# Patient Record
Sex: Female | Born: 1965
Health system: Southern US, Community
[De-identification: ages and names within clinical notes are randomized; demographics above are authoritative.]

## PROBLEM LIST (undated history)

## (undated) DIAGNOSIS — R7302 Impaired glucose tolerance (oral): Secondary | ICD-10-CM

## (undated) DIAGNOSIS — I10 Essential (primary) hypertension: Secondary | ICD-10-CM

## (undated) DIAGNOSIS — E785 Hyperlipidemia, unspecified: Secondary | ICD-10-CM

## (undated) DIAGNOSIS — A539 Syphilis, unspecified: Secondary | ICD-10-CM

## (undated) DIAGNOSIS — Z8619 Personal history of other infectious and parasitic diseases: Secondary | ICD-10-CM

## (undated) HISTORY — DX: Syphilis, unspecified: A53.9

## (undated) HISTORY — DX: Impaired glucose tolerance (oral): R73.02

## (undated) HISTORY — DX: Essential (primary) hypertension: I10

## (undated) HISTORY — DX: Personal history of other infectious and parasitic diseases: Z86.19

## (undated) HISTORY — DX: Hyperlipidemia, unspecified: E78.5

---

## 1989-12-31 HISTORY — PX: TUBAL LIGATION: SHX77

## 1999-06-14 ENCOUNTER — Other Ambulatory Visit: Admission: RE | Admit: 1999-06-14 | Discharge: 1999-06-14 | Payer: Self-pay | Admitting: Obstetrics and Gynecology

## 2000-07-23 ENCOUNTER — Other Ambulatory Visit: Admission: RE | Admit: 2000-07-23 | Discharge: 2000-07-23 | Payer: Self-pay | Admitting: Gynecology

## 2000-08-18 ENCOUNTER — Encounter: Payer: Self-pay | Admitting: Gynecology

## 2000-08-18 ENCOUNTER — Ambulatory Visit (HOSPITAL_COMMUNITY): Admission: RE | Admit: 2000-08-18 | Discharge: 2000-08-18 | Payer: Self-pay | Admitting: Gynecology

## 2001-07-24 ENCOUNTER — Other Ambulatory Visit: Admission: RE | Admit: 2001-07-24 | Discharge: 2001-07-24 | Payer: Self-pay | Admitting: Gynecology

## 2002-07-28 ENCOUNTER — Other Ambulatory Visit: Admission: RE | Admit: 2002-07-28 | Discharge: 2002-07-28 | Payer: Self-pay | Admitting: Gynecology

## 2003-08-23 ENCOUNTER — Other Ambulatory Visit: Admission: RE | Admit: 2003-08-23 | Discharge: 2003-08-23 | Payer: Self-pay | Admitting: Gynecology

## 2004-03-31 ENCOUNTER — Other Ambulatory Visit: Admission: RE | Admit: 2004-03-31 | Discharge: 2004-03-31 | Payer: Self-pay | Admitting: Gynecology

## 2004-10-13 ENCOUNTER — Other Ambulatory Visit: Admission: RE | Admit: 2004-10-13 | Discharge: 2004-10-13 | Payer: Self-pay | Admitting: Gynecology

## 2005-08-07 ENCOUNTER — Ambulatory Visit: Payer: Self-pay | Admitting: Internal Medicine

## 2005-12-31 ENCOUNTER — Encounter (INDEPENDENT_AMBULATORY_CARE_PROVIDER_SITE_OTHER): Payer: Self-pay | Admitting: *Deleted

## 2005-12-31 LAB — CONVERTED CEMR LAB

## 2006-08-30 ENCOUNTER — Ambulatory Visit: Payer: Self-pay | Admitting: Internal Medicine

## 2006-09-27 ENCOUNTER — Ambulatory Visit: Payer: Self-pay | Admitting: Internal Medicine

## 2007-04-03 ENCOUNTER — Ambulatory Visit: Payer: Self-pay | Admitting: Internal Medicine

## 2007-07-07 ENCOUNTER — Ambulatory Visit: Payer: Self-pay | Admitting: Internal Medicine

## 2007-09-13 ENCOUNTER — Encounter: Payer: Self-pay | Admitting: *Deleted

## 2007-09-13 DIAGNOSIS — I10 Essential (primary) hypertension: Secondary | ICD-10-CM | POA: Insufficient documentation

## 2007-09-13 DIAGNOSIS — E669 Obesity, unspecified: Secondary | ICD-10-CM

## 2007-09-13 DIAGNOSIS — Z8619 Personal history of other infectious and parasitic diseases: Secondary | ICD-10-CM

## 2007-09-13 DIAGNOSIS — A539 Syphilis, unspecified: Secondary | ICD-10-CM

## 2007-09-13 HISTORY — DX: Personal history of other infectious and parasitic diseases: Z86.19

## 2007-09-13 HISTORY — DX: Essential (primary) hypertension: I10

## 2007-09-13 HISTORY — DX: Syphilis, unspecified: A53.9

## 2008-04-06 ENCOUNTER — Ambulatory Visit: Payer: Self-pay | Admitting: Internal Medicine

## 2008-04-07 LAB — CONVERTED CEMR LAB
ALT: 28 units/L (ref 0–35)
AST: 24 units/L (ref 0–37)
Albumin: 3.8 g/dL (ref 3.5–5.2)
Alkaline Phosphatase: 72 units/L (ref 39–117)
BUN: 15 mg/dL (ref 6–23)
Basophils Absolute: 0 10*3/uL (ref 0.0–0.1)
Basophils Relative: 0.2 % (ref 0.0–1.0)
Bilirubin Urine: NEGATIVE
Bilirubin, Direct: 0.1 mg/dL (ref 0.0–0.3)
CO2: 33 meq/L — ABNORMAL HIGH (ref 19–32)
Calcium: 9.2 mg/dL (ref 8.4–10.5)
Chloride: 104 meq/L (ref 96–112)
Cholesterol: 196 mg/dL (ref 0–200)
Creatinine, Ser: 1.3 mg/dL — ABNORMAL HIGH (ref 0.4–1.2)
Eosinophils Absolute: 0 10*3/uL (ref 0.0–0.7)
Eosinophils Relative: 0.7 % (ref 0.0–5.0)
GFR calc Af Amer: 58 mL/min
GFR calc non Af Amer: 48 mL/min
Glucose, Bld: 120 mg/dL — ABNORMAL HIGH (ref 70–99)
HCT: 37.8 % (ref 36.0–46.0)
HDL: 47.5 mg/dL (ref >39.0)
Hemoglobin, Urine: NEGATIVE
Hemoglobin: 12.7 g/dL (ref 12.0–15.0)
Hgb A1c MFr Bld: 5.6 % (ref 4.6–6.0)
Ketones, ur: NEGATIVE mg/dL
LDL Cholesterol: 133 mg/dL — ABNORMAL HIGH (ref 0–99)
Leukocytes, UA: NEGATIVE
Lymphocytes Relative: 49.1 % — ABNORMAL HIGH (ref 12.0–46.0)
MCHC: 33.5 g/dL (ref 30.0–36.0)
MCV: 82.8 fL (ref 78.0–100.0)
Monocytes Absolute: 0.2 10*3/uL (ref 0.1–1.0)
Monocytes Relative: 6.1 % (ref 3.0–12.0)
Neutro Abs: 1.6 10*3/uL (ref 1.4–7.7)
Neutrophils Relative %: 43.9 % (ref 43.0–77.0)
Nitrite: NEGATIVE
Platelets: 247 10*3/uL (ref 150–400)
Potassium: 3 meq/L — ABNORMAL LOW (ref 3.5–5.1)
RBC: 4.57 M/uL (ref 3.87–5.11)
RDW: 12.2 % (ref 11.5–14.6)
Sodium: 143 meq/L (ref 135–145)
Specific Gravity, Urine: 1.015 (ref 1.000–1.03)
TSH: 0.63 microintl units/mL (ref 0.35–5.50)
Total Bilirubin: 0.8 mg/dL (ref 0.3–1.2)
Total CHOL/HDL Ratio: 4.1
Total Protein, Urine: NEGATIVE mg/dL
Total Protein: 7 g/dL (ref 6.0–8.3)
Triglycerides: 79 mg/dL (ref 0–149)
Urine Glucose: NEGATIVE mg/dL
Urobilinogen, UA: 0.2 (ref 0.0–1.0)
VLDL: 16 mg/dL (ref 0–40)
WBC: 3.7 10*3/uL — ABNORMAL LOW (ref 4.5–10.5)
pH: 6 (ref 5.0–8.0)

## 2009-03-08 ENCOUNTER — Encounter (INDEPENDENT_AMBULATORY_CARE_PROVIDER_SITE_OTHER): Payer: Self-pay | Admitting: Family Medicine

## 2009-04-26 ENCOUNTER — Ambulatory Visit: Payer: Self-pay | Admitting: Internal Medicine

## 2009-04-26 DIAGNOSIS — E739 Lactose intolerance, unspecified: Secondary | ICD-10-CM | POA: Insufficient documentation

## 2009-04-26 LAB — CONVERTED CEMR LAB
ALT: 19 units/L (ref 0–35)
AST: 20 units/L (ref 0–37)
Albumin: 3.7 g/dL (ref 3.5–5.2)
Alkaline Phosphatase: 79 units/L (ref 39–117)
BUN: 19 mg/dL (ref 6–23)
Basophils Absolute: 0 10*3/uL (ref 0.0–0.1)
Basophils Relative: 0.1 % (ref 0.0–3.0)
Bilirubin Urine: NEGATIVE
Bilirubin, Direct: 0.1 mg/dL (ref 0.0–0.3)
CO2: 32 meq/L (ref 19–32)
Calcium: 8.9 mg/dL (ref 8.4–10.5)
Chloride: 106 meq/L (ref 96–112)
Cholesterol: 189 mg/dL (ref 0–200)
Creatinine, Ser: 1.2 mg/dL (ref 0.4–1.2)
Eosinophils Absolute: 0 10*3/uL (ref 0.0–0.7)
Eosinophils Relative: 0.7 % (ref 0.0–5.0)
GFR calc non Af Amer: 63.25 mL/min (ref >60)
Glucose, Bld: 138 mg/dL — ABNORMAL HIGH (ref 70–99)
HCT: 35.4 % — ABNORMAL LOW (ref 36.0–46.0)
HDL: 45.4 mg/dL (ref >39.00)
Hemoglobin, Urine: NEGATIVE
Hemoglobin: 12.4 g/dL (ref 12.0–15.0)
Hgb A1c MFr Bld: 5.7 % (ref 4.6–6.5)
Ketones, ur: NEGATIVE mg/dL
LDL Cholesterol: 121 mg/dL — ABNORMAL HIGH (ref 0–99)
Leukocytes, UA: NEGATIVE
Lymphocytes Relative: 46.6 % — ABNORMAL HIGH (ref 12.0–46.0)
Lymphs Abs: 2.3 10*3/uL (ref 0.7–4.0)
MCHC: 35.1 g/dL (ref 30.0–36.0)
MCV: 83.6 fL (ref 78.0–100.0)
Monocytes Absolute: 0.3 10*3/uL (ref 0.1–1.0)
Monocytes Relative: 5.8 % (ref 3.0–12.0)
Neutro Abs: 2.2 10*3/uL (ref 1.4–7.7)
Neutrophils Relative %: 46.8 % (ref 43.0–77.0)
Nitrite: NEGATIVE
Platelets: 203 10*3/uL (ref 150.0–400.0)
Potassium: 3.1 meq/L — ABNORMAL LOW (ref 3.5–5.1)
RBC: 4.24 M/uL (ref 3.87–5.11)
RDW: 11.9 % (ref 11.5–14.6)
Sodium: 143 meq/L (ref 135–145)
Specific Gravity, Urine: 1.015 (ref 1.000–1.030)
TSH: 1.51 microintl units/mL (ref 0.35–5.50)
Total Bilirubin: 0.8 mg/dL (ref 0.3–1.2)
Total CHOL/HDL Ratio: 4
Total Protein, Urine: NEGATIVE mg/dL
Total Protein: 6.6 g/dL (ref 6.0–8.3)
Triglycerides: 112 mg/dL (ref 0.0–149.0)
Urine Glucose: NEGATIVE mg/dL
Urobilinogen, UA: 0.2 (ref 0.0–1.0)
VLDL: 22.4 mg/dL (ref 0.0–40.0)
WBC: 4.8 10*3/uL (ref 4.5–10.5)
pH: 6 (ref 5.0–8.0)

## 2009-04-27 ENCOUNTER — Telehealth: Payer: Self-pay | Admitting: Internal Medicine

## 2009-04-27 LAB — CONVERTED CEMR LAB: Vit D, 25-Hydroxy: 43 ng/mL (ref 30–89)

## 2009-09-09 ENCOUNTER — Telehealth (INDEPENDENT_AMBULATORY_CARE_PROVIDER_SITE_OTHER): Payer: Self-pay | Admitting: Internal Medicine

## 2010-06-07 ENCOUNTER — Ambulatory Visit: Payer: Self-pay | Admitting: Internal Medicine

## 2010-06-07 DIAGNOSIS — J019 Acute sinusitis, unspecified: Secondary | ICD-10-CM | POA: Insufficient documentation

## 2010-06-08 LAB — CONVERTED CEMR LAB
ALT: 22 units/L (ref 0–35)
AST: 21 units/L (ref 0–37)
Albumin: 3.9 g/dL (ref 3.5–5.2)
Alkaline Phosphatase: 68 units/L (ref 39–117)
BUN: 17 mg/dL (ref 6–23)
Basophils Absolute: 0 10*3/uL (ref 0.0–0.1)
Basophils Relative: 0.6 % (ref 0.0–3.0)
Bilirubin Urine: NEGATIVE
Bilirubin, Direct: 0.1 mg/dL (ref 0.0–0.3)
CO2: 33 meq/L — ABNORMAL HIGH (ref 19–32)
Calcium: 9 mg/dL (ref 8.4–10.5)
Chloride: 102 meq/L (ref 96–112)
Cholesterol: 179 mg/dL (ref 0–200)
Creatinine, Ser: 1.2 mg/dL (ref 0.4–1.2)
Eosinophils Absolute: 0.1 10*3/uL (ref 0.0–0.7)
Eosinophils Relative: 0.9 % (ref 0.0–5.0)
GFR calc non Af Amer: 62.32 mL/min (ref >60)
Glucose, Bld: 86 mg/dL (ref 70–99)
HCT: 34 % — ABNORMAL LOW (ref 36.0–46.0)
HDL: 36.4 mg/dL — ABNORMAL LOW (ref >39.00)
Hemoglobin, Urine: NEGATIVE
Hemoglobin: 11.9 g/dL — ABNORMAL LOW (ref 12.0–15.0)
Hgb A1c MFr Bld: 5.7 % (ref 4.6–6.5)
Ketones, ur: NEGATIVE mg/dL
LDL Cholesterol: 115 mg/dL — ABNORMAL HIGH (ref 0–99)
Leukocytes, UA: NEGATIVE
Lymphocytes Relative: 40.8 % (ref 12.0–46.0)
Lymphs Abs: 2.8 10*3/uL (ref 0.7–4.0)
MCHC: 35.1 g/dL (ref 30.0–36.0)
MCV: 82.7 fL (ref 78.0–100.0)
Monocytes Absolute: 0.4 10*3/uL (ref 0.1–1.0)
Monocytes Relative: 5.4 % (ref 3.0–12.0)
Neutro Abs: 3.6 10*3/uL (ref 1.4–7.7)
Neutrophils Relative %: 52.3 % (ref 43.0–77.0)
Nitrite: NEGATIVE
Platelets: 280 10*3/uL (ref 150.0–400.0)
Potassium: 3.4 meq/L — ABNORMAL LOW (ref 3.5–5.1)
RBC: 4.11 M/uL (ref 3.87–5.11)
RDW: 12.9 % (ref 11.5–14.6)
Sodium: 144 meq/L (ref 135–145)
Specific Gravity, Urine: 1.02 (ref 1.000–1.030)
TSH: 2.13 microintl units/mL (ref 0.35–5.50)
Total Bilirubin: 0.9 mg/dL (ref 0.3–1.2)
Total CHOL/HDL Ratio: 5
Total Protein, Urine: NEGATIVE mg/dL
Total Protein: 7.5 g/dL (ref 6.0–8.3)
Triglycerides: 140 mg/dL (ref 0.0–149.0)
Urine Glucose: NEGATIVE mg/dL
Urobilinogen, UA: 1 (ref 0.0–1.0)
VLDL: 28 mg/dL (ref 0.0–40.0)
WBC: 6.8 10*3/uL (ref 4.5–10.5)
pH: 6 (ref 5.0–8.0)

## 2011-01-30 NOTE — Assessment & Plan Note (Signed)
Summary: FU  MED  STC   Vital Signs:  Patient profile:   45 year old female Height:      65 inches Weight:      181.75 pounds BMI:     30.35 O2 Sat:      98 % on Room air Temp:     98.5 degrees F oral Pulse rate:   59 / minute BP sitting:   112 / 60  (left arm) Cuff size:   regular  Vitals Entered ByZella Ball Ewing (June 07, 2010 4:29 PM)  O2 Flow:  Room air  CC: Medication refills, congestion/RE   CC:  Medication refills and congestion/RE.  History of Present Illness: here for med refills, incidently with facial pain, pressure, fever  adn greenish d/c;  Pt denies CP, sob, doe, wheezing, orthopnea, pnd, worsening LE edema, palps, dizziness or syncope  Pt denies new neuro symptoms such as headache, facial or extremity weakness   Good compliance with meds, good tolerability.    Preventive Screening-Counseling & Management      Drug Use:  no.    Problems Prior to Update: 1)  Sinusitis- Acute-nos  (ICD-461.9) 2)  Glucose Intolerance  (ICD-271.3) 3)  Preventive Health Care  (ICD-V70.0) 4)  Preventive Health Care  (ICD-V70.0) 5)  Obesity Nos  (ICD-278.00) 6)  Tubal Ligation, Hx of  (ICD-V26.51) 7)  Chlamydial Infection, Hx of  (ICD-V12.09) 8)  Hx of Syphilis  (ICD-097.9) 9)  Hypertension  (ICD-401.9)  Medications Prior to Update: 1)  Azor 5-40 Mg  Tabs (Amlodipine-Olmesartan) .... Take One Tablet Daily 2)  Hydrochlorothiazide 25 Mg  Tabs (Hydrochlorothiazide) .... Take One Tablet Once Daily 3)  Adult Aspirin Low Strength 81 Mg  Tbdp (Aspirin) .Marland Kitchen.. 1 By Mouth Qd 4)  Klor-Con 10 10 Meq  Tbcr (Potassium Chloride) .Marland Kitchen.. 1 By Mouth Once Daily  Current Medications (verified): 1)  Tribenzor 40-5-25 Mg Tabs (Olmesartan-Amlodipine-Hctz) .Marland Kitchen.. 1po Once Daily 2)  Adult Aspirin Low Strength 81 Mg  Tbdp (Aspirin) .Marland Kitchen.. 1 By Mouth Qd 3)  Klor-Con 10 10 Meq  Tbcr (Potassium Chloride) .Marland Kitchen.. 1 By Mouth Once Daily 4)  Azithromycin 250 Mg Tabs (Azithromycin) .... 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         all otherwise negative per pt -    Physical Exam  General:  alert and overweight-appearing.  , mild ill  Head:  normocephalic and atraumatic.   Eyes:  vision grossly intact, pupils equal, and pupils round.   Ears:  bilat tm's mild red, sinus tender bilat Nose:  nasal dischargemucosal pallor and mucosal edema.   Mouth:  pharyngeal erythema and fair dentition.   Neck:  supple and cervical lymphadenopathy.   Lungs:  normal respiratory effort and normal breath sounds.   Heart:  normal rate and regular rhythm.   Abdomen:  soft, non-tender, and normal bowel sounds.   Msk:  no joint tenderness and no joint swelling.   Extremities:  no edema, no erythema  Neurologic:  cranial  nerves II-XII intact and strength normal in all extremities.   Skin:  color normal and no rashes.   Psych:  not anxious appearing and not depressed appearing.     Impression & Recommendations:  Problem # 1:  Preventive Health Care (ICD-V70.0) Overall doing well, age appropriate education and counseling updated and referral for appropriate preventive services done unless declined, immunizations up to date or declined, diet counseling done if overweight, urged to quit smoking if smokes , most recent labs reviewed and current ordered if appropriate, ecg reviewed or declined (interpretation per ECG scanned in the EMR if done); information regarding Medicare Prevention requirements given if appropriate; speciality referrals updated as appropriate  Orders: TLB-BMP (Basic Metabolic Panel-BMET) (80048-METABOL) TLB-CBC Platelet - w/Differential (85025-CBCD) TLB-Hepatic/Liver Function Pnl (80076-HEPATIC) TLB-Lipid Panel (80061-LIPID) TLB-TSH (Thyroid Stimulating Hormone) (84443-TSH) TLB-Udip ONLY (81003-UDIP)  Problem # 2:  HYPERTENSION (ICD-401.9)  The following medications were removed from the medication list:    Hydrochlorothiazide 25 Mg Tabs (Hydrochlorothiazide) .Marland Kitchen... Take one tablet once daily Her updated medication list for this problem includes:    Tribenzor 40-5-25 Mg Tabs (Olmesartan-amlodipine-hctz) .Marland Kitchen... 1po once daily change to the tribenzor  BP today: 112/60 Prior BP: 128/102 (04/26/2009)  Labs Reviewed: K+: 3.1 (04/26/2009) Creat: : 1.2 (04/26/2009)   Chol: 189 (04/26/2009)   HDL: 45.40 (04/26/2009)   LDL: 121 (04/26/2009)   TG: 112.0 (04/26/2009)  Problem # 3:  SINUSITIS- ACUTE-NOS (ICD-461.9)  Her updated medication list for this problem includes:    Azithromycin 250 Mg Tabs (Azithromycin) .Marland Kitchen... 2po qd for 1 day, then 1po qd for 4days, then stop treat as above, f/u any worsening signs or symptoms   Complete Medication List: 1)  Tribenzor 40-5-25 Mg Tabs  (Olmesartan-amlodipine-hctz) .Marland Kitchen.. 1po once daily 2)  Adult Aspirin Low Strength 81 Mg Tbdp (Aspirin) .Marland Kitchen.. 1 by mouth qd 3)  Klor-con 10 10 Meq Tbcr (Potassium chloride) .Marland Kitchen.. 1 by mouth once daily 4)  Azithromycin 250 Mg Tabs (Azithromycin) .... 2po qd for 1 day, then 1po qd for 4days, then stop  Other Orders: TLB-A1C / Hgb A1C (Glycohemoglobin) (83036-A1C)  Patient Instructions: 1)  stop the azor and the HCTZ fluid pill when you run out 2)  after that, start the tribenzor samples, and the new prescriptions 3)  Please go to the Lab in the basement for your blood and/or urine tests today 4)  Please take all new medications as prescribed - the anitbiotic 5)  Continue all previous medications as before this visit except for the above 6)  Please call for your yearly mammogram 7)  Please schedule a follow-up appointment in 1 year or sooner if needed Prescriptions: AZITHROMYCIN 250 MG TABS (AZITHROMYCIN) 2po qd for 1 day, then 1po qd for 4days, then stop  #6 x 1   Entered and Authorized by:   Corwin Levins MD   Signed by:   Corwin Levins MD on 06/07/2010   Method used:   Print then Give to Patient   RxID:  [id number] KLOR-CON 10 10 MEQ  TBCR (POTASSIUM CHLORIDE) 1 by mouth once daily  #90 x 3   Entered and Authorized by:   Corwin Levins MD   Signed by:   Corwin Levins MD on 06/07/2010   Method used:   Print then Give to Patient   RxID:   [id number] TRIBENZOR 40-5-25 MG TABS (OLMESARTAN-AMLODIPINE-HCTZ) 1po once daily  #90 x 3   Entered and Authorized by:   Corwin Levins MD   Signed by:   Corwin Levins MD on 06/07/2010   Method used:   Print then Give to Patient   RxID:   [id number] KGURKYHCW 40-5-25 MG TABS (OLMESARTAN-AMLODIPINE-HCTZ) 1po once daily  #30 x 11   Entered and Authorized by:   Corwin Levins MD   Signed by:   Corwin Levins MD on 06/07/2010   Method used:   Print then Give to Patient   RxID:   [id number]

## 2011-06-30 ENCOUNTER — Other Ambulatory Visit: Payer: Self-pay | Admitting: Internal Medicine

## 2011-07-02 MED ORDER — OLMESARTAN-AMLODIPINE-HCTZ 40-5-25 MG PO TABS: 1.0000 | ORAL_TABLET | Freq: Every day | ORAL | Status: DC

## 2011-07-02 NOTE — Telephone Encounter (Signed)
Rx Done w/ note that Pt needs f/u OV.

## 2011-07-06 ENCOUNTER — Encounter: Payer: Self-pay | Admitting: Internal Medicine

## 2011-07-07 ENCOUNTER — Other Ambulatory Visit: Payer: Self-pay | Admitting: Internal Medicine

## 2011-07-08 ENCOUNTER — Encounter: Payer: Self-pay | Admitting: Internal Medicine

## 2011-07-08 DIAGNOSIS — R7302 Impaired glucose tolerance (oral): Secondary | ICD-10-CM

## 2011-07-08 DIAGNOSIS — E119 Type 2 diabetes mellitus without complications: Secondary | ICD-10-CM | POA: Insufficient documentation

## 2011-07-08 HISTORY — DX: Impaired glucose tolerance (oral): R73.02

## 2011-07-09 ENCOUNTER — Ambulatory Visit (INDEPENDENT_AMBULATORY_CARE_PROVIDER_SITE_OTHER): Payer: Self-pay | Admitting: Internal Medicine

## 2011-07-09 ENCOUNTER — Encounter: Payer: Self-pay | Admitting: Internal Medicine

## 2011-07-09 VITALS — BP 140/92 | HR 54 | Temp 98.0°F | Ht 65.0 in | Wt 203.2 lb

## 2011-07-09 DIAGNOSIS — R7309 Other abnormal glucose: Secondary | ICD-10-CM

## 2011-07-09 DIAGNOSIS — R7302 Impaired glucose tolerance (oral): Secondary | ICD-10-CM

## 2011-07-09 DIAGNOSIS — I10 Essential (primary) hypertension: Secondary | ICD-10-CM

## 2011-07-09 DIAGNOSIS — Z0000 Encounter for general adult medical examination without abnormal findings: Secondary | ICD-10-CM

## 2011-07-09 MED ORDER — POTASSIUM CHLORIDE 10 MEQ PO TBCR: 10.0000 meq | EXTENDED_RELEASE_TABLET | Freq: Every day | ORAL | Status: DC

## 2011-07-09 MED ORDER — OLMESARTAN-AMLODIPINE-HCTZ 40-5-25 MG PO TABS: ORAL_TABLET | ORAL | Status: DC

## 2011-07-09 NOTE — Assessment & Plan Note (Signed)
stable overall by hx and exam, most recent data reviewed with pt, and pt to continue medical treatment as before  Lab Results  Component Value Date   HGBA1C 5.7 06/07/2010

## 2011-07-09 NOTE — Progress Notes (Signed)
  Subjective:    Patient ID: Martha Alvarado, female    DOB: [birth year], 45 y.o.   MRN: [id number]  HPI Here to f/u; overall doing ok,  Pt denies chest pain, increased sob or doe, wheezing, orthopnea, PND, increased LE swelling, palpitations, dizziness or syncope.  Pt denies new neurological symptoms such as new headache, or facial or extremity weakness or numbness   Pt denies polydipsia, polyuria,   Pt states overall good compliance with meds, trying to follow lower cholesterol wt overall stable but little exercise however.  Pt denies fever, wt loss, night sweats, loss of appetite, or other constitutional symptoms  Overall good compliance with treatment, and good medicine tolerability, except did run out of med in the last 2 days. Past Medical History  Diagnosis Date  . CHLAMYDIAL INFECTION, HX OF 09/13/2007  . GLUCOSE INTOLERANCE 04/26/2009  . HYPERTENSION 09/13/2007  . OBESITY NOS 09/13/2007  . SINUSITIS- ACUTE-NOS 06/07/2010  . SYPHILIS 09/13/2007  . Impaired glucose tolerance 07/08/2011   Past Surgical History  Procedure Date  . Tubal ligation     reports that she has never smoked. She does not have any smokeless tobacco history on file. She reports that she does not drink alcohol or use illicit drugs. family history includes Diabetes in her other and Hypertension in her other. No Known Allergies Current Outpatient Prescriptions on File Prior to Visit  Medication Sig Dispense Refill  . aspirin 81 MG tablet Take 81 mg by mouth daily.        Marland Kitchen DISCONTD: potassium chloride (KLOR-CON) 10 MEQ CR tablet Take 10 mEq by mouth daily.        Marland Kitchen DISCONTD: TRIBENZOR 40-5-25 MG TABS TAKE 1 TABLET BY MOUTH ONCE DAILY  30 tablet  1   Review of Systems Review of Systems  Constitutional: Negative for diaphoresis and unexpected weight change.  HENT: Negative for drooling and tinnitus.   Eyes: Negative for photophobia and visual disturbance.  Respiratory: Negative for choking and stridor.     Gastrointestinal: Negative for vomiting and blood in stool.  Genitourinary: Negative for hematuria and decreased urine volume.        Objective:   Physical Exam BP 140/92  Pulse 54  Temp(Src) 98 F (36.7 C) (Oral)  Ht 5\' 5"  (1.651 m)  Wt 203 lb 4 oz (92.194 kg)  BMI 33.82 kg/m2  SpO2 98% Physical Exam  VS noted Constitutional: Pt appears well-developed and obses  HENT: Head: Normocephalic.  Right Ear: External ear normal.  Left Ear: External ear normal.  Eyes: Conjunctivae and EOM are normal. Pupils are equal, round, and reactive to light.  Neck: Normal range of motion. Neck supple.  Cardiovascular: Normal rate and regular rhythm.   Pulmonary/Chest: Effort normal and breath sounds normal.  Abd:  Soft, NT, non-distended, + BS Neurological: Pt is alert. No cranial nerve deficit.  Skin: Skin is warm. No erythema.  Psychiatric: Pt behavior is normal. Thought content normal.         Assessment & Plan:

## 2011-07-09 NOTE — Assessment & Plan Note (Signed)
stable overall by hx and exam, most recent data reviewed with pt, and pt to continue medical treatment as before  Lab Results  Component Value Date   WBC 6.8 06/07/2010   HGB 11.9* 06/07/2010   HCT 34.0* 06/07/2010   PLT 280.0 06/07/2010   CHOL 179 06/07/2010   TRIG 140.0 06/07/2010   HDL 36.40* 06/07/2010   ALT 22 06/07/2010   AST 21 06/07/2010   NA 144 06/07/2010   K 3.4* 06/07/2010   CL 102 06/07/2010   CREATININE 1.2 06/07/2010   BUN 17 06/07/2010   CO2 33* 06/07/2010   TSH 2.13 06/07/2010   HGBA1C 5.7 06/07/2010

## 2011-07-09 NOTE — Patient Instructions (Signed)
You are given the BP medication samples today  - 1 month, and prescription Continue all other medications as before Please return in 3 mo with Lab testing done 3-5 days before

## 2011-10-11 ENCOUNTER — Ambulatory Visit: Payer: Self-pay | Admitting: Internal Medicine

## 2011-10-29 ENCOUNTER — Ambulatory Visit: Payer: Self-pay | Admitting: Internal Medicine

## 2011-11-27 ENCOUNTER — Ambulatory Visit: Payer: Self-pay | Admitting: Internal Medicine

## 2011-12-17 ENCOUNTER — Ambulatory Visit: Payer: Self-pay | Admitting: Internal Medicine

## 2012-02-21 ENCOUNTER — Emergency Department (INDEPENDENT_AMBULATORY_CARE_PROVIDER_SITE_OTHER)
Admission: EM | Admit: 2012-02-21 | Discharge: 2012-02-21 | Disposition: A | Discharge status: Home Health | Payer: Self-pay | Source: Home / Self Care | Attending: Family Medicine | Admitting: Family Medicine

## 2012-02-21 ENCOUNTER — Encounter (HOSPITAL_COMMUNITY): Payer: Self-pay | Admitting: Emergency Medicine

## 2012-02-21 DIAGNOSIS — N390 Urinary tract infection, site not specified: Secondary | ICD-10-CM

## 2012-02-21 DIAGNOSIS — N760 Acute vaginitis: Secondary | ICD-10-CM

## 2012-02-21 LAB — POCT URINALYSIS DIP (DEVICE)
Protein, ur: 100 mg/dL — AB
Specific Gravity, Urine: 1.025 (ref 1.005–1.030)
Urobilinogen, UA: 1 mg/dL (ref 0.0–1.0)
pH: 6 (ref 5.0–8.0)

## 2012-02-21 LAB — CBC
Hemoglobin: 13.5 g/dL (ref 12.0–15.0)
RBC: 4.7 MIL/uL (ref 3.87–5.11)
WBC: 5.7 10*3/uL (ref 4.0–10.5)

## 2012-02-21 LAB — WET PREP, GENITAL: Clue Cells Wet Prep HPF POC: NONE SEEN

## 2012-02-21 MED ORDER — CIPROFLOXACIN HCL 750 MG PO TABS: 750.0000 mg | ORAL_TABLET | Freq: Two times a day (BID) | ORAL | Status: AC

## 2012-02-21 MED ORDER — IBUPROFEN 800 MG PO TABS
800.0000 mg | ORAL_TABLET | Freq: Once | ORAL | Status: AC
  Administered 2012-02-21: 800 mg via ORAL

## 2012-02-21 MED ORDER — IBUPROFEN 800 MG PO TABS
ORAL_TABLET | ORAL | Status: AC
  Filled 2012-02-21: qty 1

## 2012-02-21 MED ORDER — METRONIDAZOLE 500 MG PO TABS: 500.0000 mg | ORAL_TABLET | Freq: Two times a day (BID) | ORAL | Status: AC

## 2012-02-21 MED ORDER — CEFTRIAXONE SODIUM 1 G IJ SOLR
1.0000 g | Freq: Once | INTRAMUSCULAR | Status: AC
  Administered 2012-02-21: 1 g via INTRAMUSCULAR

## 2012-02-21 MED ORDER — CEFTRIAXONE SODIUM 1 G IJ SOLR
INTRAMUSCULAR | Status: AC
  Filled 2012-02-21: qty 10

## 2012-02-21 NOTE — ED Provider Notes (Cosign Needed Addendum)
History     CSN: [id number]  Arrival date & time 02/21/12  1059   First MD Initiated Contact with Patient 02/21/12 1135      Chief Complaint  Patient presents with  . URI  . Vaginal Discharge    (Consider location/radiation/quality/duration/timing/severity/associated sxs/prior treatment) HPI Comments: The patient reports having runny nose and congestion x 3 days. Developed a headache yesterday. No vomiting. No blurred vision. Admits to cough that is dry. Fever started today. Denies uti symptoms but does not a vaginal discharge x 3-4 days. Is sexually active.   The history is provided by the patient.    Past Medical History  Diagnosis Date  . CHLAMYDIAL INFECTION, HX OF 09/13/2007  . GLUCOSE INTOLERANCE 04/26/2009  . HYPERTENSION 09/13/2007  . OBESITY NOS 09/13/2007  . SINUSITIS- ACUTE-NOS 06/07/2010  . SYPHILIS 09/13/2007  . Impaired glucose tolerance 07/08/2011    Past Surgical History  Procedure Date  . Tubal ligation     Family History  Problem Relation Age of Onset  . Diabetes Other   . Hypertension Other     History  Substance Use Topics  . Smoking status: Never Smoker   . Smokeless tobacco: Not on file  . Alcohol Use: No    OB History    Grav Para Term Preterm Abortions TAB SAB Ect Mult Living                  Review of Systems  Constitutional: Positive for fever and fatigue.  HENT: Positive for congestion, rhinorrhea, postnasal drip and sinus pressure. Negative for sore throat.   Respiratory: Positive for cough. Negative for wheezing.   Gastrointestinal: Negative.   Genitourinary: Positive for vaginal discharge and vaginal pain.  Musculoskeletal: Positive for arthralgias.  Skin: Negative.   Neurological: Positive for headaches.    Allergies  Review of patient's allergies indicates no known allergies.  Home Medications   Current Outpatient Rx  Name Route Sig Dispense Refill  . ASPIRIN 81 MG PO TABS Oral Take 81 mg by mouth daily.      Marland Kitchen  CIPROFLOXACIN HCL 750 MG PO TABS Oral Take 1 tablet (750 mg total) by mouth 2 (two) times daily. 14 tablet 0  . METRONIDAZOLE 500 MG PO TABS Oral Take 1 tablet (500 mg total) by mouth 2 (two) times daily. 14 tablet 0  . OLMESARTAN-AMLODIPINE-HCTZ 40-5-25 MG PO TABS  1 tab by mouth per day 90 tablet 3    Overdue for yearly physical must see md before add ...  . POTASSIUM CHLORIDE 10 MEQ PO TBCR Oral Take 1 tablet (10 mEq total) by mouth daily. 90 tablet 3    BP 124/71  Pulse 113  Temp(Src) 102 F (38.9 C) (Oral)  Resp 17  SpO2 100%  Physical Exam  Nursing note and vitals reviewed. Constitutional: She appears well-developed and well-nourished.  HENT:  Head: Normocephalic and atraumatic.       Ears clear, nose congested, frontal and maxillary tenderness. Throat clear  Neck: Normal range of motion. Neck supple. No thyromegaly present.  Cardiovascular: Regular rhythm and normal heart sounds.        Tachy due to fever  Pulmonary/Chest: Effort normal and breath sounds normal. She has no wheezes.  Abdominal: Soft. Bowel sounds are normal. She exhibits no distension. There is no tenderness. There is no rebound and no guarding.  Genitourinary:       Pelvic exam with female nursing personal savannah assisting reveals no skin or vulvar lesions. Thin frothy  white discharge. petechia on cervix. No cmt. No adenexal mass.   Lymphadenopathy:    She has no cervical adenopathy.    ED Course  Procedures (including critical care time)  Labs Reviewed  POCT URINALYSIS DIP (DEVICE) - Abnormal; Notable for the following:    Ketones, ur TRACE (*)    Hgb urine dipstick MODERATE (*)    Protein, ur 100 (*)    Leukocytes, UA LARGE (*) Biochemical Testing Only. Please order routine urinalysis from main lab if confirmatory testing is needed.   All other components within normal limits  WET PREP, GENITAL  GC/CHLAMYDIA PROBE AMP, GENITAL  URINE CULTURE  CBC   No results found.   1. UTI (lower urinary  tract infection)   2. Vaginitis       MDM        Randa Spike, MD 02/21/12 1344  Randa Spike, MD 02/21/12 [id number]

## 2012-02-21 NOTE — ED Notes (Signed)
PT HERE WITH COLD SX THAT STARTED X 3 DYS AGO WITH COUGH ,WHITE PHLEGM,CHILLSAND BODY ACHES AND TODAY FEVER 102.0.PT ALSO C/O YELLOWISH VAG D/C WITH ODOR AND LOWER BACK PAIN.DENIES N/V.LMP X 5 YRS AGO.PT HAS BEEN TAKING OTC COLD/COUGH MEDS BUT NOT WORKING

## 2012-02-21 NOTE — Discharge Instructions (Signed)
Tylenol or motrin as needed. Avoid caffeine and milk products. No intercourse x 10 days. Avoid caffeine and milk products. Will contact you with lab results and any new instructions if indicated. Cervicitis Cervicitis is a soreness and swelling (inflammation) of the cervix. Your cervix is located at the bottom of your uterus which opens up to the vagina.  CAUSES   Sexually transmitted infections (STIs).   Allergic reaction.   Medicines or birth control devices that are put in the vagina.   Injury to the cervix.   Bacterial infections.  SYMPTOMS  There may be no symptoms. If symptoms occur, they may include:  Grey, white, yellow, or bad smelling vaginal discharge.   Pain or itching of the area outside the vagina.   Painful sexual intercourse.   Lower abdominal or lower back pain, especially during intercourse.   Frequent urination.   Abnormal vaginal bleeding between periods, after sexual intercourse, or after menopause.   Pressure or a heavy feeling in the pelvis.  DIAGNOSIS  Diagnosis is made after a pelvic exam. Other tests may include:  Examination of any discharge under a microscope (wet prep).   A Pap test.  TREATMENT  Treatment will depend on the cause of cervicitis. If it is caused by an STI, both you and your partner will need to be treated. Antibiotic medicines will be given. HOME CARE INSTRUCTIONS   Do not have sexual intercourse until your caregiver says it is okay.   Do not have sexual intercourse until your partner has been treated if your cervicitis is caused by an STI.   Take your antibiotics as directed. Finish them even if you start to feel better.  SEEK IMMEDIATE MEDICAL CARE IF:   Your symptoms come back.   You have a fever.   You experience any problems that may be related to the medicine you are taking.  MAKE SURE YOU:   Understand these instructions.   Will watch your condition.   Will get help right away if you are not doing well or get  worse.  Document Released: 12/17/2005 Document Revised: 08/29/2011 Document Reviewed: 07/16/2011 [facility] Patient Information 2012 [place], [place].Cervicitis Cervicitis is a soreness and swelling (inflammation) of the cervix. Your cervix is located at the bottom of your uterus which opens up to the vagina.  CAUSES   Sexually transmitted infections (STIs).   Allergic reaction.   Medicines or birth control devices that are put in the vagina.   Injury to the cervix.   Bacterial infections.  SYMPTOMS  There may be no symptoms. If symptoms occur, they may include:  Grey, white, yellow, or bad smelling vaginal discharge.   Pain or itching of the area outside the vagina.   Painful sexual intercourse.   Lower abdominal or lower back pain, especially during intercourse.   Frequent urination.   Abnormal vaginal bleeding between periods, after sexual intercourse, or after menopause.   Pressure or a heavy feeling in the pelvis.  DIAGNOSIS  Diagnosis is made after a pelvic exam. Other tests may include:  Examination of any discharge under a microscope (wet prep).   A Pap test.  TREATMENT  Treatment will depend on the cause of cervicitis. If it is caused by an STI, both you and your partner will need to be treated. Antibiotic medicines will be given. HOME CARE INSTRUCTIONS   Do not have sexual intercourse until your caregiver says it is okay.   Do not have sexual intercourse until your partner has been treated if  your cervicitis is caused by an STI.   Take your antibiotics as directed. Finish them even if you start to feel better.  SEEK IMMEDIATE MEDICAL CARE IF:   Your symptoms come back.   You have a fever.   You experience any problems that may be related to the medicine you are taking.  MAKE SURE YOU:   Understand these instructions.   Will watch your condition.   Will get help right away if you are not doing well or get worse.  Document Released: 12/17/2005 Document  Revised: 08/29/2011 Document Reviewed: 07/16/2011 [facility] Patient Information 2012 [place], [place].

## 2012-02-22 ENCOUNTER — Telehealth (HOSPITAL_COMMUNITY): Payer: Self-pay | Admitting: *Deleted

## 2012-02-22 LAB — URINE CULTURE: Culture: NO GROWTH

## 2012-02-22 LAB — GC/CHLAMYDIA PROBE AMP, GENITAL: Chlamydia, DNA Probe: NEGATIVE

## 2012-02-22 NOTE — ED Notes (Signed)
GC/Chlamydia neg., Wet prep: Many trich, many WBC's, Urine culture: no growth. I called pt. and verified x 2. Pt. given results and told she was adequately treated with Flagyl. Pt. instructed: You need to notify your partner to be treated, no sex until you have finished your medication and your partner has been treated and to practice safe sex.  Pt. voiced understanding. Vassie Moselle 02/22/2012

## 2012-05-01 ENCOUNTER — Encounter: Payer: Self-pay | Admitting: Gynecology

## 2012-05-01 ENCOUNTER — Ambulatory Visit (INDEPENDENT_AMBULATORY_CARE_PROVIDER_SITE_OTHER): Payer: BC Managed Care – PPO | Admitting: Gynecology

## 2012-05-01 ENCOUNTER — Other Ambulatory Visit (HOSPITAL_COMMUNITY)
Admission: RE | Admit: 2012-05-01 | Discharge: 2012-05-01 | Disposition: A | Discharge status: Home / Self Care | Payer: BC Managed Care – PPO | Source: Ambulatory Visit | Attending: Gynecology | Admitting: Gynecology

## 2012-05-01 VITALS — BP 128/84 | Ht 64.5 in | Wt 193.0 lb

## 2012-05-01 DIAGNOSIS — Z01419 Encounter for gynecological examination (general) (routine) without abnormal findings: Secondary | ICD-10-CM | POA: Insufficient documentation

## 2012-05-01 DIAGNOSIS — E28319 Asymptomatic premature menopause: Secondary | ICD-10-CM

## 2012-05-01 LAB — FOLLICLE STIMULATING HORMONE: FSH: 33.7 m[IU]/mL

## 2012-05-01 LAB — PROLACTIN: Prolactin: 7 ng/mL

## 2012-05-01 NOTE — Patient Instructions (Addendum)
Cholesterol Control Diet  Cholesterol levels in your body are determined significantly by your diet. Cholesterol levels may also be related to heart disease. The following material helps to explain this relationship and discusses what you can do to help keep your heart healthy. Not all cholesterol is bad. Low-density lipoprotein (LDL) cholesterol is the "bad" cholesterol. It may cause fatty deposits to build up inside your arteries. High-density lipoprotein (HDL) cholesterol is "good." It helps to remove the "bad" LDL cholesterol from your blood. Cholesterol is a very important risk factor for heart disease. Other risk factors are high blood pressure, smoking, stress, heredity, and weight. The heart muscle gets its supply of blood through the coronary arteries. If your LDL cholesterol is high and your HDL cholesterol is low, you are at risk for having fatty deposits build up in your coronary arteries. This leaves less room through which blood can flow. Without sufficient blood and oxygen, the heart muscle cannot function properly and you may feel chest pains (angina pectoris). When a coronary artery closes up entirely, a part of the heart muscle may die, causing a heart attack (myocardial infarction). CHECKING CHOLESTEROL When your caregiver sends your blood to a lab to be analyzed for cholesterol, a complete lipid (fat) profile may be done. With this test, the total amount of cholesterol and levels of LDL and HDL are determined. Triglycerides are a type of fat that circulates in the blood and can also be used to determine heart disease risk. The list below describes what the numbers should be: Test: Total Cholesterol.  Less than 200 mg/dl.  Test: LDL "bad cholesterol."  Less than 100 mg/dl.   Less than 70 mg/dl if you are at very high risk of a heart attack or sudden cardiac death.  Test: HDL "good cholesterol."  Greater than 50 mg/dl for women.    Greater than 40 mg/dl for men.  Test: Triglycerides.  Less than 150 mg/dl.  CONTROLLING CHOLESTEROL WITH DIET Although exercise and lifestyle factors are important, your diet is key. That is because certain foods are known to raise cholesterol and others to lower it. The goal is to balance foods for their effect on cholesterol and more importantly, to replace saturated and trans fat with other types of fat, such as monounsaturated fat, polyunsaturated fat, and omega-3 fatty acids. On average, a person should consume no more than 15 to 17 g of saturated fat daily. Saturated and trans fats are considered "bad" fats, and they will raise LDL cholesterol. Saturated fats are primarily found in animal products such as meats, butter, and cream. However, that does not mean you need to sacrifice all your favorite foods. Today, there are good tasting, low-fat, low-cholesterol substitutes for most of the things you like to eat. Choose low-fat or nonfat alternatives. Choose round or loin cuts of red meat, since these types of cuts are lowest in fat and cholesterol. Chicken (without the skin), fish, veal, and ground [place] breast are excellent choices. Eliminate fatty meats, such as hot dogs and salami. Even shellfish have little or no saturated fat. Have a 3 oz (85 g) portion when you eat lean meat, poultry, or fish. Trans fats are also called "partially hydrogenated oils." They are oils that have been scientifically manipulated so that they are solid at room temperature resulting in a longer shelf life and improved taste and texture of foods in which they are added. Trans fats are found in stick margarine, some tub margarines, cookies, crackers, and baked goods.  When baking and cooking, oils are an excellent substitute for butter. The monounsaturated oils are especially beneficial since it is believed they lower LDL and raise HDL. The oils you should avoid entirely are saturated tropical oils, such as coconut and  palm.  Remember to eat liberally from food groups that are naturally free of saturated and trans fat, including fish, fruit, vegetables, beans, grains (barley, rice, couscous, bulgur wheat), and pasta (without cream sauces).  IDENTIFYING FOODS THAT LOWER CHOLESTEROL  Soluble fiber may lower your cholesterol. This type of fiber is found in fruits such as apples, vegetables such as broccoli, potatoes, and carrots, legumes such as beans, peas, and lentils, and grains such as barley. Foods fortified with plant sterols (phytosterol) may also lower cholesterol. You should eat at least 2 g per day of these foods for a cholesterol lowering effect.  Read package labels to identify low-saturated fats, trans fats free, and low-fat foods at the supermarket. Select cheeses that have only 2 to 3 g saturated fat per ounce. Use a heart-healthy tub margarine that is free of trans fats or partially hydrogenated oil. When buying baked goods (cookies, crackers), avoid partially hydrogenated oils. Breads and muffins should be made from whole grains (whole-wheat or whole oat flour, instead of "flour" or "enriched flour"). Buy non-creamy canned soups with reduced salt and no added fats.  FOOD PREPARATION TECHNIQUES  Never deep-fry. If you must fry, either stir-fry, which uses very little fat, or use non-stick cooking sprays. When possible, broil, bake, or roast meats, and steam vegetables. Instead of dressing vegetables with butter or margarine, use lemon and herbs, applesauce and cinnamon (for squash and sweet potatoes), nonfat yogurt, salsa, and low-fat dressings for salads.  LOW-SATURATED FAT / LOW-FAT FOOD SUBSTITUTES Meats / Saturated Fat (g)  Avoid: Steak, marbled (3 oz/85 g) / 11 g   Choose: Steak, lean (3 oz/85 g) / 4 g   Avoid: Hamburger (3 oz/85 g) / 7 g   Choose: Hamburger, lean (3 oz/85 g) / 5 g   Avoid: Ham (3 oz/85 g) / 6 g   Choose: Ham, lean cut (3 oz/85 g) / 2.4 g   Avoid: Chicken, with skin, dark  meat (3 oz/85 g) / 4 g   Choose: Chicken, skin removed, dark meat (3 oz/85 g) / 2 g   Avoid: Chicken, with skin, light meat (3 oz/85 g) / 2.5 g   Choose: Chicken, skin removed, light meat (3 oz/85 g) / 1 g  Dairy / Saturated Fat (g)  Avoid: Whole milk (1 cup) / 5 g   Choose: Low-fat milk, 2% (1 cup) / 3 g   Choose: Low-fat milk, 1% (1 cup) / 1.5 g   Choose: Skim milk (1 cup) / 0.3 g   Avoid: Hard cheese (1 oz/28 g) / 6 g   Choose: Skim milk cheese (1 oz/28 g) / 2 to 3 g   Avoid: Cottage cheese, 4% fat (1 cup) / 6.5 g   Choose: Low-fat cottage cheese, 1% fat (1 cup) / 1.5 g   Avoid: Ice cream (1 cup) / 9 g   Choose: Sherbet (1 cup) / 2.5 g   Choose: Nonfat frozen yogurt (1 cup) / 0.3 g   Choose: Frozen fruit bar / trace   Avoid: Whipped cream (1 tbs) / 3.5 g   Choose: Nondairy whipped topping (1 tbs) / 1 g  Condiments / Saturated Fat (g)  Avoid: Mayonnaise (1 tbs) / 2 g   Choose: Low-fat  mayonnaise (1 tbs) / 1 g   Avoid: Butter (1 tbs) / 7 g   Choose: Extra light margarine (1 tbs) / 1 g   Avoid: Coconut oil (1 tbs) / 11.8 g   Choose: Olive oil (1 tbs) / 1.8 g   Choose: Corn oil (1 tbs) / 1.7 g   Choose: Safflower oil (1 tbs) / 1.2 g   Choose: Sunflower oil (1 tbs) / 1.4 g   Choose: Soybean oil (1 tbs) / 2.4 g   Choose: Canola oil (1 tbs) / 1 g  Document Released: 12/17/2005 Document Revised: 08/29/2011 Document Reviewed: 06/07/2011 [facility] Patient Information 2012 [place], [place].  Exercise to Lose Weight    Exercise and a healthy diet may help you lose weight. Your doctor may suggest specific exercises. EXERCISE IDEAS AND TIPS  Choose low-cost things you enjoy doing, such as walking, bicycling, or exercising to workout videos.   Take stairs instead of the elevator.   Walk during your lunch break.   Park your car further away from work or school.   Go to a gym or an exercise class.   Start with 5 to 10 minutes of exercise each day. Build  up to 30 minutes of exercise 4 to 6 days a week.   Wear shoes with good support and comfortable clothes.   Stretch before and after working out.   Work out until you breathe harder and your heart beats faster.   Drink extra water when you exercise.   Do not do so much that you hurt yourself, feel dizzy, or get very short of breath.  Exercises that burn about 150 calories:  Running 1  miles in 15 minutes.   Playing volleyball for 45 to 60 minutes.   Washing and waxing a car for 45 to 60 minutes.   Playing touch football for 45 minutes.   Walking 1  miles in 35 minutes.   Pushing a stroller 1  miles in 30 minutes.   Playing basketball for 30 minutes.   Raking leaves for 30 minutes.   Bicycling 5 miles in 30 minutes.   Walking 2 miles in 30 minutes.   Dancing for 30 minutes.   Shoveling snow for 15 minutes.   Swimming laps for 20 minutes.   Walking up stairs for 15 minutes.   Bicycling 4 miles in 15 minutes.   Gardening for 30 to 45 minutes.   Jumping rope for 15 minutes.   Washing windows or floors for 45 to 60 minutes.  Document Released: 01/19/2011 Document Revised: 08/29/2011 Document Reviewed: 01/19/2011 [employer] Patient Information 2012 [employer], [place]  Menopause Menopause is the normal time of life when menstrual periods stop completely. Menopause is complete when you have missed 12 consecutive menstrual periods. It usually occurs between the ages of 36 to 26, with an average age of 33. Very rarely does a woman develop menopause before 46 years old. At menopause, your ovaries stop producing the female hormones, estrogen and progesterone. This can cause undesirable symptoms and also affect your health. Sometimes the symptoms may occur 4 to 5 years before the menopause begins. There is no relationship between menopause and:  Oral contraceptives.   Number of children you had.   Race.   The age your menstrual periods started (menarche).  Heavy smokers  and very thin women may develop menopause earlier in life. CAUSES  The ovaries stop producing the female hormones estrogen and progesterone.   Other causes include:   Surgery to remove  both ovaries.   The ovaries stop functioning for no known reason.   Tumors of the pituitary gland in the brain.   Medical disease that affects the ovaries and hormone production.   Radiation treatment to the abdomen or pelvis.   Chemotherapy that affects the ovaries.  SYMPTOMS   Hot flashes.   Night sweats.   Decrease in sex drive.   Vaginal dryness and thinning of the vagina causing painful intercourse.   Dryness of the skin and developing wrinkles.   Headaches.   Tiredness.   Irritability.   Memory problems.   Weight gain.   Bladder infections.   Hair growth of the face and chest.   Infertility.  More serious symptoms include:  Loss of bone (osteoporosis) causing breaks (fractures).   Depression.   Hardening and narrowing of the arteries (atherosclerosis) causing heart attacks and strokes.  DIAGNOSIS   When the menstrual periods have stopped for 12 straight months.   Physical exam.   Hormone studies of the blood.  TREATMENT  There are many treatment choices and nearly as many questions about them. The decisions to treat or not to treat menopausal changes is an individual choice made with your caregiver. Your caregiver can discuss the treatments with you. Together, you can decide which treatment will work best for you. Your treatment choices may include:   Hormone therapy (estorgen and progesterone).   Non-hormonal medications.   Treating the individual symptoms with medication (for example antidepressants for depression).   Herbal medications that may help specific symptoms.   Counseling by a psychiatrist or psychologist.   Group therapy.   Lifestyle changes including:   Eating healthy.   Regular exercise.   Limiting caffeine and alcohol.   Stress  management and meditation.   No treatment.  HOME CARE INSTRUCTIONS   Take the medication your caregiver gives you as directed.   Get plenty of sleep and rest.   Exercise regularly.   Eat a diet that contains calcium (good for the bones) and soy products (acts like estrogen hormone).   Avoid alcoholic beverages.   Do not smoke.   If you have hot flashes, dress in layers.   Take supplements, calcium and vitamin D to strengthen bones.   You can use over-the-counter lubricants or moisturizers for vaginal dryness.   Group therapy is sometimes very helpful.   Acupuncture may be helpful in some cases.  SEEK MEDICAL CARE IF:   You are not sure you are in menopause.   You are having menopausal symptoms and need advice and treatment.   You are still having menstrual periods after age 8.   You have pain with intercourse.   Menopause is complete (no menstrual period for 12 months) and you develop vaginal bleeding.   You need a referral to a specialist (gynecologist, psychiatrist or psychologist) for treatment.  SEEK IMMEDIATE MEDICAL CARE IF:   You have severe depression.   You have excessive vaginal bleeding.   You fell and think you have a broken bone.   You have pain when you urinate.   You develop leg or chest pain.   You have a fast pounding heart beat (palpitations).   You have severe headaches.   You develop vision problems.   You feel a lump in your breast.   You have abdominal pain or severe indigestion.  Document Released: 03/08/2004 Document Revised: 12/06/2011 Document Reviewed: 10/14/2008 [facility] Patient Information 2012 [place], [place].  Hormone Therapy At menopause, your body begins making less  estrogen and progesterone hormones. This causes the body to stop having menstrual periods. This is because estrogen and progesterone hormones control your periods and menstrual cycle. A lack of estrogen may cause symptoms such as:  Hot flushes (or hot  flashes).   Vaginal dryness.   Dry skin.   Loss of sex drive.   Risk of bone loss (osteoporosis).  When this happens, you may choose to take hormone therapy to get back the estrogen lost during menopause. When the hormone estrogen is given alone, it is usually referred to as ET (Estrogen Therapy). When the hormone progestin is combined with estrogen, it is generally called HT (Hormone Therapy). This was formerly known as hormone replacement therapy (HRT). Your caregiver can help you make a decision on what will be best for you. The decision to use HT seems to change often as new studies are done. Many studies do not agree on the benefits of hormone replacement therapy. LIKELY BENEFITS OF HT INCLUDE PROTECTION FROM:  Hot Flushes (also called hot flashes) - A hot flush is a sudden feeling of heat that spreads over the face and body. The skin may redden like a blush. It is connected with sweats and sleep disturbance. Women going through menopause may have hot flushes a few times a month or several times per day depending on the woman.   Osteoporosis (bone loss)- Estrogen helps guard against bone loss. After menopause, a woman's bones slowly lose calcium and become weak and brittle. As a result, bones are more likely to break. The hip, wrist, and spine are affected most often. Hormone therapy can help slow bone loss after menopause. Weight bearing exercise and taking calcium with vitamin D also can help prevent bone loss. There are also medications that your caregiver can prescribe that can help prevent osteoporosis.   Vaginal Dryness - Loss of estrogen causes changes in the vagina. Its lining may become thin and dry. These changes can cause pain and bleeding during sexual intercourse. Dryness can also lead to infections. This can cause burning and itching. (Vaginal estrogen treatment can help relieve pain, itching, and dryness.)   Urinary Tract Infections are more common after menopause because of  lack of estrogen. Some women also develop urinary incontinence because of low estrogen levels in the vagina and bladder.   Possible other benefits of estrogen include a positive effect on mood and short-term memory in women.  RISKS AND COMPLICATIONS  Using estrogen alone without progesterone causes the lining of the uterus to grow. This increases the risk of lining of the uterus (endometrial) cancer. Your caregiver should give another hormone called progestin if you have a uterus.   Women who take combined (estrogen and progestin) HT appear to have an increased risk of breast cancer. The risk appears to be small, but increases throughout the time that HT is taken.   Combined therapy also makes the breast tissue slightly denser which makes it harder to read mammograms (breast X-rays).   Combined, estrogen and progesterone therapy can be taken together every day, in which case there may be spotting of blood. HT therapy can be taken cyclically in which case you will have menstrual periods. Cyclically means HT is taken for a set amount of days, then not taken, then this process is repeated.   HT may increase the risk of stroke, heart attack, breast cancer and forming blood clots in your leg.   Transdermal estrogen (estrogen that is absorbed through the skin with a patch or a  cream) may have more positive results with:   Cholesterol.   Blood pressure.   Blood clots.  Having the following conditions may indicate you should not have HT:  Endometrial cancer.   Liver disease.   Breast cancer.   Heart disease.   History of blood clots.   Stroke.  TREATMENT   If you choose to take HT and have a uterus, usually estrogen and progestin are prescribed.   Your caregiver will help you decide the best way to take the medications.   Possible ways to take estrogen include:   Pills.   Patches.   Gels.   Sprays.   Vaginal estrogen cream, rings and tablets.   It is best to take the  lowest dose possible that will help your symptoms and take them for the shortest period of time that you can.   Hormone therapy can help relieve some of the problems (symptoms) that affect women at menopause. Before making a decision about HT, talk to your caregiver about what is best for you. Be well informed and comfortable with your decisions.  HOME CARE INSTRUCTIONS   Follow your caregivers advice when taking the medications.   A Pap test is done to screen for cervical cancer.   The first Pap test should be done at age 25.   Between ages 72 and 72, Pap tests are repeated every 2 years.   Beginning at age 76, you are advised to have a Pap test every 3 years as long as your past 3 Pap tests have been normal.   Some women have medical problems that increase the chance of getting cervical cancer. Talk to your caregiver about these problems. It is especially important to talk to your caregiver if a new problem develops soon after your last Pap test. In these cases, your caregiver may recommend more frequent screening and Pap tests.   The above recommendations are the same for women who have or have not gotten the vaccine for HPV (Human Papillomavirus).   If you had a hysterectomy for a problem that was not a cancer or a condition that could lead to cancer, then you no longer need Pap tests. However, even if you no longer need a Pap test, a regular exam is a good idea to make sure no other problems are starting.    If you are between ages 57 and 12, and you have had normal Pap tests going back 10 years, you no longer need Pap tests. However, even if you no longer need a Pap test, a regular exam is a good idea to make sure no other problems are starting.    If you have had past treatment for cervical cancer or a condition that could lead to cancer, you need Pap tests and screening for cancer for at least 20 years after your treatment.   If Pap tests have been discontinued, risk factors  (such as a new sexual partner) need to be re-assessed to determine if screening should be resumed.   Some women may need screenings more often if they are at high risk for cervical cancer.   Get mammograms done as per the advice of your caregiver.  SEEK IMMEDIATE MEDICAL CARE IF:  You develop abnormal vaginal bleeding.   You have pain or swelling in your legs, shortness of breath, or chest pain.   You develop dizziness or headaches.   You have lumps or changes in your breasts or armpits.   You have slurred speech.  You develop weakness or numbness of your arms or legs.   You have pain, burning, or bleeding when urinating.   You develop abdominal pain.  Document Released: 09/15/2003 Document Revised: 12/06/2011 Document Reviewed: 01/03/2011 [facility] Patient Information 2012 [place], [place].

## 2012-05-01 NOTE — Progress Notes (Signed)
Martha Alvarado [birth year] [id number]   History:    46 y.o.  for annual exam who has not had a gynecological exam over 5 years. No prior mammograms. Patient does state she does her monthly self breast examination. She's been followed by Dr. Oliver Barre who does all her labs and is treating her for hypertension. Her blood pressure today was 120/84 and her weight was 193 (BMI 32.62). Patient denies any prior abnormal Pap smears her last one being 2005. She does have a past history of syphilis and Chlamydia infection. She's not using any form of contraception but states that her last menstrual period was in 2007. She is having night sweats some irritability and some vaginal dryness and some decreased libido. She stated several years ago that they thought she was in premature menopause and was started on hormone replacement therapy which she discontinued because of side effects.  Past medical history,surgical history, family history and social history were all reviewed and documented in the EPIC chart.  Gynecologic History No LMP recorded. Patient is not currently having periods (Reason: Perimenopausal). Contraception: none Last Pap: 2005. Results were: normal Last mammogram: No prior study. Results were: No prior study  Obstetric History OB History    Grav Para Term Preterm Abortions TAB SAB Ect Mult Living   2 2 2       2      # Outc Date GA Lbr Len/2nd Wgt Sex Del Anes PTL Lv   1 TRM     F SVD  No Yes   2 TRM     M SVD  No Yes       ROS:  Was performed and pertinent positives and negatives are included in the history.  Exam: chaperone present  BP 128/84  Ht 5' 4.5" (1.638 m)  Wt 193 lb (87.544 kg)  BMI 32.62 kg/m2  Body mass index is 32.62 kg/(m^2).  General appearance : Well developed well nourished female. No acute distress HEENT: Neck supple, trachea midline, no carotid bruits, no thyroidmegaly Lungs: Clear to auscultation, no rhonchi or wheezes, or rib retractions  Heart:  Regular rate and rhythm, no murmurs or gallops Breast:Examined in sitting and supine position were symmetrical in appearance, no palpable masses or tenderness,  no skin retraction, no nipple inversion, no nipple discharge, no skin discoloration, no axillary or supraclavicular lymphadenopathy Abdomen: no palpable masses or tenderness, no rebound or guarding Extremities: no edema or skin discoloration or tenderness  Pelvic:  Bartholin, Urethra, Skene Glands: Within normal limits             Vagina: No gross lesions or discharge  Cervix: No gross lesions or discharge  Uterus  anteverted, normal size, shape and consistency, non-tender and mobile  Adnexa  Without masses or tenderness  Anus and perineum  normal   Rectovaginal  normal sphincter tone without palpated masses or tenderness             Hemoccult not done     Assessment/Plan:  46 y.o. female for annual exam with symptoms of night sweats irritability mood swing vaginal dryness and decreased libido. Patient currently on no hormone replacement therapy. Patient denies any visual disturbances, nipple discharge or headaches. We'll check a FSH, TSH, and prolactin and patient will return back next week to discuss results and plan a course of management. Literature information was provided on the menopause and on hormone replacement therapy as well as cholesterol lowering diet and exercise. The other labs will be left for Dr.  John to draw in his office during a fasting state in the next few months. She was given a requisition to schedule her baseline mammogram and encouraged to do her monthly self breast examination. We'll also encouraged calcium and vitamin D for osteoporosis prevention along with regular exercise. Pap smear was done today. Will followup next week.    Ok Edwards MD, 10:32 AM 05/01/2012

## 2012-05-08 ENCOUNTER — Encounter: Payer: Self-pay | Admitting: Gynecology

## 2012-05-08 ENCOUNTER — Ambulatory Visit (INDEPENDENT_AMBULATORY_CARE_PROVIDER_SITE_OTHER): Payer: BC Managed Care – PPO | Admitting: Gynecology

## 2012-05-08 VITALS — BP 126/84

## 2012-05-08 DIAGNOSIS — N951 Menopausal and female climacteric states: Secondary | ICD-10-CM | POA: Insufficient documentation

## 2012-05-08 MED ORDER — MEDROXYPROGESTERONE ACETATE 10 MG PO TABS: ORAL_TABLET | ORAL | Status: DC

## 2012-05-08 MED ORDER — ESTRADIOL 0.52 MG/0.87 GM (0.06%) TD GEL: 1.0000 "application " | Freq: Every day | TRANSDERMAL | Status: DC

## 2012-05-08 NOTE — Patient Instructions (Signed)
Apply the Elestrin (one pump) to one arm only every night and alternate arms. The first ten days of the month take the Provera also. Remember to do your  monthly breast exam and to follow up with your mammogram

## 2012-05-08 NOTE — Progress Notes (Signed)
Patient's a 46 year old who was seen in the office for her annual exam on May 2. She now had a gynecological examination in over 5 years. See encountered no for additional details. She had been complaining of vasomotor symptoms consisting of hot flashes, irritability, mood swing, vaginal dryness and decreased libido. Several years ago because she was in premature menopause another provider had started her on some form of estrogen replacement therapy but she discontinued after a short time because she did not like the effects and became fearful. Patient now is complaining that her quality of life has changed significantly and that she needs help. When she was here in the office recently her [facility] was found to be in the menopausal range (33) and her TSH and prolactin were normal.  We went through a detailed discussion today about hormone replacement therapy and menopause. On prior visit she had been provided with literature information on both subjects. We discussed today also the women's health initiative study as well as the potential risk of breast cancer associated with hormone replacement therapy. Patient instructed to do her monthly self breast examination. She has not had a prior mammogram requisition was provided and she has one scheduled the next few weeks. She denies any family member with history of breast cancer. The risk benefits and pros and cons of hormone replacement therapy were discussed all questions are answered and she fully except and we'll start on the following regimen:  Elestrin 0.06% one pump application to one arm daily Provera 10 mg one tablet by mouth first 10 days of each month.  Assessment/plan: Patient menopause confirmed by laboratory results and clinical presentation. Patient counseled for hormone replacement therapy and will be started on the above-mentioned regimen. She was reminded to followup with her mammogram and to continue to do her monthly self breast examination. She  was reminded to take her calcium and vitamin D for osteoporosis prevention. Next year she will need a bone density study.

## 2012-05-12 ENCOUNTER — Other Ambulatory Visit: Payer: Self-pay | Admitting: Gynecology

## 2012-05-12 DIAGNOSIS — Z1231 Encounter for screening mammogram for malignant neoplasm of breast: Secondary | ICD-10-CM

## 2012-05-23 ENCOUNTER — Ambulatory Visit
Admission: RE | Admit: 2012-05-23 | Discharge: 2012-05-23 | Disposition: A | Discharge status: Home / Self Care | Payer: BC Managed Care – PPO | Source: Ambulatory Visit | Attending: Gynecology | Admitting: Gynecology

## 2012-05-23 DIAGNOSIS — Z1231 Encounter for screening mammogram for malignant neoplasm of breast: Secondary | ICD-10-CM

## 2012-06-03 ENCOUNTER — Other Ambulatory Visit: Payer: Self-pay | Admitting: *Deleted

## 2012-06-03 DIAGNOSIS — R921 Mammographic calcification found on diagnostic imaging of breast: Secondary | ICD-10-CM

## 2012-06-24 ENCOUNTER — Ambulatory Visit
Admission: RE | Admit: 2012-06-24 | Discharge: 2012-06-24 | Disposition: A | Discharge status: Home / Self Care | Payer: BC Managed Care – PPO | Source: Ambulatory Visit | Attending: Gynecology | Admitting: Gynecology

## 2012-06-24 DIAGNOSIS — R921 Mammographic calcification found on diagnostic imaging of breast: Secondary | ICD-10-CM

## 2012-07-18 ENCOUNTER — Other Ambulatory Visit: Payer: Self-pay | Admitting: Internal Medicine

## 2012-08-17 ENCOUNTER — Other Ambulatory Visit: Payer: Self-pay | Admitting: Internal Medicine

## 2012-08-22 ENCOUNTER — Other Ambulatory Visit: Payer: Self-pay | Admitting: Internal Medicine

## 2012-10-22 ENCOUNTER — Other Ambulatory Visit: Payer: Self-pay | Admitting: Internal Medicine

## 2012-11-18 ENCOUNTER — Other Ambulatory Visit: Payer: Self-pay | Admitting: *Deleted

## 2012-11-18 DIAGNOSIS — R921 Mammographic calcification found on diagnostic imaging of breast: Secondary | ICD-10-CM

## 2012-11-25 ENCOUNTER — Other Ambulatory Visit: Payer: Self-pay | Admitting: Internal Medicine

## 2012-12-15 ENCOUNTER — Ambulatory Visit
Admission: RE | Admit: 2012-12-15 | Discharge: 2012-12-15 | Disposition: A | Discharge status: Home / Self Care | Payer: Self-pay | Source: Ambulatory Visit | Attending: Gynecology | Admitting: Gynecology

## 2012-12-15 DIAGNOSIS — R921 Mammographic calcification found on diagnostic imaging of breast: Secondary | ICD-10-CM

## 2012-12-25 ENCOUNTER — Other Ambulatory Visit: Payer: Self-pay | Admitting: Internal Medicine

## 2013-02-25 ENCOUNTER — Other Ambulatory Visit: Payer: Self-pay | Admitting: Internal Medicine

## 2013-03-09 ENCOUNTER — Telehealth: Payer: Self-pay

## 2013-03-09 NOTE — Telephone Encounter (Signed)
Labs entered.

## 2013-03-12 ENCOUNTER — Other Ambulatory Visit: Payer: Self-pay | Admitting: Internal Medicine

## 2013-04-14 ENCOUNTER — Other Ambulatory Visit: Payer: Self-pay | Admitting: Internal Medicine

## 2013-04-14 ENCOUNTER — Ambulatory Visit (INDEPENDENT_AMBULATORY_CARE_PROVIDER_SITE_OTHER): Payer: 59 | Admitting: Internal Medicine

## 2013-04-14 ENCOUNTER — Other Ambulatory Visit (INDEPENDENT_AMBULATORY_CARE_PROVIDER_SITE_OTHER): Payer: 59

## 2013-04-14 ENCOUNTER — Encounter: Payer: Self-pay | Admitting: Internal Medicine

## 2013-04-14 VITALS — BP 110/80 | HR 63 | Temp 98.4°F | Ht 66.0 in | Wt 205.1 lb

## 2013-04-14 DIAGNOSIS — Z0000 Encounter for general adult medical examination without abnormal findings: Secondary | ICD-10-CM

## 2013-04-14 LAB — URINALYSIS, ROUTINE W REFLEX MICROSCOPIC
Bilirubin Urine: NEGATIVE
Hgb urine dipstick: NEGATIVE
Nitrite: NEGATIVE
Total Protein, Urine: NEGATIVE
Urine Glucose: NEGATIVE
pH: 7 (ref 5.0–8.0)

## 2013-04-14 LAB — CBC WITH DIFFERENTIAL/PLATELET
Basophils Relative: 0.4 % (ref 0.0–3.0)
Eosinophils Absolute: 0.1 10*3/uL (ref 0.0–0.7)
Eosinophils Relative: 1.7 % (ref 0.0–5.0)
HCT: 38.2 % (ref 36.0–46.0)
Hemoglobin: 13 g/dL (ref 12.0–15.0)
Lymphs Abs: 2.1 10*3/uL (ref 0.7–4.0)
MCHC: 33.9 g/dL (ref 30.0–36.0)
MCV: 83.7 fl (ref 78.0–100.0)
Monocytes Absolute: 0.4 10*3/uL (ref 0.1–1.0)
Neutro Abs: 1.9 10*3/uL (ref 1.4–7.7)
RBC: 4.56 Mil/uL (ref 3.87–5.11)
WBC: 4.6 10*3/uL (ref 4.5–10.5)

## 2013-04-14 LAB — LIPID PANEL
Cholesterol: 194 mg/dL (ref 0–200)
HDL: 45 mg/dL (ref >39.00)
Triglycerides: 72 mg/dL (ref 0.0–149.0)
VLDL: 14.4 mg/dL (ref 0.0–40.0)

## 2013-04-14 LAB — BASIC METABOLIC PANEL
BUN: 20 mg/dL (ref 6–23)
CO2: 26 mEq/L (ref 19–32)
Calcium: 9 mg/dL (ref 8.4–10.5)
Creatinine, Ser: 1.1 mg/dL (ref 0.4–1.2)
GFR: 65.9 mL/min (ref >60.00)
Glucose, Bld: 90 mg/dL (ref 70–99)

## 2013-04-14 LAB — HEPATIC FUNCTION PANEL
ALT: 28 U/L (ref 0–35)
Bilirubin, Direct: 0.1 mg/dL (ref 0.0–0.3)
Total Bilirubin: 0.7 mg/dL (ref 0.3–1.2)

## 2013-04-14 MED ORDER — POTASSIUM CHLORIDE ER 10 MEQ PO TBCR: 10.0000 meq | EXTENDED_RELEASE_TABLET | Freq: Every day | ORAL | Status: DC

## 2013-04-14 MED ORDER — OLMESARTAN-AMLODIPINE-HCTZ 40-5-25 MG PO TABS: ORAL_TABLET | ORAL | Status: DC

## 2013-04-14 MED ORDER — AMLODIPINE-OLMESARTAN 10-40 MG PO TABS: 1.0000 | ORAL_TABLET | Freq: Every day | ORAL | Status: DC

## 2013-04-14 NOTE — Progress Notes (Signed)
Subjective:    Patient ID: Martha Alvarado, female    DOB: May 01, 1966, 47 y.o.   MRN: [id number]  HPI  Here for wellness and f/u;  Overall doing ok;  Pt denies CP, worsening SOB, DOE, wheezing, orthopnea, PND, worsening LE edema, palpitations, dizziness or syncope.  Pt denies neurological change such as new headache, facial or extremity weakness.  Pt denies polydipsia, polyuria, or low sugar symptoms. Pt states overall good compliance with treatment and medications, good tolerability, and has been trying to follow lower cholesterol diet.  Pt denies worsening depressive symptoms, suicidal ideation or panic. No fever, night sweats, wt loss, loss of appetite, or other constitutional symptoms.  Pt states good ability with ADL's, has low fall risk, home safety reviewed and adequate, no other significant changes in hearing or vision, and only occasionally active with exercise.  No acute complaints Past Medical History  Diagnosis Date  . CHLAMYDIAL INFECTION, HX OF 09/13/2007  . GLUCOSE INTOLERANCE 04/26/2009  . HYPERTENSION 09/13/2007  . OBESITY NOS 09/13/2007  . SINUSITIS- ACUTE-NOS 06/07/2010  . SYPHILIS 09/13/2007  . Impaired glucose tolerance 07/08/2011   Past Surgical History  Procedure Laterality Date  . Tubal ligation  1991    reports that she has never smoked. She has never used smokeless tobacco. She reports that she does not drink alcohol or use illicit drugs. family history includes Diabetes in her mother, other, and sister and Hypertension in her other and sisters. No Known Allergies Current Outpatient Prescriptions on File Prior to Visit  Medication Sig Dispense Refill  . aspirin 81 MG tablet Take 81 mg by mouth daily.        . Estradiol 0.52 MG/0.87 GM (0.06%) GEL Apply 1 application topically daily. Apply to one arm daily only  1 Bottle  11  . medroxyPROGESTERone (PROVERA) 10 MG tablet Take one tablet the first ten days of the month  30 tablet  4   No current facility-administered  medications on file prior to visit.   Review of Systems Constitutional: Negative for diaphoresis, activity change, appetite change or unexpected weight change.  HENT: Negative for hearing loss, ear pain, facial swelling, mouth sores and neck stiffness.   Eyes: Negative for pain, redness and visual disturbance.  Respiratory: Negative for shortness of breath and wheezing.   Cardiovascular: Negative for chest pain and palpitations.  Gastrointestinal: Negative for diarrhea, blood in stool, abdominal distention or other pain Genitourinary: Negative for hematuria, flank pain or change in urine volume.  Musculoskeletal: Negative for myalgias and joint swelling.  Skin: Negative for color change and wound.  Neurological: Negative for syncope and numbness. other than noted Hematological: Negative for adenopathy.  Psychiatric/Behavioral: Negative for hallucinations, self-injury, decreased concentration and agitation.      Objective:   Physical Exam BP 110/80  Pulse 63  Temp(Src) 98.4 F (36.9 C) (Oral)  Ht 5\' 6"  (1.676 m)  Wt 205 lb 2 oz (93.044 kg)  BMI 33.12 kg/m2  SpO2 98% VS noted,  Constitutional: Pt is oriented to person, place, and time. Appears well-developed and well-nourished.  Head: Normocephalic and atraumatic.  Right Ear: External ear normal.  Left Ear: External ear normal.  Nose: Nose normal.  Mouth/Throat: Oropharynx is clear and moist.  Eyes: Conjunctivae and EOM are normal. Pupils are equal, round, and reactive to light.  Neck: Normal range of motion. Neck supple. No JVD present. No tracheal deviation present.  Cardiovascular: Normal rate, regular rhythm, normal heart sounds and intact distal pulses.   Pulmonary/Chest: Effort  normal and breath sounds normal.  Abdominal: Soft. Bowel sounds are normal. There is no tenderness. No HSM  Musculoskeletal: Normal range of motion. Exhibits no edema.  Lymphadenopathy:  Has no cervical adenopathy.  Neurological: Pt is alert and  oriented to person, place, and time. Pt has normal reflexes. No cranial nerve deficit.  Skin: Skin is warm and dry. No rash noted.  Psychiatric:  Has  normal mood and affect. Behavior is normal.     Assessment & Plan:

## 2013-04-14 NOTE — Patient Instructions (Addendum)
Please continue all other medications as before, and refills have been done if requested to [facility] Please continue your efforts at being more active, low cholesterol diet, and weight control. You are otherwise up to date with prevention measures today. Please go to the LAB in the Basement (turn left off the elevator) for the tests to be done today You will be contacted by phone if any changes need to be made immediately.  Otherwise, you will receive a letter about your results with an explanation, but please check with MyChart first. Thank you for enrolling in MyChart. Please follow the instructions below to securely access your online medical record. MyChart allows you to send messages to your doctor, view your test results, renew your prescriptions, schedule appointments, and more. To Log into My Chart online, please go by [employer] or [employer] to [email or web address], or download the MyChart App from the [employer] of [employer] .  Your Username is: [email or web address] (pass blue) Please send a practice Message on Mychart later today. Please return in 1 year for your yearly visit, or sooner if needed, with Lab testing done 3-5 days before

## 2013-04-14 NOTE — Assessment & Plan Note (Signed)

## 2013-05-01 ENCOUNTER — Other Ambulatory Visit: Payer: Self-pay

## 2013-05-01 DIAGNOSIS — Z1231 Encounter for screening mammogram for malignant neoplasm of breast: Secondary | ICD-10-CM

## 2013-05-12 ENCOUNTER — Encounter: Payer: Self-pay | Admitting: Internal Medicine

## 2013-05-12 MED ORDER — OLMESARTAN-AMLODIPINE-HCTZ 40-5-12.5 MG PO TABS: 1.0000 | ORAL_TABLET | Freq: Every day | ORAL | Status: DC

## 2013-05-12 NOTE — Telephone Encounter (Signed)
The swelling is prob caused by the amlodipine at the 10 mg  We would need to change the amlodipine part to 5 mg, keep the benicar part at 40 mg, and add HCTZ 12.5 mg (a small dose fluid pill ) - this is called tribenzor and has been sent to the pharmacy

## 2013-05-18 ENCOUNTER — Ambulatory Visit (INDEPENDENT_AMBULATORY_CARE_PROVIDER_SITE_OTHER): Payer: 59 | Admitting: Gynecology

## 2013-05-18 ENCOUNTER — Encounter: Payer: Self-pay | Admitting: Gynecology

## 2013-05-18 VITALS — BP 124/78 | Ht 64.5 in | Wt 206.0 lb

## 2013-05-18 DIAGNOSIS — Z01419 Encounter for gynecological examination (general) (routine) without abnormal findings: Secondary | ICD-10-CM

## 2013-05-18 DIAGNOSIS — Z79890 Hormone replacement therapy (postmenopausal): Secondary | ICD-10-CM

## 2013-05-18 DIAGNOSIS — N951 Menopausal and female climacteric states: Secondary | ICD-10-CM

## 2013-05-18 MED ORDER — MEDROXYPROGESTERONE ACETATE 10 MG PO TABS: ORAL_TABLET | ORAL | Status: DC

## 2013-05-18 MED ORDER — ESTRADIOL 0.52 MG/0.87 GM (0.06%) TD GEL: 1.0000 "application " | Freq: Every day | TRANSDERMAL | Status: DC

## 2013-05-18 NOTE — Progress Notes (Signed)
Martha Alvarado 10-Oct-1966 [id number]   History:    47 y.o.  for annual gyn exam with no complaints today. In 2013 patient was started on Olestra and 0.06% transdermal E. Apply to one arm before bedtime with the addition of Provera 10 mg for 10 days of the month for vasomotor symptoms. She states she has done well and denies any vaginal bleeding. Back in 2008 she had a history of small fibroids. Also back in 2008 patient had history of positive Chlamydia and also has been treated in the past for syphilis as well. Dr. Jonny Ruiz has been doing her blood work and has been following her for hypertension. Patient scheduled for mammogram next June. Patient denies any abnormal Pap smear. Patient with prior tubal sterilization procedure.  Past medical history,surgical history, family history and social history were all reviewed and documented in the EPIC chart.  Gynecologic History No LMP recorded. Patient is not currently having periods (Reason: Perimenopausal). Contraception: post menopausal status Last Pap: 2013. Results were: normal Last mammogram: 2013. Results were: normal  Obstetric History OB History   Grav Para Term Preterm Abortions TAB SAB Ect Mult Living   2 2 2       2      # Outc Date GA Lbr Len/2nd Wgt Sex Del Anes PTL Lv   1 TRM     F SVD  No Yes   2 TRM     M SVD  No Yes       ROS: A ROS was performed and pertinent positives and negatives are included in the history.  GENERAL: No fevers or chills. HEENT: No change in vision, no earache, sore throat or sinus congestion. NECK: No pain or stiffness. CARDIOVASCULAR: No chest pain or pressure. No palpitations. PULMONARY: No shortness of breath, cough or wheeze. GASTROINTESTINAL: No abdominal pain, nausea, vomiting or diarrhea, melena or bright red blood per rectum. GENITOURINARY: No urinary frequency, urgency, hesitancy or dysuria. MUSCULOSKELETAL: No joint or muscle pain, no back pain, no recent trauma. DERMATOLOGIC: No rash, no itching, no  lesions. ENDOCRINE: No polyuria, polydipsia, no heat or cold intolerance. No recent change in weight. HEMATOLOGICAL: No anemia or easy bruising or bleeding. NEUROLOGIC: No headache, seizures, numbness, tingling or weakness. PSYCHIATRIC: No depression, no loss of interest in normal activity or change in sleep pattern.     Exam: chaperone present  BP 124/78  Ht 5' 4.5" (1.638 m)  Wt 206 lb (93.441 kg)  BMI 34.83 kg/m2  Body mass index is 34.83 kg/(m^2).  General appearance : Well developed well nourished female. No acute distress HEENT: Neck supple, trachea midline, no carotid bruits, no thyroidmegaly Lungs: Clear to auscultation, no rhonchi or wheezes, or rib retractions  Heart: Regular rate and rhythm, no murmurs or gallops Breast:Examined in sitting and supine position were symmetrical in appearance, no palpable masses or tenderness,  no skin retraction, no nipple inversion, no nipple discharge, no skin discoloration, no axillary or supraclavicular lymphadenopathy Abdomen: no palpable masses or tenderness, no rebound or guarding Extremities: no edema or skin discoloration or tenderness  Pelvic:  Bartholin, Urethra, Skene Glands: Within normal limits             Vagina: No gross lesions or discharge  Cervix: No gross lesions or discharge  Uterus  anteverted, normal size, shape and consistency, non-tender and mobile  Adnexa  Without masses or tenderness  Anus and perineum  normal   Rectovaginal  normal sphincter tone without palpated masses or tenderness  Hemoccult not indicated     Assessment/Plan:  47 y.o. female for annual exam doing well on hormone replacement therapy consisting of the elestrin 0.06% transdermally to one arm daily with the addition of Provera 10 mg for 10 days of each month. Prescription refill was provided. She was reminded take her calcium and vitamin D for osteoporosis prevention. No Pap smear done today new guidelines discussed. She was reminded to  do her monthly breast exams and to followup with her schedule mammogram next month. Her labs were drawn by her primary physician recently.    Ok Edwards MD, 2:02 PM 05/18/2013

## 2013-05-31 ENCOUNTER — Other Ambulatory Visit: Payer: Self-pay | Admitting: Gynecology

## 2013-06-03 ENCOUNTER — Ambulatory Visit
Admission: RE | Admit: 2013-06-03 | Discharge: 2013-06-03 | Disposition: A | Discharge status: Home / Self Care | Payer: 59 | Source: Ambulatory Visit

## 2013-06-03 DIAGNOSIS — Z1231 Encounter for screening mammogram for malignant neoplasm of breast: Secondary | ICD-10-CM

## 2013-06-03 LAB — HM MAMMOGRAPHY

## 2013-11-05 ENCOUNTER — Other Ambulatory Visit: Payer: Self-pay

## 2014-01-24 ENCOUNTER — Other Ambulatory Visit: Payer: Self-pay | Admitting: Gynecology

## 2014-03-26 ENCOUNTER — Telehealth: Payer: Self-pay

## 2014-03-26 MED ORDER — OLMESARTAN-AMLODIPINE-HCTZ 40-5-12.5 MG PO TABS: 1.0000 | ORAL_TABLET | Freq: Every day | ORAL | Status: DC

## 2014-03-26 NOTE — Telephone Encounter (Signed)
The patient is hoping to get a three month supply of her blood pressure medicine sent to her pharmacy.

## 2014-05-18 ENCOUNTER — Ambulatory Visit (INDEPENDENT_AMBULATORY_CARE_PROVIDER_SITE_OTHER): Payer: 59 | Admitting: Internal Medicine

## 2014-05-18 ENCOUNTER — Encounter: Payer: Self-pay | Admitting: Internal Medicine

## 2014-05-18 ENCOUNTER — Telehealth: Payer: Self-pay | Admitting: Internal Medicine

## 2014-05-18 ENCOUNTER — Other Ambulatory Visit (INDEPENDENT_AMBULATORY_CARE_PROVIDER_SITE_OTHER): Payer: 59

## 2014-05-18 VITALS — BP 120/84 | HR 64 | Temp 98.1°F | Ht 66.0 in | Wt 211.1 lb

## 2014-05-18 DIAGNOSIS — Z0000 Encounter for general adult medical examination without abnormal findings: Secondary | ICD-10-CM

## 2014-05-18 DIAGNOSIS — I10 Essential (primary) hypertension: Secondary | ICD-10-CM

## 2014-05-18 LAB — BASIC METABOLIC PANEL
BUN: 18 mg/dL (ref 6–23)
CALCIUM: 9.8 mg/dL (ref 8.4–10.5)
CO2: 31 meq/L (ref 19–32)
CREATININE: 1.2 mg/dL (ref 0.4–1.2)
Chloride: 102 mEq/L (ref 96–112)
GFR: 63.03 mL/min (ref >60.00)
Glucose, Bld: 91 mg/dL (ref 70–99)
Potassium: 3.6 mEq/L (ref 3.5–5.1)
Sodium: 139 mEq/L (ref 135–145)

## 2014-05-18 LAB — CBC WITH DIFFERENTIAL/PLATELET
BASOS PCT: 0.4 % (ref 0.0–3.0)
Basophils Absolute: 0 10*3/uL (ref 0.0–0.1)
EOS PCT: 1.4 % (ref 0.0–5.0)
Eosinophils Absolute: 0.1 10*3/uL (ref 0.0–0.7)
HCT: 39.5 % (ref 36.0–46.0)
HEMOGLOBIN: 13.6 g/dL (ref 12.0–15.0)
LYMPHS PCT: 46.6 % — AB (ref 12.0–46.0)
Lymphs Abs: 2.1 10*3/uL (ref 0.7–4.0)
MCHC: 34.4 g/dL (ref 30.0–36.0)
MCV: 82.8 fl (ref 78.0–100.0)
MONOS PCT: 9.1 % (ref 3.0–12.0)
Monocytes Absolute: 0.4 10*3/uL (ref 0.1–1.0)
NEUTROS ABS: 1.9 10*3/uL (ref 1.4–7.7)
NEUTROS PCT: 42.5 % — AB (ref 43.0–77.0)
Platelets: 265 10*3/uL (ref 150.0–400.0)
RBC: 4.78 Mil/uL (ref 3.87–5.11)
RDW: 13.4 % (ref 11.5–15.5)
WBC: 4.5 10*3/uL (ref 4.0–10.5)

## 2014-05-18 LAB — LIPID PANEL
CHOL/HDL RATIO: 4
CHOLESTEROL: 210 mg/dL — AB (ref 0–200)
HDL: 56.1 mg/dL (ref >39.00)
LDL Cholesterol: 140 mg/dL — ABNORMAL HIGH (ref 0–99)
TRIGLYCERIDES: 70 mg/dL (ref 0.0–149.0)
VLDL: 14 mg/dL (ref 0.0–40.0)

## 2014-05-18 LAB — TSH: TSH: 1.67 u[IU]/mL (ref 0.35–4.50)

## 2014-05-18 LAB — HEPATIC FUNCTION PANEL
ALBUMIN: 4.1 g/dL (ref 3.5–5.2)
ALT: 44 U/L — ABNORMAL HIGH (ref 0–35)
AST: 26 U/L (ref 0–37)
Alkaline Phosphatase: 95 U/L (ref 39–117)
BILIRUBIN DIRECT: 0.1 mg/dL (ref 0.0–0.3)
TOTAL PROTEIN: 7.5 g/dL (ref 6.0–8.3)
Total Bilirubin: 0.5 mg/dL (ref 0.2–1.2)

## 2014-05-18 LAB — URINALYSIS, ROUTINE W REFLEX MICROSCOPIC
Bilirubin Urine: NEGATIVE
Hgb urine dipstick: NEGATIVE
Ketones, ur: NEGATIVE
Leukocytes, UA: NEGATIVE
NITRITE: NEGATIVE
RBC / HPF: NONE SEEN (ref 0–?)
SPECIFIC GRAVITY, URINE: 1.025 (ref 1.000–1.030)
TOTAL PROTEIN, URINE-UPE24: NEGATIVE
URINE GLUCOSE: NEGATIVE
Urobilinogen, UA: 0.2 (ref 0.0–1.0)
pH: 6 (ref 5.0–8.0)

## 2014-05-18 NOTE — Assessment & Plan Note (Signed)
stable overall by history and exam, recent data reviewed with pt, and pt to continue medical treatment as before,  to f/u any worsening symptoms or concerns, for labs to r/o low k recurrent

## 2014-05-18 NOTE — Progress Notes (Signed)
Pre visit review using our clinic review tool, if applicable. No additional management support is needed unless otherwise documented below in the visit note. 

## 2014-05-18 NOTE — Telephone Encounter (Signed)
Relevant patient education assigned to patient using Emmi. ° °

## 2014-05-18 NOTE — Assessment & Plan Note (Signed)

## 2014-05-18 NOTE — Patient Instructions (Signed)
Please continue all other medications as before, and refills have been done if requested. Please have the pharmacy call with any other refills you may need.  Please continue your efforts at being more active, low cholesterol diet, and weight control.  You are otherwise up to date with prevention measures today.  Please go to the LAB in the Basement (turn left off the elevator) for the tests to be done today  You will be contacted by phone if any changes need to be made immediately.  Otherwise, you will receive a letter about your results with an explanation, but please check with MyChart first.  Please remember to sign up for MyChart if you have not done so, as this will be important to you in the future with finding out test results, communicating by private email, and scheduling acute appointments online when needed.  Please return in 1 year for your yearly visit, or sooner if needed, with Lab testing done 3-5 days before

## 2014-05-18 NOTE — Progress Notes (Signed)
Subjective:    Patient ID: Martha Alvarado, female    DOB: 1966/03/10, 48 y.o.   MRN: [id number]  HPI  Here for wellness and f/u;  Overall doing ok;  Pt denies CP, worsening SOB, DOE, wheezing, orthopnea, PND, worsening LE edema, palpitations, dizziness or syncope.  Pt denies neurological change such as new headache, facial or extremity weakness.  Pt denies polydipsia, polyuria, or low sugar symptoms. Pt states overall good compliance with treatment and medications, good tolerability, and has been trying to follow lower cholesterol diet.  Pt denies worsening depressive symptoms, suicidal ideation or panic. No fever, night sweats, wt loss, loss of appetite, or other constitutional symptoms.  Pt states good ability with ADL's, has low fall risk, home safety reviewed and adequate, no other significant changes in hearing or vision, and only occasionally active with exercise.  No current complaints Past Medical History  Diagnosis Date  . CHLAMYDIAL INFECTION, HX OF 09/13/2007  . GLUCOSE INTOLERANCE 04/26/2009  . HYPERTENSION 09/13/2007  . OBESITY NOS 09/13/2007  . SINUSITIS- ACUTE-NOS 06/07/2010  . SYPHILIS 09/13/2007  . Impaired glucose tolerance 07/08/2011   Past Surgical History  Procedure Laterality Date  . Tubal ligation  1991    reports that she has never smoked. She has never used smokeless tobacco. She reports that she does not drink alcohol or use illicit drugs. family history includes Diabetes in her mother, other, and sister; Hypertension in her other, sister, and sister. No Known Allergies Current Outpatient Prescriptions on File Prior to Visit  Medication Sig Dispense Refill  . aspirin 81 MG tablet Take 81 mg by mouth daily.        . Olmesartan-Amlodipine-HCTZ (TRIBENZOR) 40-5-12.5 MG TABS Take 1 tablet by mouth daily.  90 tablet  3   No current facility-administered medications on file prior to visit.   Review of Systems Constitutional: Negative for increased diaphoresis, other  activity, appetite or other siginficant weight change  HENT: Negative for worsening hearing loss, ear pain, facial swelling, mouth sores and neck stiffness.   Eyes: Negative for other worsening pain, redness or visual disturbance.  Respiratory: Negative for shortness of breath and wheezing.   Cardiovascular: Negative for chest pain and palpitations.  Gastrointestinal: Negative for diarrhea, blood in stool, abdominal distention or other pain Genitourinary: Negative for hematuria, flank pain or change in urine volume.  Musculoskeletal: Negative for myalgias or other joint complaints.  Skin: Negative for color change and wound.  Neurological: Negative for syncope and numbness. other than noted Hematological: Negative for adenopathy. or other swelling Psychiatric/Behavioral: Negative for hallucinations, self-injury, decreased concentration or other worsening agitation.      Objective:   Physical Exam BP 120/84  Pulse 64  Temp(Src) 98.1 F (36.7 C) (Oral)  Ht 5\' 6"  (1.676 m)  Wt 211 lb 2 oz (95.766 kg)  BMI 34.09 kg/m2  SpO2 98% VS noted,  Constitutional: Pt is oriented to person, place, and time. Appears well-developed and well-nourished.  Head: Normocephalic and atraumatic.  Right Ear: External ear normal.  Left Ear: External ear normal.  Nose: Nose normal.  Mouth/Throat: Oropharynx is clear and moist.  Eyes: Conjunctivae and EOM are normal. Pupils are equal, round, and reactive to light.  Neck: Normal range of motion. Neck supple. No JVD present. No tracheal deviation present.  Cardiovascular: Normal rate, regular rhythm, normal heart sounds and intact distal pulses.   Pulmonary/Chest: Effort normal and breath sounds without rales or wheezing  Abdominal: Soft. Bowel sounds are normal. NT. No HSM  Musculoskeletal: Normal range of motion. Exhibits no edema.  Lymphadenopathy:  Has no cervical adenopathy.  Neurological: Pt is alert and oriented to person, place, and time. Pt has  normal reflexes. No cranial nerve deficit. Motor grossly intact Skin: Skin is warm and dry. No rash noted. No LE edema Psychiatric:  Has normal mood and affect. Behavior is normal.     Assessment & Plan:

## 2014-05-31 ENCOUNTER — Other Ambulatory Visit: Payer: Self-pay | Admitting: *Deleted

## 2014-05-31 MED ORDER — OLMESARTAN-AMLODIPINE-HCTZ 40-5-12.5 MG PO TABS: 1.0000 | ORAL_TABLET | Freq: Every day | ORAL | Status: DC

## 2014-11-01 ENCOUNTER — Encounter: Payer: Self-pay | Admitting: Internal Medicine

## 2015-05-20 ENCOUNTER — Encounter: Payer: Self-pay | Admitting: Internal Medicine

## 2015-05-20 ENCOUNTER — Other Ambulatory Visit (INDEPENDENT_AMBULATORY_CARE_PROVIDER_SITE_OTHER): Payer: 59

## 2015-05-20 ENCOUNTER — Ambulatory Visit (INDEPENDENT_AMBULATORY_CARE_PROVIDER_SITE_OTHER): Payer: 59 | Admitting: Internal Medicine

## 2015-05-20 VITALS — BP 126/88 | HR 65 | Temp 97.5°F | Wt 218.0 lb

## 2015-05-20 DIAGNOSIS — Z0000 Encounter for general adult medical examination without abnormal findings: Secondary | ICD-10-CM

## 2015-05-20 DIAGNOSIS — E785 Hyperlipidemia, unspecified: Secondary | ICD-10-CM | POA: Insufficient documentation

## 2015-05-20 DIAGNOSIS — R7302 Impaired glucose tolerance (oral): Secondary | ICD-10-CM

## 2015-05-20 HISTORY — DX: Hyperlipidemia, unspecified: E78.5

## 2015-05-20 LAB — LIPID PANEL
CHOL/HDL RATIO: 4
CHOLESTEROL: 232 mg/dL — AB (ref 0–200)
HDL: 61.4 mg/dL (ref >39.00)
LDL Cholesterol: 154 mg/dL — ABNORMAL HIGH (ref 0–99)
NONHDL: 170.6
TRIGLYCERIDES: 81 mg/dL (ref 0.0–149.0)
VLDL: 16.2 mg/dL (ref 0.0–40.0)

## 2015-05-20 LAB — URINALYSIS, ROUTINE W REFLEX MICROSCOPIC
Bilirubin Urine: NEGATIVE
Hgb urine dipstick: NEGATIVE
KETONES UR: NEGATIVE
LEUKOCYTES UA: NEGATIVE
Nitrite: NEGATIVE
PH: 6 (ref 5.0–8.0)
RBC / HPF: NONE SEEN (ref 0–?)
TOTAL PROTEIN, URINE-UPE24: NEGATIVE
Urine Glucose: NEGATIVE
Urobilinogen, UA: 0.2 (ref 0.0–1.0)
WBC, UA: NONE SEEN (ref 0–?)

## 2015-05-20 LAB — CBC WITH DIFFERENTIAL/PLATELET
Basophils Absolute: 0 10*3/uL (ref 0.0–0.1)
Basophils Relative: 0.5 % (ref 0.0–3.0)
EOS ABS: 0 10*3/uL (ref 0.0–0.7)
Eosinophils Relative: 0.9 % (ref 0.0–5.0)
HCT: 42.7 % (ref 36.0–46.0)
HEMOGLOBIN: 14.5 g/dL (ref 12.0–15.0)
LYMPHS ABS: 2.4 10*3/uL (ref 0.7–4.0)
LYMPHS PCT: 46.5 % — AB (ref 12.0–46.0)
MCHC: 34 g/dL (ref 30.0–36.0)
MCV: 82.5 fl (ref 78.0–100.0)
Monocytes Absolute: 0.4 10*3/uL (ref 0.1–1.0)
Monocytes Relative: 7.5 % (ref 3.0–12.0)
NEUTROS ABS: 2.3 10*3/uL (ref 1.4–7.7)
Neutrophils Relative %: 44.6 % (ref 43.0–77.0)
Platelets: 299 10*3/uL (ref 150.0–400.0)
RBC: 5.18 Mil/uL — ABNORMAL HIGH (ref 3.87–5.11)
RDW: 13 % (ref 11.5–15.5)
WBC: 5.2 10*3/uL (ref 4.0–10.5)

## 2015-05-20 LAB — BASIC METABOLIC PANEL
BUN: 15 mg/dL (ref 6–23)
CO2: 30 meq/L (ref 19–32)
CREATININE: 1.01 mg/dL (ref 0.40–1.20)
Calcium: 10.2 mg/dL (ref 8.4–10.5)
Chloride: 100 mEq/L (ref 96–112)
GFR: 75.1 mL/min (ref >60.00)
GLUCOSE: 104 mg/dL — AB (ref 70–99)
POTASSIUM: 3.3 meq/L — AB (ref 3.5–5.1)
SODIUM: 140 meq/L (ref 135–145)

## 2015-05-20 LAB — HEPATIC FUNCTION PANEL
ALT: 32 U/L (ref 0–35)
AST: 19 U/L (ref 0–37)
Albumin: 4.6 g/dL (ref 3.5–5.2)
Alkaline Phosphatase: 118 U/L — ABNORMAL HIGH (ref 39–117)
BILIRUBIN DIRECT: 0.1 mg/dL (ref 0.0–0.3)
BILIRUBIN TOTAL: 0.4 mg/dL (ref 0.2–1.2)
TOTAL PROTEIN: 7.9 g/dL (ref 6.0–8.3)

## 2015-05-20 LAB — TSH: TSH: 2.52 u[IU]/mL (ref 0.35–4.50)

## 2015-05-20 MED ORDER — OLMESARTAN-AMLODIPINE-HCTZ 40-5-12.5 MG PO TABS
1.0000 | ORAL_TABLET | Freq: Every day | ORAL | Status: DC
Start: 2015-05-20 — End: 2016-05-22

## 2015-05-20 NOTE — Patient Instructions (Signed)

## 2015-05-20 NOTE — Progress Notes (Signed)
Pre visit review using our clinic review tool, if applicable. No additional management support is needed unless otherwise documented below in the visit note. 

## 2015-05-20 NOTE — Progress Notes (Signed)
Subjective:    Patient ID: Martha Alvarado, female    DOB: December 14, 1966, 49 y.o.   MRN: [id number]  HPI  Here for wellness and f/u;  Overall doing ok;  Pt denies Chest pain, worsening SOB, DOE, wheezing, orthopnea, PND, worsening LE edema, palpitations, dizziness or syncope.  Pt denies neurological change such as new headache, facial or extremity weakness.  Pt denies polydipsia, polyuria, or low sugar symptoms. Pt states overall good compliance with treatment and medications, good tolerability, and has been trying to follow appropriate diet.  Pt denies worsening depressive symptoms, suicidal ideation or panic. No fever, night sweats, wt loss, loss of appetite, or other constitutional symptoms.  Pt states good ability with ADL's, has low fall risk, home safety reviewed and adequate, no other significant changes in hearing or vision, and only occasionally active with exercise. Pt prefers to csall on her on for f/u mammogram.  No current complaints Past Medical History  Diagnosis Date  . CHLAMYDIAL INFECTION, HX OF 09/13/2007  . GLUCOSE INTOLERANCE 04/26/2009  . HYPERTENSION 09/13/2007  . OBESITY NOS 09/13/2007  . SINUSITIS- ACUTE-NOS 06/07/2010  . SYPHILIS 09/13/2007  . Impaired glucose tolerance 07/08/2011   Past Surgical History  Procedure Laterality Date  . Tubal ligation  1991    reports that she has never smoked. She has never used smokeless tobacco. She reports that she does not drink alcohol or use illicit drugs. family history includes Diabetes in her mother, other, and sister; Hypertension in her other, sister, and sister. No Known Allergies Current Outpatient Prescriptions on File Prior to Visit  Medication Sig Dispense Refill  . aspirin 81 MG tablet Take 81 mg by mouth daily.       No current facility-administered medications on file prior to visit.    Review of Systems Constitutional: Negative for increased diaphoresis, other activity, appetite or siginficant weight change other than  noted HENT: Negative for worsening hearing loss, ear pain, facial swelling, mouth sores and neck stiffness.   Eyes: Negative for other worsening pain, redness or visual disturbance.  Respiratory: Negative for shortness of breath and wheezing  Cardiovascular: Negative for chest pain and palpitations.  Gastrointestinal: Negative for diarrhea, blood in stool, abdominal distention or other pain Genitourinary: Negative for hematuria, flank pain or change in urine volume.  Musculoskeletal: Negative for myalgias or other joint complaints.  Skin: Negative for color change and wound or drainage.  Neurological: Negative for syncope and numbness. other than noted Hematological: Negative for adenopathy. or other swelling Psychiatric/Behavioral: Negative for hallucinations, SI, self-injury, decreased concentration or other worsening agitation.      Objective:   Physical Exam BP 126/88 mmHg  Pulse 65  Temp(Src) 97.5 F (36.4 C) (Oral)  Wt 218 lb (98.884 kg)  SpO2 99% VS noted,  Constitutional: Pt is oriented to person, place, and time. Appears well-developed and well-nourished, in no significant distress Head: Normocephalic and atraumatic.  Right Ear: External ear normal.  Left Ear: External ear normal.  Nose: Nose normal.  Mouth/Throat: Oropharynx is clear and moist.  Eyes: Conjunctivae and EOM are normal. Pupils are equal, round, and reactive to light.  Neck: Normal range of motion. Neck supple. No JVD present. No tracheal deviation present or significant neck LA or mass Cardiovascular: Normal rate, regular rhythm, normal heart sounds and intact distal pulses.   Pulmonary/Chest: Effort normal and breath sounds without rales or wheezing  Abdominal: Soft. Bowel sounds are normal. NT. No HSM  Musculoskeletal: Normal range of motion. Exhibits no edema.  Lymphadenopathy:  Has no cervical adenopathy.  Neurological: Pt is alert and oriented to person, place, and time. Pt has normal reflexes. No  cranial nerve deficit. Motor grossly intact Skin: Skin is warm and dry. No rash noted.  Psychiatric:  Has normal mood and affect. Behavior is normal.     Assessment & Plan:

## 2015-05-20 NOTE — Assessment & Plan Note (Addendum)

## 2015-05-20 NOTE — Addendum Note (Signed)
Addended by: Biagio Borg on: 05/20/2015 12:54 PM   Modules accepted: Miquel Dunn

## 2016-01-16 ENCOUNTER — Other Ambulatory Visit: Payer: Self-pay

## 2016-01-16 DIAGNOSIS — Z1231 Encounter for screening mammogram for malignant neoplasm of breast: Secondary | ICD-10-CM

## 2016-01-19 ENCOUNTER — Ambulatory Visit
Admission: RE | Admit: 2016-01-19 | Discharge: 2016-01-19 | Disposition: A | Discharge status: Home / Self Care | Payer: 59 | Source: Ambulatory Visit

## 2016-01-19 DIAGNOSIS — Z1231 Encounter for screening mammogram for malignant neoplasm of breast: Secondary | ICD-10-CM

## 2016-05-22 ENCOUNTER — Telehealth: Payer: Self-pay

## 2016-05-22 ENCOUNTER — Other Ambulatory Visit (INDEPENDENT_AMBULATORY_CARE_PROVIDER_SITE_OTHER): Payer: 59

## 2016-05-22 ENCOUNTER — Encounter: Payer: Self-pay | Admitting: Internal Medicine

## 2016-05-22 ENCOUNTER — Telehealth: Payer: Self-pay | Admitting: Internal Medicine

## 2016-05-22 ENCOUNTER — Ambulatory Visit (INDEPENDENT_AMBULATORY_CARE_PROVIDER_SITE_OTHER): Payer: 59 | Admitting: Internal Medicine

## 2016-05-22 VITALS — BP 124/80 | HR 66 | Temp 98.7°F | Resp 20 | Wt 219.0 lb

## 2016-05-22 DIAGNOSIS — E785 Hyperlipidemia, unspecified: Secondary | ICD-10-CM

## 2016-05-22 DIAGNOSIS — R7302 Impaired glucose tolerance (oral): Secondary | ICD-10-CM

## 2016-05-22 DIAGNOSIS — R6889 Other general symptoms and signs: Secondary | ICD-10-CM

## 2016-05-22 DIAGNOSIS — Z0000 Encounter for general adult medical examination without abnormal findings: Secondary | ICD-10-CM

## 2016-05-22 DIAGNOSIS — Z0001 Encounter for general adult medical examination with abnormal findings: Secondary | ICD-10-CM

## 2016-05-22 DIAGNOSIS — E669 Obesity, unspecified: Secondary | ICD-10-CM

## 2016-05-22 DIAGNOSIS — I10 Essential (primary) hypertension: Secondary | ICD-10-CM

## 2016-05-22 LAB — LIPID PANEL
CHOLESTEROL: 189 mg/dL (ref 0–200)
HDL: 46.9 mg/dL (ref >39.00)
LDL Cholesterol: 116 mg/dL — ABNORMAL HIGH (ref 0–99)
NONHDL: 141.97
TRIGLYCERIDES: 129 mg/dL (ref 0.0–149.0)
Total CHOL/HDL Ratio: 4
VLDL: 25.8 mg/dL (ref 0.0–40.0)

## 2016-05-22 LAB — URINALYSIS, ROUTINE W REFLEX MICROSCOPIC
BILIRUBIN URINE: NEGATIVE
HGB URINE DIPSTICK: NEGATIVE
Ketones, ur: NEGATIVE
Leukocytes, UA: NEGATIVE
Nitrite: NEGATIVE
Specific Gravity, Urine: 1.02 (ref 1.000–1.030)
TOTAL PROTEIN, URINE-UPE24: NEGATIVE
UROBILINOGEN UA: 0.2 (ref 0.0–1.0)
Urine Glucose: NEGATIVE
pH: 6 (ref 5.0–8.0)

## 2016-05-22 LAB — CBC WITH DIFFERENTIAL/PLATELET
BASOS PCT: 0.5 % (ref 0.0–3.0)
Basophils Absolute: 0 10*3/uL (ref 0.0–0.1)
EOS PCT: 1.9 % (ref 0.0–5.0)
Eosinophils Absolute: 0.1 10*3/uL (ref 0.0–0.7)
HCT: 38.9 % (ref 36.0–46.0)
HEMOGLOBIN: 13.1 g/dL (ref 12.0–15.0)
LYMPHS ABS: 2 10*3/uL (ref 0.7–4.0)
Lymphocytes Relative: 46 % (ref 12.0–46.0)
MCHC: 33.6 g/dL (ref 30.0–36.0)
MCV: 83.1 fl (ref 78.0–100.0)
MONOS PCT: 5.6 % (ref 3.0–12.0)
Monocytes Absolute: 0.2 10*3/uL (ref 0.1–1.0)
Neutro Abs: 2 10*3/uL (ref 1.4–7.7)
Neutrophils Relative %: 46 % (ref 43.0–77.0)
Platelets: 253 10*3/uL (ref 150.0–400.0)
RBC: 4.68 Mil/uL (ref 3.87–5.11)
RDW: 13.6 % (ref 11.5–15.5)
WBC: 4.4 10*3/uL (ref 4.0–10.5)

## 2016-05-22 LAB — BASIC METABOLIC PANEL
BUN: 17 mg/dL (ref 6–23)
CALCIUM: 9.2 mg/dL (ref 8.4–10.5)
CHLORIDE: 103 meq/L (ref 96–112)
CO2: 29 meq/L (ref 19–32)
CREATININE: 0.99 mg/dL (ref 0.40–1.20)
GFR: 76.54 mL/min (ref >60.00)
GLUCOSE: 145 mg/dL — AB (ref 70–99)
Potassium: 3.3 mEq/L — ABNORMAL LOW (ref 3.5–5.1)
Sodium: 141 mEq/L (ref 135–145)

## 2016-05-22 LAB — HEPATIC FUNCTION PANEL
ALBUMIN: 4 g/dL (ref 3.5–5.2)
ALK PHOS: 85 U/L (ref 39–117)
ALT: 34 U/L (ref 0–35)
AST: 19 U/L (ref 0–37)
BILIRUBIN TOTAL: 0.4 mg/dL (ref 0.2–1.2)
Bilirubin, Direct: 0.1 mg/dL (ref 0.0–0.3)
Total Protein: 6.6 g/dL (ref 6.0–8.3)

## 2016-05-22 LAB — HEMOGLOBIN A1C: HEMOGLOBIN A1C: 6 % (ref 4.6–6.5)

## 2016-05-22 LAB — TSH: TSH: 1.38 u[IU]/mL (ref 0.35–4.50)

## 2016-05-22 MED ORDER — POTASSIUM CHLORIDE ER 10 MEQ PO TBCR: 10.0000 meq | EXTENDED_RELEASE_TABLET | Freq: Every day | ORAL | Status: DC

## 2016-05-22 MED ORDER — OLMESARTAN-AMLODIPINE-HCTZ 40-5-12.5 MG PO TABS: 1.0000 | ORAL_TABLET | Freq: Every day | ORAL | Status: DC

## 2016-05-22 NOTE — Telephone Encounter (Signed)
rx for K done hardcopy as no pharmacy listed as of last visit

## 2016-05-22 NOTE — Patient Instructions (Signed)
Your EKG was OK today  Please continue all other medications as before, and refills have been done if requested.  Please have the pharmacy call with any other refills you may need.  Please continue your efforts at being more active, low cholesterol diet, and weight control.  You are otherwise up to date with prevention measures today.  Please keep your appointments with your specialists as you may have planned  Please go to the LAB in the Basement (turn left off the elevator) for the tests to be done today  You will be contacted by phone if any changes need to be made immediately.  Otherwise, you will receive a letter about your results with an explanation, but please check with MyChart first.  Please remember to sign up for MyChart if you have not done so, as this will be important to you in the future with finding out test results, communicating by private email, and scheduling acute appointments online when needed.  Please return in 1 year for your yearly visit, or sooner if needed, with Lab testing done 3-5 days before  

## 2016-05-22 NOTE — Progress Notes (Signed)
Pre visit review using our clinic review tool, if applicable. No additional management support is needed unless otherwise documented below in the visit note. 

## 2016-05-22 NOTE — Telephone Encounter (Signed)
Medication refill sent to pharmacy  

## 2016-05-22 NOTE — Telephone Encounter (Signed)
Medication faxed to [facility]

## 2016-05-22 NOTE — Progress Notes (Signed)
Subjective:    Patient ID: Martha Alvarado, female    DOB: [birth year], 50 y.o.   MRN: [id number]  HPI  Here for wellness and f/u;  Overall doing ok;  Pt denies Chest pain, worsening SOB, DOE, wheezing, orthopnea, PND, worsening LE edema, palpitations, dizziness or syncope.  Pt denies neurological change such as new headache, facial or extremity weakness.  Pt denies polydipsia, polyuria, or low sugar symptoms. Pt states overall good compliance with treatment and medications, good tolerability, and has been trying to follow appropriate diet.  Pt denies worsening depressive symptoms, suicidal ideation or panic. No fever, night sweats, wt loss, loss of appetite, or other constitutional symptoms.  Pt states good ability with ADL's, has low fall risk, home safety reviewed and adequate, no other significant changes in hearing or vision, and only occasionally active with exercise. Wt Readings from Last 3 Encounters:  05/22/16 219 lb (99.338 kg)  05/20/15 218 lb (98.884 kg)  05/18/14 211 lb 2 oz (95.766 kg)  Pt continues to have recurring LBP without change in severity, bowel or bladder change, fever, wt loss,  worsening LE pain/numbness/weakness, gait change or falls.  Pt denies polydipsia, polyuria, or low sugar symptoms such as weakness or confusion improved with po intake.  Pt states overall good compliance with meds, trying to follow lower cholesterol, diabetic diet, wt overall stable but little exercise however.    Past Medical History  Diagnosis Date  . CHLAMYDIAL INFECTION, HX OF 09/13/2007  . GLUCOSE INTOLERANCE 04/26/2009  . HYPERTENSION 09/13/2007  . OBESITY NOS 09/13/2007  . SINUSITIS- ACUTE-NOS 06/07/2010  . SYPHILIS 09/13/2007  . Impaired glucose tolerance 07/08/2011  . Hyperlipidemia 05/20/2015   Past Surgical History  Procedure Laterality Date  . Tubal ligation  1991    reports that she has never smoked. She has never used smokeless tobacco. She reports that she does not drink alcohol or use  illicit drugs. family history includes Diabetes in her mother, other, and sister; Hypertension in her other, sister, and sister. No Known Allergies Current Outpatient Prescriptions on File Prior to Visit  Medication Sig Dispense Refill  . aspirin 81 MG tablet Take 81 mg by mouth daily.       No current facility-administered medications on file prior to visit.     Review of Systems Constitutional: Negative for increased diaphoresis, or other activity, appetite or siginficant weight change other than noted HENT: Negative for worsening hearing loss, ear pain, facial swelling, mouth sores and neck stiffness.   Eyes: Negative for other worsening pain, redness or visual disturbance.  Respiratory: Negative for choking or stridor Cardiovascular: Negative for other chest pain and palpitations.  Gastrointestinal: Negative for worsening diarrhea, blood in stool, or abdominal distention Genitourinary: Negative for hematuria, flank pain or change in urine volume.  Musculoskeletal: Negative for myalgias or other joint complaints.  Skin: Negative for other color change and wound or drainage.  Neurological: Negative for syncope and numbness. other than noted Hematological: Negative for adenopathy. or other swelling Psychiatric/Behavioral: Negative for hallucinations, SI, self-injury, decreased concentration or other worsening agitation.      Objective:   Physical Exam BP 124/80 mmHg  Pulse 66  Temp(Src) 98.7 F (37.1 C) (Oral)  Resp 20  Wt 219 lb (99.338 kg)  SpO2 97% VS noted,  Constitutional: Pt is oriented to person, place, and time. Appears well-developed and well-nourished, in no significant distress Head: Normocephalic and atraumatic  Eyes: Conjunctivae and EOM are normal. Pupils are equal, round, and reactive to  light Right Ear: External ear normal.  Left Ear: External ear normal Nose: Nose normal.  Mouth/Throat: Oropharynx is clear and moist  Neck: Normal range of motion. Neck  supple. No JVD present. No tracheal deviation present or significant neck LA or mass Cardiovascular: Normal rate, regular rhythm, normal heart sounds and intact distal pulses.   Pulmonary/Chest: Effort normal and breath sounds without rales or wheezing  Abdominal: Soft. Bowel sounds are normal. NT. No HSM  Musculoskeletal: Normal range of motion. Exhibits no edema Lymphadenopathy: Has no cervical adenopathy.  Neurological: Pt is alert and oriented to person, place, and time. Pt has normal reflexes. No cranial nerve deficit. Motor grossly intact Skin: Skin is warm and dry. No rash noted or new ulcers Psychiatric:  Has normal mood and affect. Behavior is normal.   Todays ECG: Sinus  Bradycardia  Voltage criteria for LVH  (R(I)+S(III) exceeds 2.50 mV)  -Voltage criteria w/o ST/T abnormality may be normal.   -Poor R-wave progression -nonspecific -consider old anterior infarct.   -  Nonspecific T-abnormality.   ABNORMAL      Assessment & Plan:

## 2016-05-28 NOTE — Assessment & Plan Note (Addendum)
stable overall by history and exam, recent data reviewed with pt, and pt to continue medical treatment as before,  to f/u any worsening symptoms or concerns BP Readings from Last 3 Encounters:  05/22/16 124/80  05/20/15 126/88  05/18/14 120/84   In addition to the time spent performing CPE, I spent an additional 15 minutes face to face,in which greater than 50% of this time was spent in counseling and coordination of care for patient's illness as documented.

## 2016-05-28 NOTE — Assessment & Plan Note (Signed)
Unable to lose signficant wt, but plans to be more active and reduce calories,  to f/u any worsening symptoms or concerns

## 2016-05-28 NOTE — Assessment & Plan Note (Signed)
stable overall by history and exam, recent data reviewed with pt, and pt to continue medical treatment as before,  to f/u any worsening symptoms or concerns Lab Results  Component Value Date   HGBA1C 6.0 05/22/2016

## 2016-05-28 NOTE — Assessment & Plan Note (Signed)
stable overall by history and exam, recent data reviewed with pt, and pt to continue medical treatment as before,  to f/u any worsening symptoms or concerns Lab Results  Component Value Date   LDLCALC 116* 05/22/2016

## 2016-05-28 NOTE — Assessment & Plan Note (Signed)

## 2016-06-04 ENCOUNTER — Telehealth: Payer: Self-pay | Admitting: *Deleted

## 2016-06-04 NOTE — Telephone Encounter (Signed)
Left msg on triage requesting call back. Called pt back she states [facility] has been trying to get in contact with MD before filling her prescriptions. Inform pt will cont [facility] to check status. Called [facility] spoke w/pharmacist he states they have been trying to get refill on pt Potassium & Tribenzor. Inform per chart rx was sent on 5/23, he stated they never receive call authorization verbally...Johny Chess

## 2016-06-06 ENCOUNTER — Other Ambulatory Visit: Payer: Self-pay | Admitting: *Deleted

## 2017-02-01 ENCOUNTER — Telehealth: Payer: Self-pay

## 2017-02-01 ENCOUNTER — Encounter: Payer: Self-pay | Admitting: Internal Medicine

## 2017-02-01 MED ORDER — POTASSIUM CHLORIDE ER 10 MEQ PO TBCR: 10.0000 meq | EXTENDED_RELEASE_TABLET | Freq: Every day | ORAL | 3 refills | Status: DC

## 2017-02-01 MED ORDER — OLMESARTAN-AMLODIPINE-HCTZ 40-5-12.5 MG PO TABS: 1.0000 | ORAL_TABLET | Freq: Every day | ORAL | 3 refills | Status: DC

## 2017-02-01 NOTE — Telephone Encounter (Signed)
Sent to new pharmacy

## 2017-02-27 ENCOUNTER — Other Ambulatory Visit: Payer: Self-pay | Admitting: Gynecology

## 2017-02-27 DIAGNOSIS — Z1231 Encounter for screening mammogram for malignant neoplasm of breast: Secondary | ICD-10-CM

## 2017-03-14 ENCOUNTER — Ambulatory Visit
Admission: RE | Admit: 2017-03-14 | Discharge: 2017-03-14 | Disposition: A | Discharge status: Home / Self Care | Payer: 59 | Source: Ambulatory Visit | Attending: Gynecology | Admitting: Gynecology

## 2017-03-14 DIAGNOSIS — Z1231 Encounter for screening mammogram for malignant neoplasm of breast: Secondary | ICD-10-CM

## 2017-05-23 ENCOUNTER — Encounter: Payer: Self-pay | Admitting: Internal Medicine

## 2017-05-23 ENCOUNTER — Other Ambulatory Visit (INDEPENDENT_AMBULATORY_CARE_PROVIDER_SITE_OTHER): Payer: 59

## 2017-05-23 ENCOUNTER — Ambulatory Visit (INDEPENDENT_AMBULATORY_CARE_PROVIDER_SITE_OTHER): Payer: 59 | Admitting: Internal Medicine

## 2017-05-23 VITALS — BP 126/86 | HR 69 | Ht 66.0 in | Wt 210.0 lb

## 2017-05-23 DIAGNOSIS — Z114 Encounter for screening for human immunodeficiency virus [HIV]: Secondary | ICD-10-CM

## 2017-05-23 DIAGNOSIS — Z0000 Encounter for general adult medical examination without abnormal findings: Secondary | ICD-10-CM

## 2017-05-23 DIAGNOSIS — R7302 Impaired glucose tolerance (oral): Secondary | ICD-10-CM

## 2017-05-23 LAB — BASIC METABOLIC PANEL
BUN: 20 mg/dL (ref 6–23)
CALCIUM: 9.8 mg/dL (ref 8.4–10.5)
CO2: 31 meq/L (ref 19–32)
Chloride: 100 mEq/L (ref 96–112)
Creatinine, Ser: 1.04 mg/dL (ref 0.40–1.20)
GFR: 72.01 mL/min (ref >60.00)
GLUCOSE: 101 mg/dL — AB (ref 70–99)
Potassium: 3.6 mEq/L (ref 3.5–5.1)
SODIUM: 138 meq/L (ref 135–145)

## 2017-05-23 LAB — HEPATIC FUNCTION PANEL
ALBUMIN: 4.6 g/dL (ref 3.5–5.2)
ALK PHOS: 77 U/L (ref 39–117)
ALT: 28 U/L (ref 0–35)
AST: 21 U/L (ref 0–37)
Bilirubin, Direct: 0.1 mg/dL (ref 0.0–0.3)
TOTAL PROTEIN: 7.5 g/dL (ref 6.0–8.3)
Total Bilirubin: 0.5 mg/dL (ref 0.2–1.2)

## 2017-05-23 LAB — URINALYSIS, ROUTINE W REFLEX MICROSCOPIC
Bilirubin Urine: NEGATIVE
HGB URINE DIPSTICK: NEGATIVE
Ketones, ur: NEGATIVE
LEUKOCYTES UA: NEGATIVE
Nitrite: NEGATIVE
RBC / HPF: NONE SEEN (ref 0–?)
Specific Gravity, Urine: 1.015 (ref 1.000–1.030)
TOTAL PROTEIN, URINE-UPE24: NEGATIVE
UROBILINOGEN UA: 0.2 (ref 0.0–1.0)
Urine Glucose: NEGATIVE
WBC, UA: NONE SEEN (ref 0–?)
pH: 6 (ref 5.0–8.0)

## 2017-05-23 LAB — LIPID PANEL
CHOLESTEROL: 201 mg/dL — AB (ref 0–200)
HDL: 53.4 mg/dL (ref >39.00)
LDL Cholesterol: 133 mg/dL — ABNORMAL HIGH (ref 0–99)
NonHDL: 147.51
Total CHOL/HDL Ratio: 4
Triglycerides: 75 mg/dL (ref 0.0–149.0)
VLDL: 15 mg/dL (ref 0.0–40.0)

## 2017-05-23 LAB — CBC WITH DIFFERENTIAL/PLATELET
BASOS ABS: 0 10*3/uL (ref 0.0–0.1)
Basophils Relative: 0.3 % (ref 0.0–3.0)
EOS ABS: 0.1 10*3/uL (ref 0.0–0.7)
Eosinophils Relative: 1.1 % (ref 0.0–5.0)
HCT: 40.6 % (ref 36.0–46.0)
Hemoglobin: 13.7 g/dL (ref 12.0–15.0)
LYMPHS ABS: 2.7 10*3/uL (ref 0.7–4.0)
Lymphocytes Relative: 53.5 % — ABNORMAL HIGH (ref 12.0–46.0)
MCHC: 33.8 g/dL (ref 30.0–36.0)
MCV: 83.5 fl (ref 78.0–100.0)
MONO ABS: 0.4 10*3/uL (ref 0.1–1.0)
MONOS PCT: 7.1 % (ref 3.0–12.0)
NEUTROS PCT: 38 % — AB (ref 43.0–77.0)
Neutro Abs: 1.9 10*3/uL (ref 1.4–7.7)
Platelets: 282 10*3/uL (ref 150.0–400.0)
RBC: 4.86 Mil/uL (ref 3.87–5.11)
RDW: 13.6 % (ref 11.5–15.5)
WBC: 5 10*3/uL (ref 4.0–10.5)

## 2017-05-23 LAB — HEMOGLOBIN A1C: HEMOGLOBIN A1C: 5.9 % (ref 4.6–6.5)

## 2017-05-23 LAB — TSH: TSH: 2.06 u[IU]/mL (ref 0.35–4.50)

## 2017-05-23 NOTE — Assessment & Plan Note (Signed)

## 2017-05-23 NOTE — Assessment & Plan Note (Signed)
stable overall by history and exam, recent data reviewed with pt, and pt to continue medical treatment as before,  to f/u any worsening symptoms or concerns, for a1c today with labs, cont to work on diet and wt loss

## 2017-05-23 NOTE — Patient Instructions (Addendum)

## 2017-05-23 NOTE — Progress Notes (Signed)
Subjective:    Patient ID: Martha Alvarado, female    DOB: Aug 27, 1966, 51 y.o.   MRN: [id number]  HPI  Here for wellness and f/u;  Overall doing ok;  Pt denies Chest pain, worsening SOB, DOE, wheezing, orthopnea, PND, worsening LE edema, palpitations, dizziness or syncope.  Pt denies neurological change such as new headache, facial or extremity weakness.  Pt denies polydipsia, polyuria, or low sugar symptoms. Pt states overall good compliance with treatment and medications, good tolerability, and has been trying to follow appropriate diet.  Pt denies worsening depressive symptoms, suicidal ideation or panic. No fever, night sweats, wt loss, loss of appetite, or other constitutional symptoms.  Pt states good ability with ADL's, has low fall risk, home safety reviewed and adequate, no other significant changes in hearing or vision, and only occasionally active with exercise.  Husband goes to the gym, and after seeing her wt today, she is now willing to start going with him  Also Plans to call for f/u pap.  No other new hx Past Medical History:  Diagnosis Date  . CHLAMYDIAL INFECTION, HX OF 09/13/2007  . GLUCOSE INTOLERANCE 04/26/2009  . Hyperlipidemia 05/20/2015  . HYPERTENSION 09/13/2007  . Impaired glucose tolerance 07/08/2011  . OBESITY NOS 09/13/2007  . SINUSITIS- ACUTE-NOS 06/07/2010  . SYPHILIS 09/13/2007   Past Surgical History:  Procedure Laterality Date  . TUBAL LIGATION  1991    reports that she has never smoked. She has never used smokeless tobacco. She reports that she does not drink alcohol or use drugs. family history includes Diabetes in her mother, other, and sister; Hypertension in her other, sister, and sister. No Known Allergies Current Outpatient Prescriptions on File Prior to Visit  Medication Sig Dispense Refill  . aspirin 81 MG tablet Take 81 mg by mouth daily.      . Olmesartan-Amlodipine-HCTZ (TRIBENZOR) 40-5-12.5 MG TABS Take 1 tablet by mouth daily. 90 tablet 3  .  potassium chloride (K-DUR) 10 MEQ tablet Take 1 tablet (10 mEq total) by mouth daily. 90 tablet 3   No current facility-administered medications on file prior to visit.    Review of Systems Constitutional: Negative for other unusual diaphoresis, sweats, appetite or weight changes HENT: Negative for other worsening hearing loss, ear pain, facial swelling, mouth sores or neck stiffness.   Eyes: Negative for other worsening pain, redness or other visual disturbance.  Respiratory: Negative for other stridor or swelling Cardiovascular: Negative for other palpitations or other chest pain  Gastrointestinal: Negative for worsening diarrhea or loose stools, blood in stool, distention or other pain Genitourinary: Negative for hematuria, flank pain or other change in urine volume.  Musculoskeletal: Negative for myalgias or other joint swelling.  Skin: Negative for other color change, or other wound or worsening drainage.  Neurological: Negative for other syncope or numbness. Hematological: Negative for other adenopathy or swelling Psychiatric/Behavioral: Negative for hallucinations, other worsening agitation, SI, self-injury, or new decreased concentration All other system neg per pt    Objective:   Physical Exam BP 126/86   Pulse 69   Ht 5\' 6"  (1.676 m)   Wt 210 lb (95.3 kg)   SpO2 98%   BMI 33.89 kg/m  VS noted, obese Constitutional: Pt is oriented to person, place, and time. Appears well-developed and well-nourished, in no significant distress and comfortable Head: Normocephalic and atraumatic  Eyes: Conjunctivae and EOM are normal. Pupils are equal, round, and reactive to light Right Ear: External ear normal without discharge Left Ear: External  ear normal without discharge Nose: Nose without discharge or deformity Mouth/Throat: Oropharynx is without other ulcerations and moist  Neck: Normal range of motion. Neck supple. No JVD present. No tracheal deviation present or significant neck LA  or mass Cardiovascular: Normal rate, regular rhythm, normal heart sounds and intact distal pulses.   Pulmonary/Chest: WOB normal and breath sounds without rales or wheezing  Abdominal: Soft. Bowel sounds are normal. NT. No HSM  Musculoskeletal: Normal range of motion. Exhibits no edema Lymphadenopathy: Has no other cervical adenopathy.  Neurological: Pt is alert and oriented to person, place, and time. Pt has normal reflexes. No cranial nerve deficit. Motor grossly intact, Gait intact Skin: Skin is warm and dry. No rash noted or new ulcerations Psychiatric:  Has normal mood and affect. Behavior is normal without agitation No other exam findings  Lab Results  Component Value Date   WBC 4.4 05/22/2016   HGB 13.1 05/22/2016   HCT 38.9 05/22/2016   PLT 253.0 05/22/2016   GLUCOSE 145 (H) 05/22/2016   CHOL 189 05/22/2016   TRIG 129.0 05/22/2016   HDL 46.90 05/22/2016   LDLCALC 116 (H) 05/22/2016   ALT 34 05/22/2016   AST 19 05/22/2016   NA 141 05/22/2016   K 3.3 (L) 05/22/2016   CL 103 05/22/2016   CREATININE 0.99 05/22/2016   BUN 17 05/22/2016   CO2 29 05/22/2016   TSH 1.38 05/22/2016   HGBA1C 6.0 05/22/2016        Assessment & Plan:

## 2017-05-30 DIAGNOSIS — H35033 Hypertensive retinopathy, bilateral: Secondary | ICD-10-CM | POA: Diagnosis not present

## 2017-05-30 DIAGNOSIS — I10 Essential (primary) hypertension: Secondary | ICD-10-CM | POA: Diagnosis not present

## 2017-05-30 DIAGNOSIS — H5203 Hypermetropia, bilateral: Secondary | ICD-10-CM | POA: Diagnosis not present

## 2017-05-30 DIAGNOSIS — H524 Presbyopia: Secondary | ICD-10-CM | POA: Diagnosis not present

## 2017-05-30 DIAGNOSIS — H52223 Regular astigmatism, bilateral: Secondary | ICD-10-CM | POA: Diagnosis not present

## 2017-06-03 ENCOUNTER — Encounter: Payer: Self-pay | Admitting: Internal Medicine

## 2017-07-19 ENCOUNTER — Ambulatory Visit (AMBULATORY_SURGERY_CENTER): Payer: Self-pay | Admitting: *Deleted

## 2017-07-19 VITALS — Ht 65.5 in | Wt 214.4 lb

## 2017-07-19 DIAGNOSIS — Z1211 Encounter for screening for malignant neoplasm of colon: Secondary | ICD-10-CM

## 2017-07-19 NOTE — Progress Notes (Signed)
Denies allergies to eggs or soy products. Denies complications with sedation or anesthesia. Denies O2 use. Denies use of diet or weight loss medications.  Emmi instructions given for colonoscopy.  

## 2017-07-22 ENCOUNTER — Encounter: Payer: Self-pay | Admitting: Internal Medicine

## 2017-07-31 ENCOUNTER — Ambulatory Visit (AMBULATORY_SURGERY_CENTER): Payer: 59 | Admitting: Internal Medicine

## 2017-07-31 ENCOUNTER — Encounter: Payer: Self-pay | Admitting: Internal Medicine

## 2017-07-31 VITALS — BP 126/78 | HR 57 | Temp 98.0°F | Resp 12 | Ht 65.0 in | Wt 214.0 lb

## 2017-07-31 DIAGNOSIS — Z1212 Encounter for screening for malignant neoplasm of rectum: Secondary | ICD-10-CM | POA: Diagnosis not present

## 2017-07-31 DIAGNOSIS — K621 Rectal polyp: Secondary | ICD-10-CM

## 2017-07-31 DIAGNOSIS — Z1211 Encounter for screening for malignant neoplasm of colon: Secondary | ICD-10-CM | POA: Diagnosis present

## 2017-07-31 DIAGNOSIS — K635 Polyp of colon: Secondary | ICD-10-CM

## 2017-07-31 DIAGNOSIS — D129 Benign neoplasm of anus and anal canal: Secondary | ICD-10-CM

## 2017-07-31 DIAGNOSIS — D125 Benign neoplasm of sigmoid colon: Secondary | ICD-10-CM

## 2017-07-31 DIAGNOSIS — D128 Benign neoplasm of rectum: Secondary | ICD-10-CM

## 2017-07-31 MED ORDER — SODIUM CHLORIDE 0.9 % IV SOLN: 500.0000 mL | INTRAVENOUS | Status: AC

## 2017-07-31 NOTE — Progress Notes (Signed)
Called to room to assist during endoscopic procedure.  Patient ID and intended procedure confirmed with present staff. Received instructions for my participation in the procedure from the performing physician.  

## 2017-07-31 NOTE — Op Note (Signed)
[facility] Patient Name: Martha Alvarado Procedure Date: 07/31/2017 9:41 AM MRN: [id number] Endoscopist: Gatha Mayer , MD Age: 51 Referring MD:  Date of Birth: 1966-03-25 Gender: Female Account #: [id number] Procedure:                Colonoscopy Indications:              Screening for colorectal malignant neoplasm, This                            is the patient's first colonoscopy Medicines:                Propofol per Anesthesia, Monitored Anesthesia Care Procedure:                Pre-Anesthesia Assessment:                           - Prior to the procedure, a History and Physical                            was performed, and patient medications and                            allergies were reviewed. The patient's tolerance of                            previous anesthesia was also reviewed. The risks                            and benefits of the procedure and the sedation                            options and risks were discussed with the patient.                            All questions were answered, and informed consent                            was obtained. Prior Anticoagulants: The patient has                            taken no previous anticoagulant or antiplatelet                            agents. ASA Grade Assessment: II - A patient with                            mild systemic disease. After reviewing the risks                            and benefits, the patient was deemed in                            satisfactory condition to undergo the procedure.  After obtaining informed consent, the colonoscope                            was passed under direct vision. Throughout the                            procedure, the patient's blood pressure, pulse, and                            oxygen saturations were monitored continuously. The                            Colonoscope was introduced through the anus and   advanced to the the cecum, identified by                            appendiceal orifice and ileocecal valve. The                            colonoscopy was performed without difficulty. The                            patient tolerated the procedure well. The quality                            of the bowel preparation was excellent. The bowel                            preparation used was Miralax. The ileocecal valve,                            appendiceal orifice, and rectum were photographed. Scope In: 9:46:56 AM Scope Out: 10:02:20 AM Scope Withdrawal Time: 0 hours 11 minutes 57 seconds  Total Procedure Duration: 0 hours 15 minutes 24 seconds  Findings:                 The perianal and digital rectal examinations were                            normal.                           Two sessile polyps were found in the rectum and                            sigmoid colon. The polyps were diminutive in size.                            These polyps were removed with a cold snare.                            Resection and retrieval were complete. Verification                            of patient identification  for the specimen was                            done. Estimated blood loss was minimal.                           The exam was otherwise without abnormality on                            direct and retroflexion views. Complications:            No immediate complications. Estimated Blood Loss:     Estimated blood loss was minimal. Impression:               - Two diminutive polyps in the rectum and in the                            sigmoid colon, removed with a cold snare. Resected                            and retrieved.                           - The examination was otherwise normal on direct                            and retroflexion views. Recommendation:           - Patient has a contact number available for                            emergencies. The signs and symptoms of  potential                            delayed complications were discussed with the                            patient. Return to normal activities tomorrow.                            Written discharge instructions were provided to the                            patient.                           - Resume previous diet.                           - Continue present medications.                           - Repeat colonoscopy is recommended. The                            colonoscopy date will be determined after pathology  results from today's exam become available for                            review. Gatha Mayer, MD 07/31/2017 10:09:47 AM This report has been signed electronically.

## 2017-07-31 NOTE — Progress Notes (Signed)
No problems noted in the recovery room. maw 

## 2017-07-31 NOTE — Patient Instructions (Addendum)
 I found and removed 2 small polyps. I will let you know pathology results and when to have another routine colonoscopy by mail and/or My Chart.  I appreciate the opportunity to care for you. Carl E. Gessner, MD, FACG    YOU HAD AN ENDOSCOPIC PROCEDURE TODAY AT THE [facility]:   Refer to the procedure report that was given to you for any specific questions about what was found during the examination.  If the procedure report does not answer your questions, please call your gastroenterologist to clarify.  If you requested that your care partner not be given the details of your procedure findings, then the procedure report has been included in a sealed envelope for you to review at your convenience later.  YOU SHOULD EXPECT: Some feelings of bloating in the abdomen. Passage of more gas than usual.  Walking can help get rid of the air that was put into your GI tract during the procedure and reduce the bloating. If you had a lower endoscopy (such as a colonoscopy or flexible sigmoidoscopy) you may notice spotting of blood in your stool or on the toilet paper. If you underwent a bowel prep for your procedure, you may not have a normal bowel movement for a few days.  Please Note:  You might notice some irritation and congestion in your nose or some drainage.  This is from the oxygen used during your procedure.  There is no need for concern and it should clear up in a day or so.  SYMPTOMS TO REPORT IMMEDIATELY:   Following lower endoscopy (colonoscopy or flexible sigmoidoscopy):  Excessive amounts of blood in the stool  Significant tenderness or worsening of abdominal pains  Swelling of the abdomen that is new, acute  Fever of 100F or higher  For urgent or emergent issues, a gastroenterologist can be reached at any hour by calling [phone].   DIET:  We do recommend a small meal at first, but then you may proceed to your regular diet.  Drink plenty of fluids but you  should avoid alcoholic beverages for 24 hours.  ACTIVITY:  You should plan to take it easy for the rest of today and you should NOT DRIVE or use heavy machinery until tomorrow (because of the sedation medicines used during the test).    FOLLOW UP: Our staff will call the number listed on your records the next business day following your procedure to check on you and address any questions or concerns that you may have regarding the information given to you following your procedure. If we do not reach you, we will leave a message.  However, if you are feeling well and you are not experiencing any problems, there is no need to return our call.  We will assume that you have returned to your regular daily activities without incident.  If any biopsies were taken you will be contacted by phone or by letter within the next 1-3 weeks.  Please call us at [phone] if you have not heard about the biopsies in 3 weeks.    SIGNATURES/CONFIDENTIALITY: You and/or your care partner have signed paperwork which will be entered into your electronic medical record.  These signatures attest to the fact that that the information above on your After Visit Summary has been reviewed and is understood.  Full responsibility of the confidentiality of this discharge information lies with you and/or your care-partner.    Handout was given to your care partner on polyps. You   may resume your current medications today. Await biopsy results. Please call if any questions or concerns.   

## 2017-07-31 NOTE — Progress Notes (Signed)
A/ox3 pleased with MAC, report to Annette RN 

## 2017-08-01 ENCOUNTER — Telehealth: Payer: Self-pay | Admitting: *Deleted

## 2017-08-01 NOTE — Telephone Encounter (Signed)
  Follow up Call-  Call back number 07/31/2017  Post procedure Call Back phone  # [phone]  Permission to leave phone message Yes  Some recent data might be hidden     Patient questions:  Do you have a fever, pain , or abdominal swelling? No. Pain Score  0 *  Have you tolerated food without any problems? Yes.    Have you been able to return to your normal activities? Yes.    Do you have any questions about your discharge instructions: Diet   No. Medications  No. Follow up visit  No.  Do you have questions or concerns about your Care? No.  Actions: * If pain score is 4 or above: No action needed, pain <4.

## 2017-08-06 ENCOUNTER — Encounter: Payer: Self-pay | Admitting: Internal Medicine

## 2017-08-06 NOTE — Progress Notes (Signed)
Distal hyperplastic polyps recall 2028 My Chart letter

## 2017-12-17 ENCOUNTER — Ambulatory Visit (HOSPITAL_COMMUNITY)
Admission: EM | Admit: 2017-12-17 | Discharge: 2017-12-17 | Disposition: A | Discharge status: Home / Self Care | Payer: 59 | Attending: Family Medicine | Admitting: Family Medicine

## 2017-12-17 ENCOUNTER — Encounter (HOSPITAL_COMMUNITY): Payer: Self-pay | Admitting: Family Medicine

## 2017-12-17 DIAGNOSIS — S39012A Strain of muscle, fascia and tendon of lower back, initial encounter: Secondary | ICD-10-CM | POA: Diagnosis not present

## 2017-12-17 MED ORDER — CYCLOBENZAPRINE HCL 5 MG PO TABS: 5.0000 mg | ORAL_TABLET | Freq: Every day | ORAL | 0 refills | Status: DC

## 2017-12-17 MED ORDER — PREDNISONE 20 MG PO TABS: ORAL_TABLET | ORAL | 0 refills | Status: DC

## 2017-12-17 NOTE — Discharge Instructions (Signed)
Expect improvement in the next 24-48 hours. If you're not finding improvement, please return or see her primary care doctor.

## 2017-12-17 NOTE — ED Provider Notes (Signed)
[place]   [id number] 12/17/17 Arrival Time: 1005   SUBJECTIVE:  Martha Alvarado is a 51 y.o. female who presents to the urgent care with complaint of back pain.   Pain began 2 days ago and is worsening.  She works as a Quarry manager which includes some lifting.    Past Medical History:  Diagnosis Date  . CHLAMYDIAL INFECTION, HX OF 09/13/2007  . GLUCOSE INTOLERANCE 04/26/2009  . Hyperlipidemia 05/20/2015  . HYPERTENSION 09/13/2007  . Impaired glucose tolerance 07/08/2011  . OBESITY NOS 09/13/2007  . SINUSITIS- ACUTE-NOS 06/07/2010  . SYPHILIS 09/13/2007   Family History  Problem Relation Age of Onset  . Diabetes Mother   . Diabetes Other   . Hypertension Other   . Diabetes Sister   . Hypertension Sister   . Hypertension Sister   . Colon cancer Neg Hx   . Esophageal cancer Neg Hx   . Rectal cancer Neg Hx   . Stomach cancer Neg Hx    Social History   Socioeconomic History  . Marital status: Married    Spouse name: Not on file  . Number of children: Not on file  . Years of education: Not on file  . Highest education level: Not on file  Social Needs  . Financial resource strain: Not on file  . Food insecurity - worry: Not on file  . Food insecurity - inability: Not on file  . Transportation needs - medical: Not on file  . Transportation needs - non-medical: Not on file  Occupational History  . Not on file  Tobacco Use  . Smoking status: Never Smoker  . Smokeless tobacco: Never Used  Substance and Sexual Activity  . Alcohol use: No  . Drug use: No  . Sexual activity: Yes  Other Topics Concern  . Not on file  Social History Narrative  . Not on file   Current Facility-Administered Medications for the 12/17/17 encounter [facility] Encounter)  Medication  . 0.9 %  sodium chloride infusion   No outpatient medications have been marked as taking for the 12/17/17 encounter [facility] Encounter).   No Known Allergies  Current Facility-Administered Medications on  File Prior to Encounter  Medication Dose Route Frequency Provider Last Rate Last Dose  . 0.9 %  sodium chloride infusion  500 mL Intravenous Continuous Gatha Mayer, MD       Current Outpatient Medications on File Prior to Encounter  Medication Sig Dispense Refill  . aspirin 81 MG tablet Take 81 mg by mouth daily.      . Olmesartan-Amlodipine-HCTZ (TRIBENZOR) 40-5-12.5 MG TABS Take 1 tablet by mouth daily. 90 tablet 3  . potassium chloride (K-DUR) 10 MEQ tablet Take 1 tablet (10 mEq total) by mouth daily. 90 tablet 3    ROS: As per HPI, remainder of ROS negative.   OBJECTIVE:   Vitals:   12/17/17 1042  BP: (!) 131/93  Pulse: (!) 103  Resp: 18  Temp: 98.2 F (36.8 C)  SpO2: 97%     General appearance: alert; no distress Eyes: PERRL; EOMI; conjunctiva normal HENT: normocephalic; atraumatic, external ears normal without trauma; nasal mucosa normal; oral mucosa normal Neck: supple Abdomen: soft, non-tender; bowel sounds normal; no masses or organomegaly; no guarding or rebound tenderness Back: no CVA tenderness;  Mild right L5 paralumbar tenderness with deep palpation Extremities: no cyanosis or edema; symmetrical with no gross deformities; no weakness Skin: warm and dry Neurologic: normal gait; grossly normal;  Mild right SLR pain in right  low back at 90 degrees. Psychological: alert and cooperative; normal mood and affect      Labs:  Results for orders placed or performed in visit on 05/23/17  Hemoglobin A1c  Result Value Ref Range   Hgb A1c MFr Bld 5.9 4.6 - 6.5 %  Lipid panel  Result Value Ref Range   Cholesterol 201 (H) 0 - 200 mg/dL   Triglycerides 75.0 0.0 - 149.0 mg/dL   HDL 53.40 >39.00 mg/dL   VLDL 15.0 0.0 - 40.0 mg/dL   LDL Cholesterol 133 (H) 0 - 99 mg/dL   Total CHOL/HDL Ratio 4    NonHDL 500.93   Basic metabolic panel  Result Value Ref Range   Sodium 138 135 - 145 mEq/L   Potassium 3.6 3.5 - 5.1 mEq/L   Chloride 100 96 - 112 mEq/L   CO2 31  19 - 32 mEq/L   Glucose, Bld 101 (H) 70 - 99 mg/dL   BUN 20 6 - 23 mg/dL   Creatinine, Ser 1.04 0.40 - 1.20 mg/dL   Calcium 9.8 8.4 - 10.5 mg/dL   GFR 72.01 >60.00 mL/min  Hepatic function panel  Result Value Ref Range   Total Bilirubin 0.5 0.2 - 1.2 mg/dL   Bilirubin, Direct 0.1 0.0 - 0.3 mg/dL   Alkaline Phosphatase 77 39 - 117 U/L   AST 21 0 - 37 U/L   ALT 28 0 - 35 U/L   Total Protein 7.5 6.0 - 8.3 g/dL   Albumin 4.6 3.5 - 5.2 g/dL  CBC with Differential/Platelet  Result Value Ref Range   WBC 5.0 4.0 - 10.5 K/uL   RBC 4.86 3.87 - 5.11 Mil/uL   Hemoglobin 13.7 12.0 - 15.0 g/dL   HCT 40.6 36.0 - 46.0 %   MCV 83.5 78.0 - 100.0 fl   MCHC 33.8 30.0 - 36.0 g/dL   RDW 13.6 11.5 - 15.5 %   Platelets 282.0 150.0 - 400.0 K/uL   Neutrophils Relative % 38.0 (L) 43.0 - 77.0 %   Lymphocytes Relative 53.5 (H) 12.0 - 46.0 %   Monocytes Relative 7.1 3.0 - 12.0 %   Eosinophils Relative 1.1 0.0 - 5.0 %   Basophils Relative 0.3 0.0 - 3.0 %   Neutro Abs 1.9 1.4 - 7.7 K/uL   Lymphs Abs 2.7 0.7 - 4.0 K/uL   Monocytes Absolute 0.4 0.1 - 1.0 K/uL   Eosinophils Absolute 0.1 0.0 - 0.7 K/uL   Basophils Absolute 0.0 0.0 - 0.1 K/uL  TSH  Result Value Ref Range   TSH 2.06 0.35 - 4.50 uIU/mL  Urinalysis, Routine w reflex microscopic  Result Value Ref Range   Color, Urine YELLOW Yellow;Lt. Yellow   APPearance CLEAR Clear   Specific Gravity, Urine 1.015 1.000 - 1.030   pH 6.0 5.0 - 8.0   Total Protein, Urine NEGATIVE Negative   Urine Glucose NEGATIVE Negative   Ketones, ur NEGATIVE Negative   Bilirubin Urine NEGATIVE Negative   Hgb urine dipstick NEGATIVE Negative   Urobilinogen, UA 0.2 0.0 - 1.0   Leukocytes, UA NEGATIVE Negative   Nitrite NEGATIVE Negative   WBC, UA none seen 0-2/hpf   RBC / HPF none seen 0-2/hpf    Labs Reviewed - No data to display  No results found.     ASSESSMENT & PLAN:  1. Strain of lumbar region, initial encounter     Meds ordered this encounter    Medications  . predniSONE (DELTASONE) 20 MG tablet    Sig:  Two daily with food    Dispense:  6 tablet    Refill:  0  . cyclobenzaprine (FLEXERIL) 5 MG tablet    Sig: Take 1 tablet (5 mg total) by mouth at bedtime.    Dispense:  7 tablet    Refill:  0    Reviewed expectations re: course of current medical issues. Questions answered. Outlined signs and symptoms indicating need for more acute intervention. Patient verbalized understanding. After Visit Summary given.    Procedures:      Robyn Haber, MD 12/17/17 1051

## 2017-12-17 NOTE — ED Triage Notes (Signed)
Pt here for back pain since Sunday and worsening. Denies injury. 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

## 2018-01-15 ENCOUNTER — Encounter: Payer: Self-pay | Admitting: Family

## 2018-01-15 ENCOUNTER — Ambulatory Visit (INDEPENDENT_AMBULATORY_CARE_PROVIDER_SITE_OTHER)
Admission: RE | Admit: 2018-01-15 | Discharge: 2018-01-15 | Disposition: A | Discharge status: Home / Self Care | Payer: 59 | Source: Ambulatory Visit | Attending: Family | Admitting: Family

## 2018-01-15 ENCOUNTER — Ambulatory Visit: Payer: 59 | Admitting: Family

## 2018-01-15 VITALS — BP 132/82 | HR 69 | Temp 98.3°F | Ht 65.0 in | Wt 219.1 lb

## 2018-01-15 DIAGNOSIS — M5441 Lumbago with sciatica, right side: Secondary | ICD-10-CM

## 2018-01-15 DIAGNOSIS — R05 Cough: Secondary | ICD-10-CM

## 2018-01-15 DIAGNOSIS — M545 Low back pain: Secondary | ICD-10-CM | POA: Diagnosis not present

## 2018-01-15 DIAGNOSIS — R058 Other specified cough: Secondary | ICD-10-CM

## 2018-01-15 MED ORDER — BENZONATATE 200 MG PO CAPS: 200.0000 mg | ORAL_CAPSULE | Freq: Two times a day (BID) | ORAL | 0 refills | Status: DC | PRN

## 2018-01-15 MED ORDER — FLUTICASONE PROPIONATE 50 MCG/ACT NA SUSP: 2.0000 | Freq: Every day | NASAL | 6 refills | Status: DC

## 2018-01-15 NOTE — Progress Notes (Signed)
Martha Alvarado is a 52 y.o. female with the following history as recorded in EpicCare:  Patient Active Problem List   Diagnosis Date Noted  . Hyperlipidemia 05/20/2015  . 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 Early menopause 05/01/2012  . Preventative health care 07/09/2011  . Impaired glucose tolerance 07/08/2011  . SYPHILIS 09/13/2007  . OBESITY NOS 09/13/2007  . Essential hypertension 09/13/2007  . CHLAMYDIAL INFECTION, HX OF 09/13/2007    Current Outpatient Medications  Medication Sig Dispense Refill  . aspirin 81 MG tablet Take 81 mg by mouth daily.      . Olmesartan-Amlodipine-HCTZ (TRIBENZOR) 40-5-12.5 MG TABS Take 1 tablet by mouth daily. 90 tablet 3  . potassium chloride (K-DUR) 10 MEQ tablet Take 1 tablet (10 mEq total) by mouth daily. 90 tablet 3  . benzonatate (TESSALON) 200 MG capsule Take 1 capsule (200 mg total) by mouth 2 (two) times daily as needed for cough. 30 capsule 0  . fluticasone (FLONASE) 50 MCG/ACT nasal spray Place 2 sprays into both nostrils daily. 16 g 6   Current Facility-Administered Medications  Medication Dose Route Frequency Provider Last Rate Last Dose  . 0.9 %  sodium chloride infusion  500 mL Intravenous Continuous Gatha Mayer, MD        Allergies: Patient has no known allergies.  Past Medical History:  Diagnosis Date  . CHLAMYDIAL INFECTION, HX OF 09/13/2007  . GLUCOSE INTOLERANCE 04/26/2009  . Hyperlipidemia 05/20/2015  . HYPERTENSION 09/13/2007  . Impaired glucose tolerance 07/08/2011  . OBESITY NOS 09/13/2007  . SINUSITIS- ACUTE-NOS 06/07/2010  . SYPHILIS 09/13/2007    Past Surgical History:  Procedure Laterality Date  . TUBAL LIGATION  1991    Family History  Problem Relation Age of Onset  . Diabetes Mother   . Diabetes Other   . Hypertension Other   . Diabetes Sister   . Hypertension Sister   . Hypertension Sister   . Colon cancer Neg Hx   . Esophageal cancer Neg Hx   . Rectal cancer Neg Hx   . Stomach cancer Neg Hx     Social  History   Tobacco Use  . Smoking status: Never Smoker  . Smokeless tobacco: Never Used  Substance Use Topics  . 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  Head: Normocephalic and atraumatic  Eyes: Sclera and conjunctiva clear; pupils round and reactive to light; extraocular movements intact  Ears: External normal; canals clear; tympanic membranes normal  Oropharynx: Pink, supple. No suspicious lesions  Neck: Supple without thyromegaly, adenopathy  Lungs: Respirations unlabored; clear to auscultation bilaterally without wheeze, rales, rhonchi  CVS exam: normal rate and regular rhythm.  Neurologic: Alert and oriented; speech intact; face symmetrical; moves all extremities well; CNII-XII intact without focal deficit  Assessment:  1. Post-viral cough syndrome   2. Right-sided low back pain with right-sided sciatica, unspecified chronicity     Plan:  1. Reassurance; do not feel antibiotic needed or CXR at this time; try treating with Flonase and Tessalon Perles; increase fluids, rest and follow-up worse, no better. 2. Update lumbar X-ray; may need to consider PT; follow-up to be determined.   No Follow-up on file.  Orders Placed This  Encounter  Procedures  . DG Lumbar Spine 2-3 Views    Standing Status:   Future    Number of Occurrences:   1    Standing Expiration Date:   03/16/2019    Order Specific Question:   Reason for Exam (SYMPTOM  OR DIAGNOSIS REQUIRED)    Answer:   low back pain with sciatica    Order Specific Question:   Is patient pregnant?    Answer:   No    Order Specific Question:   Preferred imaging location?    Answer:   [facility]    Order Specific Question:   Radiology Contrast Protocol - do NOT remove file path    Answer:   [email or web address]    Requested Prescriptions   Signed Prescriptions Disp Refills  . fluticasone (FLONASE) 50 MCG/ACT nasal spray 16 g 6    Sig: Place 2 sprays into both nostrils daily.  . benzonatate (TESSALON) 200 MG capsule 30 capsule 0    Sig: Take 1 capsule (200 mg total) by mouth 2 (two) times daily as needed for cough.

## 2018-01-17 NOTE — Progress Notes (Signed)
Appointment scheduled.

## 2018-01-21 ENCOUNTER — Encounter: Payer: Self-pay | Admitting: Family Medicine

## 2018-01-21 ENCOUNTER — Ambulatory Visit: Payer: 59 | Admitting: Family Medicine

## 2018-01-21 VITALS — BP 132/68 | HR 68 | Temp 97.7°F | Ht 65.0 in | Wt 217.0 lb

## 2018-01-21 DIAGNOSIS — M5441 Lumbago with sciatica, right side: Secondary | ICD-10-CM | POA: Diagnosis not present

## 2018-01-21 NOTE — Patient Instructions (Signed)
Please try the exercises.  If your pain doesn't improve then we can try some physical therapy.

## 2018-01-21 NOTE — Progress Notes (Signed)
Martha Alvarado - 53 y.o. female MRN [id number]  Date of birth: Nov 01, 1966  SUBJECTIVE:  Including CC & ROS.  Chief Complaint  Patient presents with  . Back Pain    Martha Alvarado is a 52 y.o. female that is presenting with low back and right leg pain.  Pain started two weeks ago. She went to Urgent Care 12/17/17. Denies injury or trauma. 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 She has not been taking anything for the pain. Denies tingling or numbness. Pain is sharp and radiates down her right leg. Certain movements can trigger the pain. Standing increases the pain. Has been provided prednisone with limited improvement.   Independent review of her lumbar spine from 01/15/18 shows degenerative changes.     Review of Systems  Constitutional: Negative for fever.  HENT: Negative for sinus pressure.   Respiratory: Negative for cough.   Cardiovascular: Negative for chest pain.  Gastrointestinal: Negative for abdominal pain.  Musculoskeletal: Positive for back pain. Negative for gait problem.  Skin: Negative for color change.  Neurological: Positive for numbness. Negative for weakness.  Hematological: Negative for adenopathy.  Psychiatric/Behavioral: Negative for agitation.    HISTORY: Past Medical, Surgical, Social, and Family History Reviewed & Updated per EMR.   Pertinent Historical Findings include:  Past Medical History:  Diagnosis Date  . CHLAMYDIAL INFECTION, HX OF 09/13/2007  . GLUCOSE INTOLERANCE 04/26/2009  . Hyperlipidemia 05/20/2015  . HYPERTENSION 09/13/2007  . Impaired glucose tolerance 07/08/2011  . OBESITY NOS 09/13/2007  . SINUSITIS- ACUTE-NOS 06/07/2010  . SYPHILIS 09/13/2007    Past Surgical History:  Procedure Laterality Date  . TUBAL LIGATION  1991    No Known Allergies  Family History  Problem Relation Age of Onset  . Diabetes Mother   . Diabetes Other   . Hypertension Other   . Diabetes Sister   . Hypertension Sister   .  Hypertension Sister   . Colon cancer Neg Hx   . Esophageal cancer Neg Hx   . Rectal cancer Neg Hx   . Stomach cancer Neg Hx      Social History   Socioeconomic History  . Marital status: Married    Spouse name: Not on file  . Number of children: Not on file  . Years of education: Not on file  . Highest education level: Not on file  Social Needs  . Financial resource strain: Not on file  . Food insecurity - worry: Not on file  . Food insecurity - inability: Not on file  . Transportation needs - medical: Not on file  . Transportation needs - non-medical: Not on file  Occupational History  . Not on file  Tobacco Use  . Smoking status: Never Smoker  . Smokeless tobacco: Never Used  Substance and Sexual Activity  . Alcohol use: No  . Drug use: No  . Sexual activity: Yes  Other Topics Concern  . Not on file  Social History Narrative  . Not on file     PHYSICAL EXAM:  VS: BP 132/68 (BP Location: Left Arm, Patient Position: Sitting, Cuff Size: Normal)   Pulse 68   Temp 97.7 F (36.5 C) (Oral)   Ht 5\' 5"  (1.651 m)   Wt 217 lb (98.4 kg)   SpO2 98%   BMI 36.11 kg/m  Physical Exam Gen: NAD, alert, cooperative with exam, well-appearing ENT: normal lips, normal nasal mucosa,  Eye: normal EOM, normal conjunctiva and lids CV:  no edema, +2 pedal pulses   Resp: no accessory muscle use, non-labored,  Skin: no rashes, no areas of induration  Neuro: normal tone, normal sensation to touch Psych:  normal insight, alert and oriented MSK:  Back Exam:  Inspection: Unremarkable  Palpable tenderness: None. Range of Motion:  Flexion 45 deg; Extension 45 deg; Side Bending to 45 deg bilaterally; Rotation to 45 deg bilaterally  Leg strength: Quad: 5/5 Hamstring: 5/5 Hip flexor: 5/5 Hip abductors: 5/5  Strength at foot: Plantar-flexion: 5/5 Dorsi-flexion: 5/5 Eversion: 5/5 Inversion: 5/5  Sensory change: Gross sensation intact to all lumbar and sacral dermatomes.  Reflexes: 2+ at  both patellar tendons,  Gait unremarkable. SLR laying: mildly painful on right  XSLR laying: Negative  FABER: negative. FADIR: negative.  Neurovascularly intact      ASSESSMENT & PLAN:   Acute right-sided low back pain with right-sided sciatica Low back pain likely muscular in nature. Having sciatic type symptoms acute in nature.  - counseled on HEP  - advised to follow up in 4-6 weeks and if no improvement would consider PT

## 2018-01-22 DIAGNOSIS — M5441 Lumbago with sciatica, right side: Secondary | ICD-10-CM | POA: Insufficient documentation

## 2018-01-22 NOTE — Assessment & Plan Note (Signed)
Low back pain likely muscular in nature. Having sciatic type symptoms acute in nature.  - counseled on HEP  - advised to follow up in 4-6 weeks and if no improvement would consider PT

## 2018-01-29 ENCOUNTER — Ambulatory Visit (INDEPENDENT_AMBULATORY_CARE_PROVIDER_SITE_OTHER): Payer: 59 | Admitting: Gynecology

## 2018-01-29 ENCOUNTER — Encounter: Payer: Self-pay | Admitting: Gynecology

## 2018-01-29 VITALS — BP 124/78 | Ht 65.0 in | Wt 218.0 lb

## 2018-01-29 DIAGNOSIS — Z01419 Encounter for gynecological examination (general) (routine) without abnormal findings: Secondary | ICD-10-CM | POA: Diagnosis not present

## 2018-01-29 NOTE — Patient Instructions (Signed)
Follow-up in 1 year, sooner as needed. 

## 2018-01-29 NOTE — Progress Notes (Signed)
    Martha Alvarado 01-13-1966 [id number]        52 y.o.  G2P2002 for annual gynecologic exam.  Former patient of Dr. Toney Rakes.  Doing well without GYN complaints.  Had been on HRT in the past but discontinued for several years and is doing well without hot flushes, night sweats or other menopausal symptoms.  No vaginal bleeding.  Past medical history,surgical history, problem list, medications, allergies, family history and social history were all reviewed and documented as reviewed in the EPIC chart.  ROS:  Performed with pertinent positives and negatives included in the history, assessment and plan.   Additional significant findings : None   Exam: Caryn Bee assistant Vitals:   01/29/18 1056  BP: 124/78  Weight: 218 lb (98.9 kg)  Height: 5\' 5"  (1.651 m)   Body mass index is 36.28 kg/m.  General appearance:  Normal affect, orientation and appearance. Skin: Grossly normal HEENT: Without gross lesions.  No cervical or supraclavicular adenopathy. Thyroid normal.  Lungs:  Clear without wheezing, rales or rhonchi Cardiac: RR, without RMG Abdominal:  Soft, nontender, without masses, guarding, rebound, organomegaly or hernia Breasts:  Examined lying and sitting without masses, retractions, discharge or axillary adenopathy. Pelvic:  Ext, BUS, Vagina: Normal  Cervix: Normal  Uterus: Anteverted, normal size, shape and contour, midline and mobile nontender   Adnexa: Without masses or tenderness    Anus and perineum: Normal   Rectovaginal: Normal sphincter tone without palpated masses or tenderness.    Assessment/Plan:  52 y.o. G65P2002 female for annual gynecologic exam.   1. Postmenopausal.  Doing well without significant hot flushes, night sweats, vaginal dryness or any bleeding.  Continue to monitor and report any issues or bleeding. 2. Mammography coming due in March and I reminded her to schedule this.  Breast exam normal today. 3. Pap smear 2013.  Pap smear/HPV today.  No  history of abnormal Pap smears in the past verbalized. 4. Colonoscopy 2018.  Repeat at their recommended interval. 5. DEXA never.  Will plan further into the menopause. 6. Health maintenance.  No routine lab work done as patient reports this done elsewhere.  Follow-up 1 year, sooner as needed.   Anastasio Auerbach MD, 11:20 AM 01/29/2018

## 2018-01-29 NOTE — Addendum Note (Signed)
Addended by: Nelva Nay on: 01/29/2018 11:29 AM   Modules accepted: Orders

## 2018-02-03 LAB — PAP IG AND HPV HIGH-RISK: HPV DNA High Risk: NOT DETECTED

## 2018-02-14 ENCOUNTER — Telehealth: Payer: Self-pay | Admitting: Internal Medicine

## 2018-02-14 MED ORDER — OLMESARTAN-AMLODIPINE-HCTZ 40-5-12.5 MG PO TABS: 1.0000 | ORAL_TABLET | Freq: Every day | ORAL | 3 refills | Status: DC

## 2018-02-14 MED ORDER — POTASSIUM CHLORIDE ER 10 MEQ PO TBCR: 10.0000 meq | EXTENDED_RELEASE_TABLET | Freq: Every day | ORAL | 3 refills | Status: DC

## 2018-02-14 NOTE — Telephone Encounter (Signed)
Copied from [place] [id number]. Topic: Quick Communication - Rx Refill/Question >> Feb 14, 2018  9:21 AM Antonieta Iba C wrote: Medication: Olmesartan-Amlodipine-HCTZ (TRIBENZOR) 40-5-12.5 MG TABS and also potassium chloride (K-DUR) 10 MEQ tablet    Has the patient contacted their pharmacy? Yes    (Agent: If no, request that the patient contact the pharmacy for the refill.)   Preferred Pharmacy (with phone number or street name): [facility] ([place], [place]   Agent: Please be advised that RX refills may take up to 3 business days. We ask that you follow-up with your pharmacy.

## 2018-02-14 NOTE — Telephone Encounter (Signed)
Medication: Olmesartan-Amlodipine-HCTZ (TRIBENZOR) 40-5-12.5 MG TABS and also potassium chloride (K-DUR) 10 MEQ tablet       Has the patient contacted their pharmacy? Yes       (Agent: If no, request that the patient contact the pharmacy for the refill.)      Preferred Pharmacy (with phone number or street name): [facility] ([place], [place]      Agent: Please be advised that RX refills may take up to 3 business days. We ask that you follow-up with your pharmacy.

## 2018-02-14 NOTE — Telephone Encounter (Signed)
Olmesartan-Amlodipine-HCTZ and K-Dur refill Last OV: 05/23/17 (annual) Last Refill: 02/01/17 ; #90, with RF x 3 on both, above meds Pharmacy: [place]  Above meds refilled per protocol.

## 2018-02-18 ENCOUNTER — Encounter: Payer: Self-pay | Admitting: Internal Medicine

## 2018-02-19 MED ORDER — POTASSIUM CHLORIDE ER 10 MEQ PO TBCR: 10.0000 meq | EXTENDED_RELEASE_TABLET | Freq: Every day | ORAL | 0 refills | Status: DC

## 2018-02-19 MED ORDER — OLMESARTAN-AMLODIPINE-HCTZ 40-5-12.5 MG PO TABS: 1.0000 | ORAL_TABLET | Freq: Every day | ORAL | 0 refills | Status: DC

## 2018-05-02 ENCOUNTER — Other Ambulatory Visit: Payer: Self-pay | Admitting: Gynecology

## 2018-05-02 DIAGNOSIS — Z1231 Encounter for screening mammogram for malignant neoplasm of breast: Secondary | ICD-10-CM

## 2018-05-27 ENCOUNTER — Ambulatory Visit (INDEPENDENT_AMBULATORY_CARE_PROVIDER_SITE_OTHER): Payer: 59 | Admitting: Internal Medicine

## 2018-05-27 ENCOUNTER — Other Ambulatory Visit (INDEPENDENT_AMBULATORY_CARE_PROVIDER_SITE_OTHER): Payer: 59

## 2018-05-27 ENCOUNTER — Encounter: Payer: Self-pay | Admitting: Internal Medicine

## 2018-05-27 ENCOUNTER — Other Ambulatory Visit: Payer: Self-pay | Admitting: Internal Medicine

## 2018-05-27 VITALS — BP 126/84 | HR 62 | Temp 98.5°F | Ht 65.0 in | Wt 222.0 lb

## 2018-05-27 DIAGNOSIS — Z0001 Encounter for general adult medical examination with abnormal findings: Secondary | ICD-10-CM

## 2018-05-27 DIAGNOSIS — E785 Hyperlipidemia, unspecified: Secondary | ICD-10-CM

## 2018-05-27 DIAGNOSIS — M5416 Radiculopathy, lumbar region: Secondary | ICD-10-CM | POA: Diagnosis not present

## 2018-05-27 DIAGNOSIS — I10 Essential (primary) hypertension: Secondary | ICD-10-CM | POA: Diagnosis not present

## 2018-05-27 DIAGNOSIS — R7302 Impaired glucose tolerance (oral): Secondary | ICD-10-CM | POA: Diagnosis not present

## 2018-05-27 DIAGNOSIS — Z114 Encounter for screening for human immunodeficiency virus [HIV]: Secondary | ICD-10-CM

## 2018-05-27 LAB — CBC WITH DIFFERENTIAL/PLATELET
Basophils Absolute: 0 K/uL (ref 0.0–0.1)
Basophils Relative: 0.4 % (ref 0.0–3.0)
Eosinophils Absolute: 0.1 K/uL (ref 0.0–0.7)
Eosinophils Relative: 2 % (ref 0.0–5.0)
HCT: 40.5 % (ref 36.0–46.0)
Hemoglobin: 13.6 g/dL (ref 12.0–15.0)
Lymphocytes Relative: 50.4 % — ABNORMAL HIGH (ref 12.0–46.0)
Lymphs Abs: 2.3 K/uL (ref 0.7–4.0)
MCHC: 33.6 g/dL (ref 30.0–36.0)
MCV: 84.1 fl (ref 78.0–100.0)
Monocytes Absolute: 0.3 K/uL (ref 0.1–1.0)
Monocytes Relative: 7.1 % (ref 3.0–12.0)
Neutro Abs: 1.8 K/uL (ref 1.4–7.7)
Neutrophils Relative %: 40.1 % — ABNORMAL LOW (ref 43.0–77.0)
Platelets: 247 K/uL (ref 150.0–400.0)
RBC: 4.82 Mil/uL (ref 3.87–5.11)
RDW: 13.3 % (ref 11.5–15.5)
WBC: 4.5 K/uL (ref 4.0–10.5)

## 2018-05-27 LAB — BASIC METABOLIC PANEL WITH GFR
BUN: 24 mg/dL — ABNORMAL HIGH (ref 6–23)
CO2: 30 meq/L (ref 19–32)
Calcium: 9.6 mg/dL (ref 8.4–10.5)
Chloride: 102 meq/L (ref 96–112)
Creatinine, Ser: 1.03 mg/dL (ref 0.40–1.20)
GFR: 72.53 mL/min (ref >60.00)
Glucose, Bld: 151 mg/dL — ABNORMAL HIGH (ref 70–99)
Potassium: 3.2 meq/L — ABNORMAL LOW (ref 3.5–5.1)
Sodium: 140 meq/L (ref 135–145)

## 2018-05-27 LAB — URINALYSIS, ROUTINE W REFLEX MICROSCOPIC
Bilirubin Urine: NEGATIVE
HGB URINE DIPSTICK: NEGATIVE
Ketones, ur: NEGATIVE
Leukocytes, UA: NEGATIVE
NITRITE: NEGATIVE
RBC / HPF: NONE SEEN (ref 0–?)
Specific Gravity, Urine: 1.025 (ref 1.000–1.030)
Total Protein, Urine: NEGATIVE
Urine Glucose: 500 — AB
Urobilinogen, UA: 0.2 (ref 0.0–1.0)
pH: 5.5 (ref 5.0–8.0)

## 2018-05-27 LAB — HEPATIC FUNCTION PANEL
ALK PHOS: 89 U/L (ref 39–117)
ALT: 30 U/L (ref 0–35)
AST: 20 U/L (ref 0–37)
Albumin: 4.3 g/dL (ref 3.5–5.2)
BILIRUBIN DIRECT: 0.1 mg/dL (ref 0.0–0.3)
BILIRUBIN TOTAL: 0.3 mg/dL (ref 0.2–1.2)
Total Protein: 7.5 g/dL (ref 6.0–8.3)

## 2018-05-27 LAB — HEMOGLOBIN A1C: Hgb A1c MFr Bld: 6.1 % (ref 4.6–6.5)

## 2018-05-27 LAB — LIPID PANEL
Cholesterol: 199 mg/dL (ref 0–200)
HDL: 58.3 mg/dL (ref >39.00)
LDL Cholesterol: 122 mg/dL — ABNORMAL HIGH (ref 0–99)
NonHDL: 141.1
Total CHOL/HDL Ratio: 3
Triglycerides: 97 mg/dL (ref 0.0–149.0)
VLDL: 19.4 mg/dL (ref 0.0–40.0)

## 2018-05-27 LAB — HIV ANTIBODY (ROUTINE TESTING W REFLEX): HIV: NONREACTIVE

## 2018-05-27 LAB — TSH: TSH: 1.79 u[IU]/mL (ref 0.35–4.50)

## 2018-05-27 MED ORDER — ROSUVASTATIN CALCIUM 10 MG PO TABS: 10.0000 mg | ORAL_TABLET | Freq: Every day | ORAL | 3 refills | Status: DC

## 2018-05-27 MED ORDER — GABAPENTIN 100 MG PO CAPS: 100.0000 mg | ORAL_CAPSULE | Freq: Three times a day (TID) | ORAL | 3 refills | Status: DC

## 2018-05-27 MED ORDER — POTASSIUM CHLORIDE ER 10 MEQ PO TBCR: 20.0000 meq | EXTENDED_RELEASE_TABLET | Freq: Every day | ORAL | 3 refills | Status: DC

## 2018-05-27 NOTE — Patient Instructions (Signed)
Please take all new medication as prescribed - the gabapentin 100 mg three times per day  Please continue all other medications as before, and refills have been done if requested.  Please have the pharmacy call with any other refills you may need.  Please continue your efforts at being more active, low cholesterol diet, and weight control.  You are otherwise up to date with prevention measures today.  Please keep your appointments with your specialists as you may have planned  You will be contacted regarding the referral for: MRI, and orthopedic  Please go to the LAB in the Basement (turn left off the elevator) for the tests to be done today  You will be contacted by phone if any changes need to be made immediately.  Otherwise, you will receive a letter about your results with an explanation, but please check with MyChart first.  Please remember to sign up for MyChart if you have not done so, as this will be important to you in the future with finding out test results, communicating by private email, and scheduling acute appointments online when needed.  Please return in 1 year for your yearly visit, or sooner if needed, with Lab testing done 3-5 days before

## 2018-05-27 NOTE — Progress Notes (Signed)
Subjective:    Patient ID: Martha Alvarado, female    DOB: 07/05/1966, 52 y.o.   MRN: [id number]  HPI  Here for wellness and f/u;  Overall doing ok;  Pt denies Chest pain, worsening SOB, DOE, wheezing, orthopnea, PND, worsening LE edema, palpitations, dizziness or syncope.  Pt denies neurological change such as new headache, facial or extremity weakness.  Pt denies polydipsia, polyuria, or low sugar symptoms. Pt states overall good compliance with treatment and medications, good tolerability, and has been trying to follow appropriate diet.  Pt denies worsening depressive symptoms, suicidal ideation or panic. No fever, night sweats, wt loss, loss of appetite, or other constitutional symptoms.  Pt states good ability with ADL's, has low fall risk, home safety reviewed and adequate, no other significant changes in hearing or vision, and not very active with exercise. Wt Readings from Last 3 Encounters:  05/27/18 222 lb (100.7 kg)  01/29/18 218 lb (98.9 kg)  01/21/18 217 lb (98.4 kg)  Pt continues to have recurring LBP without change in severity, bowel or bladder change, fever, wt loss,  But now has worsening LLE pain/numbness/weakness, gait change but no falls, constant, moderate, nothing makes better or worse Past Medical History:  Diagnosis Date  . CHLAMYDIAL INFECTION, HX OF 09/13/2007  . Hyperlipidemia 05/20/2015  . HYPERTENSION 09/13/2007  . Impaired glucose tolerance 07/08/2011  . SYPHILIS 09/13/2007   Past Surgical History:  Procedure Laterality Date  . TUBAL LIGATION  1991    reports that she has never smoked. She has never used smokeless tobacco. She reports that she does not drink alcohol or use drugs. family history includes Diabetes in her mother and sister; Hypertension in her sister and sister. No Known Allergies Current Outpatient Medications on File Prior to Visit  Medication Sig Dispense Refill  . aspirin 81 MG tablet Take 81 mg by mouth daily.      . Olmesartan-Amlodipine-HCTZ  (TRIBENZOR) 40-5-12.5 MG TABS Take 1 tablet by mouth daily. 30 tablet 0   Current Facility-Administered Medications on File Prior to Visit  Medication Dose Route Frequency Provider Last Rate Last Dose  . 0.9 %  sodium chloride infusion  500 mL Intravenous Continuous Gatha Mayer, MD       Review of Systems Constitutional: Negative for other unusual diaphoresis, sweats, appetite or weight changes HENT: Negative for other worsening hearing loss, ear pain, facial swelling, mouth sores or neck stiffness.   Eyes: Negative for other worsening pain, redness or other visual disturbance.  Respiratory: Negative for other stridor or swelling Cardiovascular: Negative for other palpitations or other chest pain  Gastrointestinal: Negative for worsening diarrhea or loose stools, blood in stool, distention or other pain Genitourinary: Negative for hematuria, flank pain or other change in urine volume.  Musculoskeletal: Negative for myalgias or other joint swelling.  Skin: Negative for other color change, or other wound or worsening drainage.  Neurological: Negative for other syncope or numbness. Hematological: Negative for other adenopathy or swelling Psychiatric/Behavioral: Negative for hallucinations, other worsening agitation, SI, self-injury, or new decreased concentration All other system neg per pt    Objective:   Physical Exam BP 126/84   Pulse 62   Temp 98.5 F (36.9 C) (Oral)   Ht 5\' 5"  (1.651 m)   Wt 222 lb (100.7 kg)   SpO2 99%   BMI 36.94 kg/m  VS noted,  Constitutional: Pt is oriented to person, place, and time. Appears well-developed and well-nourished, in no significant distress and comfortable Head: Normocephalic  and atraumatic  Eyes: Conjunctivae and EOM are normal. Pupils are equal, round, and reactive to light Right Ear: External ear normal without discharge Left Ear: External ear normal without discharge Nose: Nose without discharge or deformity Mouth/Throat: Oropharynx  is without other ulcerations and moist  Neck: Normal range of motion. Neck supple. No JVD present. No tracheal deviation present or significant neck LA or mass Cardiovascular: Normal rate, regular rhythm, normal heart sounds and intact distal pulses.   Pulmonary/Chest: WOB normal and breath sounds without rales or wheezing  Abdominal: Soft. Bowel sounds are normal. NT. No HSM  Musculoskeletal: Normal range of motion. Exhibits no edema Lymphadenopathy: Has no other cervical adenopathy.  Neurological: Pt is alert and oriented to person, place, and time. Pt has normal reflexes. No cranial nerve deficit. Motor 4+5 LLE o/w intact, Gait intact Skin: Skin is warm and dry. No rash noted or new ulcerations Psychiatric:  Has normal mood and affect. Behavior is normal without agitation No other exam findings  Lab Results  Component Value Date   HGBA1C 6.1 05/27/2018      Assessment & Plan:

## 2018-05-28 ENCOUNTER — Telehealth: Payer: Self-pay

## 2018-05-28 ENCOUNTER — Ambulatory Visit
Admission: RE | Admit: 2018-05-28 | Discharge: 2018-05-28 | Disposition: A | Discharge status: Home / Self Care | Payer: 59 | Source: Ambulatory Visit | Attending: Gynecology | Admitting: Gynecology

## 2018-05-28 DIAGNOSIS — Z1231 Encounter for screening mammogram for malignant neoplasm of breast: Secondary | ICD-10-CM | POA: Diagnosis not present

## 2018-05-28 NOTE — Telephone Encounter (Signed)
Pt has viewed results via MyChart  

## 2018-05-28 NOTE — Telephone Encounter (Signed)
-----   Message from Biagio Borg, MD sent at 05/27/2018 12:28 PM EDT ----- Left message on MyChart, pt to cont same tx except  The test results show that your current treatment is OK, except the potassium is mild low, and the LDL cholesterol is too high.  Please increase the potassium pills to 2 in the AM, follow a lower cholesterol diet, but also start generic crestor 10 mg per day to help reduce your risk of heart disease and stroke in the future.    Shirron to please inform pt, I will do rx x 2

## 2018-06-01 NOTE — Assessment & Plan Note (Addendum)
Mild to mod worsening x 6 mo, for MRI, gabapentin asd, and ortho referral  In addition to the time spent performing CPE, I spent an additional 25 minutes face to face,in which greater than 50% of this time was spent in counseling and coordination of care for patient's acute illness as documented, including the differential dx, treatment, further evaluation and other management of right lumbar radiculopathy, hyperglycemia, HTN, HLD

## 2018-06-01 NOTE — Assessment & Plan Note (Signed)
stable overall by history and exam, recent data reviewed with pt, and pt to continue medical treatment as before,  to f/u any worsening symptoms or concerns, for f/u LDL 

## 2018-06-01 NOTE — Assessment & Plan Note (Signed)
Lab Results  Component Value Date   HGBA1C 6.1 05/27/2018

## 2018-06-01 NOTE — Assessment & Plan Note (Signed)

## 2018-06-01 NOTE — Assessment & Plan Note (Signed)
stable overall by history and exam, recent data reviewed with pt, and pt to continue medical treatment as before,  to f/u any worsening symptoms or concerns BP Readings from Last 3 Encounters:  05/27/18 126/84  01/29/18 124/78  01/21/18 132/68

## 2018-06-11 ENCOUNTER — Ambulatory Visit
Admission: RE | Admit: 2018-06-11 | Discharge: 2018-06-11 | Disposition: A | Discharge status: Home / Self Care | Payer: 59 | Source: Ambulatory Visit | Attending: Internal Medicine | Admitting: Internal Medicine

## 2018-06-11 DIAGNOSIS — M5117 Intervertebral disc disorders with radiculopathy, lumbosacral region: Secondary | ICD-10-CM | POA: Diagnosis not present

## 2018-06-11 DIAGNOSIS — M5416 Radiculopathy, lumbar region: Secondary | ICD-10-CM

## 2018-06-30 ENCOUNTER — Encounter (INDEPENDENT_AMBULATORY_CARE_PROVIDER_SITE_OTHER): Payer: Self-pay | Admitting: Orthopaedic Surgery

## 2018-06-30 ENCOUNTER — Ambulatory Visit (INDEPENDENT_AMBULATORY_CARE_PROVIDER_SITE_OTHER): Payer: 59 | Admitting: Orthopaedic Surgery

## 2018-06-30 VITALS — BP 129/86 | HR 67 | Ht 65.0 in | Wt 225.0 lb

## 2018-06-30 DIAGNOSIS — M545 Low back pain: Secondary | ICD-10-CM | POA: Diagnosis not present

## 2018-06-30 DIAGNOSIS — G8929 Other chronic pain: Secondary | ICD-10-CM | POA: Diagnosis not present

## 2018-06-30 NOTE — Progress Notes (Signed)
Office Visit Note   Patient: Martha Alvarado           Date of Birth: [birth year]           MRN: [id number] Visit Date: 06/30/2018              Requested by: Biagio Borg, MD [address] [place], [place] [id number] PCP: Biagio Borg, MD   Assessment & Plan: Visit Diagnoses:  1. Chronic midline low back pain, with sciatica presence unspecified     Plan: MRI scan demonstrates disc protrusion at several levels of lumbar spine towards the right.  I believe this is causing some nerve root irritation.  Has been on gabapentin.  We will continue with this medicine.  Will schedule epidural steroid injection and monitor response.  Office 1 month  Follow-Up Instructions: Return in about 1 month (around 07/31/2018).   Orders:  Orders Placed This Encounter  Procedures  . Ambulatory referral to Physical Medicine Rehab   No orders of the defined types were placed in this encounter.     Procedures: No procedures performed   Clinical Data: No additional findings.   Subjective: Chief Complaint  Patient presents with  . New Patient (Initial Visit)    LOW BACK PAIN SINCE JAN 2019, NO INJURY  Martha Alvarado experienced insidious onset of low back pain around the first of the year.  She denies any injury or trauma.  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  She does not experience any left lower extremity pain but certainly has had some pain when she is on her feet for a length of time on the right.  She is having as much back pain is leg pain.  No pain with sleeping or sitting.  No bowel or bladder dysfunction.  Pain starts in the back and radiates as far distally as the right ankle. Has been evaluated by her primary care physician with an MRI scan.  I have a copy of the report revealing a small broad-based disc bulge at L3-4 to the right with slight mass-effect upon the right L3 nerve  root.  At L4-5 there was a small broad-based disc bulge with central disc protrusion compressing the ventral aspect of the thecal sac without focal neural impingement.  Slight hypertrophy of the facet joints.  L5-S1 with small focal disc protrusion to the right of the midline compressing the right S1 nerve root.  Has been taking gabapentin without much relief  HPI  Review of Systems  Constitutional: Negative for fatigue and fever.  HENT: Negative for ear pain.   Eyes: Negative for pain.  Respiratory: Negative for cough and shortness of breath.   Cardiovascular: Negative for leg swelling.  Gastrointestinal: Negative for constipation and diarrhea.  Genitourinary: Negative for difficulty urinating.  Musculoskeletal: Positive for back pain. Negative for neck pain.  Skin: Negative for rash.  Allergic/Immunologic: Negative for food allergies.  Neurological: Negative for weakness and numbness.  Hematological: Does not bruise/bleed easily.  Psychiatric/Behavioral: Negative for sleep disturbance.     Objective: Vital Signs: BP 129/86 (BP Location: Left Arm, Patient Position: Sitting, Cuff Size: Normal)   Pulse 67   Ht 5\' 5"  (1.651 m)   Wt 225 lb (102.1 kg)   BMI 37.44 kg/m   Physical Exam  Constitutional: She is oriented to person, place, and time.  She appears well-developed and well-nourished.  HENT:  Mouth/Throat: Oropharynx is clear and moist.  Eyes: Pupils are equal, round, and reactive to light. EOM are normal.  Pulmonary/Chest: Effort normal.  Neurological: She is alert and oriented to person, place, and time.  Skin: Skin is warm and dry.  Psychiatric: She has a normal mood and affect. Her behavior is normal.    Ortho Exam awake alert and oriented x3.  Comfortable position sitting.  Painless range of motion both hips and both knees.  Neurologic exam intact.  Motor exam intact.  No distal edema.  Straight leg raise minimally positive on the right and about 90 degrees.  Negative  on the left.  Discomfort to left leg.  Mild discomfort at the lumbar spine to percussion distally pain over the sacroiliac joints.  Walks without a limp. Specialty Comments:  No specialty comments available.  Imaging: No results found.   PMFS History: Patient Active Problem List   Diagnosis Date Noted  . Right lumbar radiculopathy 05/27/2018  . Acute right-sided low back pain with right-sided sciatica 01/22/2018  . Hyperlipidemia 05/20/2015  . 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 Early menopause 05/01/2012  . Encounter for well adult exam with abnormal findings 07/09/2011  . Impaired glucose tolerance 07/08/2011  . SYPHILIS 09/13/2007  . OBESITY NOS 09/13/2007  . Essential hypertension 09/13/2007  . CHLAMYDIAL INFECTION, HX OF 09/13/2007   Past Medical History:  Diagnosis Date  . CHLAMYDIAL INFECTION, HX OF 09/13/2007  . Hyperlipidemia 05/20/2015  . HYPERTENSION 09/13/2007  . Impaired glucose tolerance 07/08/2011  . SYPHILIS 09/13/2007    Family History  Problem Relation Age of Onset  . Diabetes Mother   . Diabetes Sister   . Hypertension Sister   . Hypertension Sister   . Colon cancer Neg Hx   . Esophageal cancer Neg Hx   . Rectal cancer Neg Hx   . Stomach cancer Neg Hx     Past Surgical History:  Procedure Laterality Date  . TUBAL LIGATION  1991   Social History   Occupational History  . Not on file  Tobacco Use  . Smoking status: Never Smoker  . Smokeless tobacco: Never Used  Substance and Sexual Activity  . Alcohol use: No  . Drug use: No  . Sexual activity: Yes    Birth control/protection: Post-menopausal, Surgical    Comment: 1st intercourse 52 yo-Fewer than 5 partners-BTL

## 2018-07-08 ENCOUNTER — Other Ambulatory Visit (INDEPENDENT_AMBULATORY_CARE_PROVIDER_SITE_OTHER): Payer: Self-pay | Admitting: Orthopaedic Surgery

## 2018-07-08 DIAGNOSIS — G8929 Other chronic pain: Secondary | ICD-10-CM

## 2018-07-08 DIAGNOSIS — M545 Low back pain: Principal | ICD-10-CM

## 2018-07-16 ENCOUNTER — Ambulatory Visit
Admission: RE | Admit: 2018-07-16 | Discharge: 2018-07-16 | Disposition: A | Discharge status: Home / Self Care | Payer: 59 | Source: Ambulatory Visit | Attending: Orthopaedic Surgery | Admitting: Orthopaedic Surgery

## 2018-07-16 DIAGNOSIS — M545 Low back pain: Principal | ICD-10-CM

## 2018-07-16 DIAGNOSIS — M47817 Spondylosis without myelopathy or radiculopathy, lumbosacral region: Secondary | ICD-10-CM | POA: Diagnosis not present

## 2018-07-16 DIAGNOSIS — G8929 Other chronic pain: Secondary | ICD-10-CM

## 2018-07-16 MED ORDER — METHYLPREDNISOLONE ACETATE 40 MG/ML INJ SUSP (RADIOLOG
120.0000 mg | Freq: Once | INTRAMUSCULAR | Status: AC
  Administered 2018-07-16: 120 mg via EPIDURAL

## 2018-07-16 MED ORDER — IOPAMIDOL (ISOVUE-M 200) INJECTION 41%
1.0000 mL | Freq: Once | INTRAMUSCULAR | Status: AC
  Administered 2018-07-16: 1 mL via EPIDURAL

## 2018-07-16 NOTE — Discharge Instructions (Signed)

## 2018-07-24 ENCOUNTER — Telehealth: Payer: Self-pay

## 2018-07-24 NOTE — Telephone Encounter (Signed)
Copied from [place] [phone]. Topic: General - Other >> Jul 24, 2018  1:48 PM Carolyn Stare wrote:  [facility] would like to know if they can dispense a 90 day supply 270 pills since they are a mail order   GABAPENTIN  [phone]

## 2018-07-25 MED ORDER — GABAPENTIN 100 MG PO CAPS: 100.0000 mg | ORAL_CAPSULE | Freq: Three times a day (TID) | ORAL | 1 refills | Status: DC

## 2018-07-25 NOTE — Addendum Note (Signed)
Addended by: Biagio Borg on: 07/25/2018 07:52 AM   Modules accepted: Orders

## 2018-07-25 NOTE — Telephone Encounter (Signed)
Done erx 

## 2019-02-16 ENCOUNTER — Other Ambulatory Visit: Payer: Self-pay | Admitting: Internal Medicine

## 2019-03-26 ENCOUNTER — Other Ambulatory Visit: Payer: Self-pay | Admitting: Internal Medicine

## 2019-06-02 ENCOUNTER — Ambulatory Visit (INDEPENDENT_AMBULATORY_CARE_PROVIDER_SITE_OTHER): Payer: 59 | Admitting: Internal Medicine

## 2019-06-02 ENCOUNTER — Encounter: Payer: Self-pay | Admitting: Internal Medicine

## 2019-06-02 DIAGNOSIS — I10 Essential (primary) hypertension: Secondary | ICD-10-CM | POA: Diagnosis not present

## 2019-06-02 DIAGNOSIS — E559 Vitamin D deficiency, unspecified: Secondary | ICD-10-CM | POA: Diagnosis not present

## 2019-06-02 DIAGNOSIS — R7302 Impaired glucose tolerance (oral): Secondary | ICD-10-CM

## 2019-06-02 DIAGNOSIS — E785 Hyperlipidemia, unspecified: Secondary | ICD-10-CM

## 2019-06-02 DIAGNOSIS — E538 Deficiency of other specified B group vitamins: Secondary | ICD-10-CM

## 2019-06-02 DIAGNOSIS — E611 Iron deficiency: Secondary | ICD-10-CM | POA: Diagnosis not present

## 2019-06-02 DIAGNOSIS — Z0000 Encounter for general adult medical examination without abnormal findings: Secondary | ICD-10-CM | POA: Diagnosis not present

## 2019-06-02 MED ORDER — ROSUVASTATIN CALCIUM 10 MG PO TABS: 10.0000 mg | ORAL_TABLET | Freq: Every day | ORAL | 3 refills | Status: DC

## 2019-06-02 MED ORDER — POTASSIUM CHLORIDE ER 10 MEQ PO TBCR: 20.0000 meq | EXTENDED_RELEASE_TABLET | Freq: Every day | ORAL | 3 refills | Status: DC

## 2019-06-02 MED ORDER — OLMESARTAN-AMLODIPINE-HCTZ 40-5-12.5 MG PO TABS: 1.0000 | ORAL_TABLET | Freq: Every day | ORAL | 3 refills | Status: DC

## 2019-06-02 NOTE — Assessment & Plan Note (Signed)

## 2019-06-02 NOTE — Progress Notes (Signed)
Patient ID: Martha Alvarado, female   DOB: 11/29/1966, 53 y.o.   MRN: [id number]  Virtual Visit via Video Note  I connected with Martha Alvarado on 06/02/19 at 10:20 AM EDT by a video enabled telemedicine application and verified that I am speaking with the correct person using two identifiers.  Location: Patient: at home Provider: at office   I discussed the limitations of evaluation and management by telemedicine and the availability of in person appointments. The patient expressed understanding and agreed to proceed.  History of Present Illness: Here for wellness and f/u;  Overall doing ok;  Pt denies Chest pain, worsening SOB, DOE, wheezing, orthopnea, PND, worsening LE edema, palpitations, dizziness or syncope.  Pt denies neurological change such as new headache, facial or extremity weakness.  Pt denies polydipsia, polyuria, or low sugar symptoms. Pt states overall good compliance with treatment and medications, good tolerability, and has been trying to follow appropriate diet.  Pt denies worsening depressive symptoms, suicidal ideation or panic. No fever, night sweats, wt loss, loss of appetite, or other constitutional symptoms.  Pt states good ability with ADL's, has low fall risk, home safety reviewed and adequate, no other significant changes in hearing or vision, and only occasionally active with exercise.  States she can only take gabapentin bid due to sleepiness with tid.  Pain tolerable. No new complaints Past Medical History:  Diagnosis Date  . CHLAMYDIAL INFECTION, HX OF 09/13/2007  . Hyperlipidemia 05/20/2015  . HYPERTENSION 09/13/2007  . Impaired glucose tolerance 07/08/2011  . SYPHILIS 09/13/2007   Past Surgical History:  Procedure Laterality Date  . TUBAL LIGATION  1991    reports that she has never smoked. She has never used smokeless tobacco. She reports that she does not drink alcohol or use drugs. family history includes Diabetes in her mother and sister; Hypertension in  her sister and sister. No Known Allergies Current Outpatient Medications on File Prior to Visit  Medication Sig Dispense Refill  . aspirin 81 MG tablet Take 81 mg by mouth daily.      Marland Kitchen gabapentin (NEURONTIN) 100 MG capsule TAKE 1 CAPSULE BY MOUTH THREE TIMES A DAY 270 capsule 1   No current facility-administered medications on file prior to visit.    Observations/Objective: Alert, NAD, appropriate mood and affect, resps normal, cn 2-12 intact, moves all 4s, no visible rash or swelling Lab Results  Component Value Date   WBC 4.5 05/27/2018   HGB 13.6 05/27/2018   HCT 40.5 05/27/2018   PLT 247.0 05/27/2018   GLUCOSE 151 (H) 05/27/2018   CHOL 199 05/27/2018   TRIG 97.0 05/27/2018   HDL 58.30 05/27/2018   LDLCALC 122 (H) 05/27/2018   ALT 30 05/27/2018   AST 20 05/27/2018   NA 140 05/27/2018   K 3.2 (L) 05/27/2018   CL 102 05/27/2018   CREATININE 1.03 05/27/2018   BUN 24 (H) 05/27/2018   CO2 30 05/27/2018   TSH 1.79 05/27/2018   HGBA1C 6.1 05/27/2018   Assessment and Plan: See notes  Follow Up Instructions: See notes   I discussed the assessment and treatment plan with the patient. The patient was provided an opportunity to ask questions and all were answered. The patient agreed with the plan and demonstrated an understanding of the instructions.   The patient was advised to call back or seek an in-person evaluation if the symptoms worsen or if the condition fails to improve as anticipated.   [name], MD

## 2019-06-02 NOTE — Patient Instructions (Signed)
Please continue all other medications as before, and refills have been done if requested.  Please have the pharmacy call with any other refills you may need.  Please continue your efforts at being more active, low cholesterol diet, and weight control.  You are otherwise up to date with prevention measures today.  Please keep your appointments with your specialists as you may have planned  Please go to the LAB in the Basement (turn left off the elevator) for the tests to be done on Thursday this week as discussed  You will be contacted by phone if any changes need to be made immediately.  Otherwise, you will receive a letter about your results with an explanation, but please check with MyChart first.  Please remember to sign up for MyChart if you have not done so, as this will be important to you in the future with finding out test results, communicating by private email, and scheduling acute appointments online when needed.  Please return in 1 year for your yearly visit, or sooner if needed, with Lab testing done 3-5 days before

## 2019-06-02 NOTE — Assessment & Plan Note (Signed)
stable overall by history and exam, recent data reviewed with pt, and pt to continue medical treatment as before,  to f/u any worsening symptoms or concerns  

## 2019-06-04 ENCOUNTER — Other Ambulatory Visit: Payer: Self-pay | Admitting: Internal Medicine

## 2019-06-04 ENCOUNTER — Other Ambulatory Visit (INDEPENDENT_AMBULATORY_CARE_PROVIDER_SITE_OTHER): Payer: 59

## 2019-06-04 ENCOUNTER — Telehealth: Payer: Self-pay

## 2019-06-04 DIAGNOSIS — E538 Deficiency of other specified B group vitamins: Secondary | ICD-10-CM | POA: Diagnosis not present

## 2019-06-04 DIAGNOSIS — R7302 Impaired glucose tolerance (oral): Secondary | ICD-10-CM | POA: Diagnosis not present

## 2019-06-04 DIAGNOSIS — E559 Vitamin D deficiency, unspecified: Secondary | ICD-10-CM

## 2019-06-04 DIAGNOSIS — E611 Iron deficiency: Secondary | ICD-10-CM | POA: Diagnosis not present

## 2019-06-04 DIAGNOSIS — Z0000 Encounter for general adult medical examination without abnormal findings: Secondary | ICD-10-CM | POA: Diagnosis not present

## 2019-06-04 LAB — IBC PANEL
Iron: 90 ug/dL (ref 42–145)
Saturation Ratios: 31.7 % (ref 20.0–50.0)
Transferrin: 203 mg/dL — ABNORMAL LOW (ref 212.0–360.0)

## 2019-06-04 LAB — CBC WITH DIFFERENTIAL/PLATELET
Basophils Absolute: 0 10*3/uL (ref 0.0–0.1)
Basophils Relative: 0.2 % (ref 0.0–3.0)
Eosinophils Absolute: 0.1 10*3/uL (ref 0.0–0.7)
Eosinophils Relative: 1.3 % (ref 0.0–5.0)
HCT: 39.7 % (ref 36.0–46.0)
Hemoglobin: 13.3 g/dL (ref 12.0–15.0)
Lymphocytes Relative: 53.5 % — ABNORMAL HIGH (ref 12.0–46.0)
Lymphs Abs: 2.7 10*3/uL (ref 0.7–4.0)
MCHC: 33.5 g/dL (ref 30.0–36.0)
MCV: 84.5 fl (ref 78.0–100.0)
Monocytes Absolute: 0.4 10*3/uL (ref 0.1–1.0)
Monocytes Relative: 7.8 % (ref 3.0–12.0)
Neutro Abs: 1.9 10*3/uL (ref 1.4–7.7)
Neutrophils Relative %: 37.2 % — ABNORMAL LOW (ref 43.0–77.0)
Platelets: 248 10*3/uL (ref 150.0–400.0)
RBC: 4.69 Mil/uL (ref 3.87–5.11)
RDW: 13.7 % (ref 11.5–15.5)
WBC: 5 10*3/uL (ref 4.0–10.5)

## 2019-06-04 LAB — LIPID PANEL
Cholesterol: 167 mg/dL (ref 0–200)
HDL: 53.4 mg/dL (ref >39.00)
LDL Cholesterol: 100 mg/dL — ABNORMAL HIGH (ref 0–99)
NonHDL: 113.64
Total CHOL/HDL Ratio: 3
Triglycerides: 67 mg/dL (ref 0.0–149.0)
VLDL: 13.4 mg/dL (ref 0.0–40.0)

## 2019-06-04 LAB — BASIC METABOLIC PANEL
BUN: 23 mg/dL (ref 6–23)
CO2: 27 mEq/L (ref 19–32)
Calcium: 9.3 mg/dL (ref 8.4–10.5)
Chloride: 101 mEq/L (ref 96–112)
Creatinine, Ser: 1.09 mg/dL (ref 0.40–1.20)
GFR: 63.67 mL/min (ref >60.00)
Glucose, Bld: 113 mg/dL — ABNORMAL HIGH (ref 70–99)
Potassium: 3.7 mEq/L (ref 3.5–5.1)
Sodium: 141 mEq/L (ref 135–145)

## 2019-06-04 LAB — HEPATIC FUNCTION PANEL
ALT: 35 U/L (ref 0–35)
AST: 21 U/L (ref 0–37)
Albumin: 4.1 g/dL (ref 3.5–5.2)
Alkaline Phosphatase: 95 U/L (ref 39–117)
Bilirubin, Direct: 0.1 mg/dL (ref 0.0–0.3)
Total Bilirubin: 0.4 mg/dL (ref 0.2–1.2)
Total Protein: 6.9 g/dL (ref 6.0–8.3)

## 2019-06-04 LAB — HEMOGLOBIN A1C: Hgb A1c MFr Bld: 6.5 % (ref 4.6–6.5)

## 2019-06-04 LAB — TSH: TSH: 4.02 u[IU]/mL (ref 0.35–4.50)

## 2019-06-04 LAB — VITAMIN D 25 HYDROXY (VIT D DEFICIENCY, FRACTURES): VITD: 13.19 ng/mL — ABNORMAL LOW (ref 30.00–100.00)

## 2019-06-04 LAB — VITAMIN B12: Vitamin B-12: 366 pg/mL (ref 211–911)

## 2019-06-04 MED ORDER — VITAMIN D (ERGOCALCIFEROL) 1.25 MG (50000 UNIT) PO CAPS: 50000.0000 [IU] | ORAL_CAPSULE | ORAL | 0 refills | Status: DC

## 2019-06-04 NOTE — Telephone Encounter (Signed)
-----   Message from Biagio Borg, MD sent at 06/04/2019  3:04 PM EDT ----- Left message on MyChart, pt to cont same tx except  The test results show that your current treatment is OK, as the tests are stable, except the Vitamin D level is quite low.  Please take Vitamin D 50000 units weekly for 12 weeks, then plan to change to OTC Vitamin D3 at 2000 units per day forever.    Shirron to please inform pt, I will do rx

## 2019-06-04 NOTE — Telephone Encounter (Signed)
Pt has viewed results via MyChart  

## 2019-06-20 ENCOUNTER — Encounter (HOSPITAL_COMMUNITY): Payer: Self-pay

## 2019-06-20 ENCOUNTER — Emergency Department (HOSPITAL_COMMUNITY)
Admission: EM | Admit: 2019-06-20 | Discharge: 2019-06-20 | Disposition: A | Discharge status: Home / Self Care | Payer: 59 | Attending: Emergency Medicine | Admitting: Emergency Medicine

## 2019-06-20 ENCOUNTER — Other Ambulatory Visit: Payer: Self-pay

## 2019-06-20 DIAGNOSIS — Z7982 Long term (current) use of aspirin: Secondary | ICD-10-CM | POA: Diagnosis not present

## 2019-06-20 DIAGNOSIS — Z79899 Other long term (current) drug therapy: Secondary | ICD-10-CM | POA: Insufficient documentation

## 2019-06-20 DIAGNOSIS — I10 Essential (primary) hypertension: Secondary | ICD-10-CM | POA: Insufficient documentation

## 2019-06-20 DIAGNOSIS — M5441 Lumbago with sciatica, right side: Secondary | ICD-10-CM | POA: Insufficient documentation

## 2019-06-20 DIAGNOSIS — M545 Low back pain: Secondary | ICD-10-CM | POA: Diagnosis present

## 2019-06-20 MED ORDER — LIDOCAINE 5 % EX PTCH: 1.0000 | MEDICATED_PATCH | CUTANEOUS | 0 refills | Status: DC

## 2019-06-20 MED ORDER — PREDNISONE 50 MG PO TABS: ORAL_TABLET | ORAL | 0 refills | Status: DC

## 2019-06-20 MED ORDER — OXYCODONE-ACETAMINOPHEN 5-325 MG PO TABS
1.0000 | ORAL_TABLET | Freq: Once | ORAL | Status: AC
  Administered 2019-06-20: 1 via ORAL
  Filled 2019-06-20: qty 1

## 2019-06-20 MED ORDER — METHOCARBAMOL 500 MG PO TABS: 500.0000 mg | ORAL_TABLET | Freq: Three times a day (TID) | ORAL | 0 refills | Status: DC | PRN

## 2019-06-20 NOTE — Discharge Instructions (Signed)
You were seen in the emergency department today for back pain.  We are sending you home with the following medicines: -Prednisone: This is a steroid, take this daily in the morning for the next 5 days.

## 2019-06-20 NOTE — ED Triage Notes (Signed)
She c/o non-traumatic low back pain radiating into right hip ?sciatica? X 1 week. She is ambulatory and in no distress.

## 2019-06-20 NOTE — ED Provider Notes (Signed)
[facility] DEPT Provider Note   CSN: [id number] Arrival date & time: 06/20/19  1314     History   Chief Complaint Chief Complaint  Patient presents with  . Back Pain    HPI Martha Alvarado is a 53 y.o. female with a hx of hyperlipidemia, hypertension, as well as prior R sided low back pain w/ radiculopathy who presents to the ED with complaints of back pain for the past 1 week.  Patient states the pain is located to the lower back bilaterally, right side worse than left side.  The pain intermittently radiates down the right lower extremity.  She states her pain is constant, severe in nature, worse with movement, no alleviating factors.  Her PCP placed her on gabapentin and told to take Advil which she has been doing without relief.  She states this feels similar to when she has had issues with sciatica on the right side, she thinks this may have improved with steroids but is not entirely sure.  She denies recent traumatic injury or change in activity.  Denies numbness, tingling, weakness, saddle anesthesia, incontinence to bowel/bladder, fever, chills, IV drug use, dysuria, or hx of cancer. Patient has not had prior back surgeries.       HPI  Past Medical History:  Diagnosis Date  . CHLAMYDIAL INFECTION, HX OF 09/13/2007  . Hyperlipidemia 05/20/2015  . HYPERTENSION 09/13/2007  . Impaired glucose tolerance 07/08/2011  . SYPHILIS 09/13/2007    Patient Active Problem List   Diagnosis Date Noted  . Right lumbar radiculopathy 05/27/2018  . Acute right-sided low back pain with right-sided sciatica 01/22/2018  . Hyperlipidemia 05/20/2015  . 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 Early menopause 05/01/2012  . Preventative health care 07/09/2011  . Impaired glucose tolerance 07/08/2011  . SYPHILIS 09/13/2007  . OBESITY NOS 09/13/2007  . Essential hypertension 09/13/2007  . CHLAMYDIAL INFECTION, HX OF 09/13/2007    Past Surgical History:  Procedure Laterality  Date  . TUBAL LIGATION  1991     OB History    Gravida  2   Para  2   Term  2   Preterm      AB      Living  2     SAB      TAB      Ectopic      Multiple      Live Births  2            Home Medications    Prior to Admission medications   Medication Sig Start Date End Date Taking? Authorizing Provider  aspirin 81 MG tablet Take 81 mg by mouth daily.      [provider]  gabapentin (NEURONTIN) 100 MG capsule TAKE 1 CAPSULE BY MOUTH THREE TIMES A DAY 03/26/19   Biagio Borg, MD  Olmesartan-amLODIPine-HCTZ 40-5-12.5 MG TABS Take 1 tablet by mouth daily. 06/02/19   Biagio Borg, MD  potassium chloride (K-DUR) 10 MEQ tablet Take 2 tablets (20 mEq total) by mouth daily. 06/02/19   Biagio Borg, MD  rosuvastatin (CRESTOR) 10 MG tablet Take 1 tablet (10 mg total) by mouth daily. 06/02/19   Biagio Borg, MD  Vitamin D, Ergocalciferol, (DRISDOL) 1.25 MG (50000 UT) CAPS capsule Take 1 capsule (50,000 Units total) by mouth every 7 (seven) days. 06/04/19   Biagio Borg, MD    Family History Family History  Problem Relation Age of Onset  . Diabetes Mother   .  Diabetes Sister   . Hypertension Sister   . Hypertension Sister   . Colon cancer Neg Hx   . Esophageal cancer Neg Hx   . Rectal cancer Neg Hx   . Stomach cancer Neg Hx     Social History Social History   Tobacco Use  . Smoking status: Never Smoker  . Smokeless tobacco: Never Used  Substance Use Topics  . Alcohol use: No  . Drug use: No     Allergies   Patient has no known allergies.   Review of Systems Review of Systems  Constitutional: Negative for chills and fever.  Gastrointestinal: Negative for abdominal pain and vomiting.  Genitourinary: Negative for dysuria.  Musculoskeletal: Positive for back pain.  Neurological: Negative for weakness and numbness.       Negative for incontinence or saddle anesthesia.  All other systems reviewed and are negative.   Physical Exam Updated Vital  Signs BP 101/81 (BP Location: Right Arm)   Pulse 100   Temp 99.8 F (37.7 C) (Oral)   Resp 18   SpO2 90%   Physical Exam Vitals signs and nursing note reviewed.  Constitutional:      General: She is not in acute distress.    Appearance: She is well-developed. She is not ill-appearing or toxic-appearing.  HENT:     Head: Normocephalic and atraumatic.  Neck:     Musculoskeletal: Normal range of motion and neck supple. No spinous process tenderness or muscular tenderness.  Cardiovascular:     Rate and Rhythm: Normal rate and regular rhythm.     Pulses:          Dorsalis pedis pulses are 2+ on the right side and 2+ on the left side.       Posterior tibial pulses are 2+ on the right side and 2+ on the left side.     Comments: 2+ symmetric DP/PT pulses bilaterally. Pulmonary:     Effort: Pulmonary effort is normal.     Breath sounds: Normal breath sounds.  Abdominal:     General: There is no distension.     Tenderness: There is no abdominal tenderness. There is no right CVA tenderness, left CVA tenderness, guarding or rebound.  Musculoskeletal:     Comments: No obvious deformity, appreciable swelling, erythema, ecchymosis, significant open wounds, or increased warmth.  Extremities: Normal ROM. Nontender.  Back: Patient has bilateral lumbar & upper sacral paraspinal muscle tenderness as well as diffuse midline tenderness without point/focal tenderness or palpable step-off.  Her right-sided paraspinal muscle tenderness is more significant than the left.    Skin:    General: Skin is warm and dry.     Capillary Refill: Capillary refill takes less than 2 seconds.     Findings: No rash.  Neurological:     Mental Status: She is alert.     Deep Tendon Reflexes:     Reflex Scores:      Patellar reflexes are 2+ on the right side and 2+ on the left side.    Comments: Sensation grossly intact to bilateral lower extremities. 5/5 symmetric strength with plantar/dorsiflexion bilaterally. Gait is  antalgic, no obvious foot drop.   Psychiatric:        Mood and Affect: Mood normal.        Behavior: Behavior normal.    ED Treatments / Results  Labs (all labs ordered are listed, but only abnormal results are displayed) Labs Reviewed - No data to display  EKG    Radiology No  results found.  Procedures Procedures (including critical care time)  Medications Ordered in ED Medications  oxyCODONE-acetaminophen (PERCOCET/ROXICET) 5-325 MG per tablet 1 tablet (1 tablet Oral Given 06/20/19 1554)     Prior results reviewed:  MRI L spine wo contrast. 06/11/18 IMPRESSION: 1. Small focal soft disc protrusion at L5-S1 to the right compressing the right S1 nerve. 2. Small central disc protrusion and broad-based disc bulge at L4-5 with slight compression of the thecal sac without focal neural impingement. 3. Right foraminal and extraforaminal small disc protrusion at L3-4 with a slight mass effect upon the right L3 nerve lateral to the neural foramen.   Electronically Signed   By: Lorriane Shire M.D.   On: 06/11/2018 13:53  Initial Impression / Assessment and Plan / ED Course  I have reviewed the triage vital signs and the nursing notes.  Pertinent labs & imaging results that were available during my care of the patient were reviewed by me and considered in my medical decision making (see chart for details).   Patient presents the emergency department with complaints of lower back pain radiating intermittently to the right lower extremity.  Patient is nontoxic-appearing, in no apparent distress, vitals without significant abnormality.  On exam she has diffuse lumbar & upper sacral tenderness that is worse on the R side.  No focal neurologic deficits, no saddle anesthesia, no incontinence, do not suspect cauda equina.  No fever or history of IV drug use do not suspect spinal abscess.  She has no abdominal tenderness or CVA tenderness, no urinary symptoms, doubt UTI or kidney stone.   Her presentation does not seem consistent with dissection.  Suspect musculoskeletal with degree of sciatica.  She has had prior back imaging within the past 1 year and states this feels the same. MRI reviewed as above. No neuro deficits to suggest cord compression. Will place patient on steroids, muscle relaxant & lidoderm patches- discussed no driving/operating heavy machinery when taking muscle relaxant. PCP follow up. I discussed treatment plan, need for follow-up, and return precautions with the patient. Provided opportunity for questions, patient confirmed understanding and is in agreement with plan.      Final Clinical Impressions(s) / ED Diagnoses   Final diagnoses:  Acute bilateral low back pain with right-sided sciatica    ED Discharge Orders         Ordered    predniSONE (DELTASONE) 50 MG tablet     06/20/19 1549    methocarbamol (ROBAXIN) 500 MG tablet  Every 8 hours PRN     06/20/19 1549    lidocaine (LIDODERM) 5 %  Every 24 hours     06/20/19 1549           Maila Dukes, Glynda Jaeger, PA-C 06/20/19 1627    Davonna Belling, MD 06/20/19 2256

## 2019-06-22 ENCOUNTER — Telehealth: Payer: Self-pay

## 2019-06-22 NOTE — Telephone Encounter (Signed)
PCP does not do phone call request. Pt can feel free to schedule a doxy or an in office visit if she would like. An ED follow up is a recommendation from the hospital staff.   Copied from [place] [id number]. Topic: General - Other >> Jun 22, 2019  9:49 AM Rutherford Nail, NT wrote: Reason for CRM: Patient calling and is requesting a call back from Dr Jenny Reichmann regarding her visit with [facility] on Saturday. Advised that ED note wanted her to follow up in 3 days and I could get a someone on the line to help schedule, she satates that she does not think she needs a visit since the ED prescribed her with medication that she needs. Please advise.  CB#: [phone]

## 2019-06-22 NOTE — Telephone Encounter (Signed)
Pt scheduled  

## 2019-06-25 ENCOUNTER — Other Ambulatory Visit: Payer: Self-pay

## 2019-06-25 ENCOUNTER — Encounter: Payer: Self-pay | Admitting: Internal Medicine

## 2019-06-25 ENCOUNTER — Ambulatory Visit (INDEPENDENT_AMBULATORY_CARE_PROVIDER_SITE_OTHER): Payer: 59 | Admitting: Internal Medicine

## 2019-06-25 VITALS — BP 126/84 | HR 77 | Temp 98.4°F | Ht 65.0 in | Wt 218.0 lb

## 2019-06-25 DIAGNOSIS — Z23 Encounter for immunization: Secondary | ICD-10-CM

## 2019-06-25 DIAGNOSIS — M5416 Radiculopathy, lumbar region: Secondary | ICD-10-CM

## 2019-06-25 DIAGNOSIS — I10 Essential (primary) hypertension: Secondary | ICD-10-CM | POA: Diagnosis not present

## 2019-06-25 DIAGNOSIS — R7302 Impaired glucose tolerance (oral): Secondary | ICD-10-CM

## 2019-06-25 MED ORDER — GABAPENTIN 300 MG PO CAPS: 300.0000 mg | ORAL_CAPSULE | Freq: Three times a day (TID) | ORAL | 1 refills | Status: DC

## 2019-06-25 MED ORDER — TRAMADOL HCL 50 MG PO TABS: 50.0000 mg | ORAL_TABLET | Freq: Four times a day (QID) | ORAL | 0 refills | Status: DC | PRN

## 2019-06-25 NOTE — Progress Notes (Signed)
Subjective:    Patient ID: Martha Alvarado, female    DOB: 1966/02/22, 53 y.o.   MRN: [id number]  HPI  Here after seen in ED with back pain, now mod to severe persistent, burning, right sided with radiation to the RLE, but no bowel or bladder change, fever, wt loss,  worsening LE numbness/weakness, gait change or falls  All this despite prednisone, robaxin, lidoderm and heating pad. . Pt denies new neurological symptoms such as new headache, or facial or extremity weakness or numbness   Pt denies fever, wt loss, night sweats, loss of appetite, or other constitutional symptoms  Pt denies chest pain, increased sob or doe, wheezing, orthopnea, PND, increased LE swelling, palpitations, dizziness or syncope.   Pt denies polydipsia, polyuria,.    Past Medical History:  Diagnosis Date  . CHLAMYDIAL INFECTION, HX OF 09/13/2007  . Hyperlipidemia 05/20/2015  . HYPERTENSION 09/13/2007  . Impaired glucose tolerance 07/08/2011  . SYPHILIS 09/13/2007   Past Surgical History:  Procedure Laterality Date  . TUBAL LIGATION  1991    reports that she has never smoked. She has never used smokeless tobacco. She reports that she does not drink alcohol or use drugs. family history includes Diabetes in her mother and sister; Hypertension in her sister and sister. No Known Allergies Current Outpatient Medications on File Prior to Visit  Medication Sig Dispense Refill  . aspirin 81 MG tablet Take 81 mg by mouth daily.      Marland Kitchen lidocaine (LIDODERM) 5 % Place 1 patch onto the skin daily. Place to area of discomfort. Remove & Discard patch within 12 hours of placement. 30 patch 0  . methocarbamol (ROBAXIN) 500 MG tablet Take 1 tablet (500 mg total) by mouth every 8 (eight) hours as needed. 15 tablet 0  . Olmesartan-amLODIPine-HCTZ 40-5-12.5 MG TABS Take 1 tablet by mouth daily. 90 tablet 3  . potassium chloride (K-DUR) 10 MEQ tablet Take 2 tablets (20 mEq total) by mouth daily. 180 tablet 3  . predniSONE (DELTASONE) 50 MG  tablet Please take 1 tablet daily with breakfast for the next 5 days. 5 tablet 0  . rosuvastatin (CRESTOR) 10 MG tablet Take 1 tablet (10 mg total) by mouth daily. 90 tablet 3  . Vitamin D, Ergocalciferol, (DRISDOL) 1.25 MG (50000 UT) CAPS capsule Take 1 capsule (50,000 Units total) by mouth every 7 (seven) days. 12 capsule 0   No current facility-administered medications on file prior to visit.    Review of Systems  Constitutional: Negative for other unusual diaphoresis or sweats HENT: Negative for ear discharge or swelling Eyes: Negative for other worsening visual disturbances Respiratory: Negative for stridor or other swelling  Gastrointestinal: Negative for worsening distension or other blood Genitourinary: Negative for retention or other urinary change Musculoskeletal: Negative for other MSK pain or swelling Skin: Negative for color change or other new lesions Neurological: Negative for worsening tremors and other numbness  Psychiatric/Behavioral: Negative for worsening agitation or other fatigue All other system neg per pt    Objective:   Physical Exam BP 126/84   Pulse 77   Temp 98.4 F (36.9 C) (Oral)   Ht 5\' 5"  (1.651 m)   Wt 218 lb (98.9 kg)   SpO2 97%   BMI 36.28 kg/m  VS noted,  Constitutional: Pt appears in NAD HENT: Head: NCAT.  Right Ear: External ear normal.  Left Ear: External ear normal.  Eyes: . Pupils are equal, round, and reactive to light. Conjunctivae and EOM are normal  Nose: without d/c or deformity Neck: Neck supple. Gross normal ROM Cardiovascular: Normal rate and regular rhythm.   Pulmonary/Chest: Effort normal and breath sounds without rales or wheezing.  Abd:  Soft, NT, ND, + BS, no organomegaly Spine nontender Neurological: Pt is alert. At baseline orientation, motor grossly intact Skin: Skin is warm. No rashes, other new lesions, no LE edema Psychiatric: Pt behavior is normal without agitation  No other exam findings Lab Results  Component  Value Date   WBC 5.0 06/04/2019   HGB 13.3 06/04/2019   HCT 39.7 06/04/2019   PLT 248.0 06/04/2019   GLUCOSE 113 (H) 06/04/2019   CHOL 167 06/04/2019   TRIG 67.0 06/04/2019   HDL 53.40 06/04/2019   LDLCALC 100 (H) 06/04/2019   ALT 35 06/04/2019   AST 21 06/04/2019   NA 141 06/04/2019   K 3.7 06/04/2019   CL 101 06/04/2019   CREATININE 1.09 06/04/2019   BUN 23 06/04/2019   CO2 27 06/04/2019   TSH 4.02 06/04/2019   HGBA1C 6.5 06/04/2019        Assessment & Plan:

## 2019-06-25 NOTE — Patient Instructions (Signed)
You had the Tdap tetanus shot today  Please take all new medication as prescribed - the tramadol as needed for pain, and ok to increase the gabapentin to 300 mg three times per day  You will be contacted regarding the referral for: Dr Durward Fortes  Please continue all other medications as before, including the robaxin, prednisone, and lidoderm patch  Please have the pharmacy call with any other refills you may need.  Please continue your efforts at being more active, low cholesterol diet, and weight control.  Please keep your appointments with your specialists as you may have planned  You are given the work note today

## 2019-06-28 ENCOUNTER — Encounter: Payer: Self-pay | Admitting: Internal Medicine

## 2019-06-28 NOTE — Assessment & Plan Note (Signed)
stable overall by history and exam, recent data reviewed with pt, and pt to continue medical treatment as before,  to f/u any worsening symptoms or concerns  

## 2019-06-28 NOTE — Assessment & Plan Note (Signed)
Mod to severe uncontrolled, for increase gabapentin 300 tid, tramadol prn, refer back to ortho/Dr Durward Fortes

## 2019-06-30 ENCOUNTER — Telehealth: Payer: Self-pay | Admitting: Internal Medicine

## 2019-06-30 NOTE — Telephone Encounter (Signed)
Copied from [place] [id number]. Topic: General - Other >> Jun 25, 2019  1:19 PM Rainey Pines A wrote: Patient stated that FMLA paperwork was faxed over to office today and she called to confirm it was received and can be filled out. Patient would like call back at [phone] >> Jun 30, 2019  9:23 AM Para Skeans A wrote: I have received form via fax. >> Jun 30, 2019  8:35 AM Scherrie Gerlach wrote: Pt following up on FMLA paperwork.  She will have them refax, and I gave her fax number just in case they had the old one. >> Jun 25, 2019  2:37 PM Lonia Skinner [place] A wrote: I have not received anything.   Forms have been completed & placed in providers box to review and sign.

## 2019-07-01 DIAGNOSIS — Z0279 Encounter for issue of other medical certificate: Secondary | ICD-10-CM

## 2019-07-01 NOTE — Telephone Encounter (Signed)
Forms have been signed, Faxed to [facility] @[phone], Copy sent to scan &Charged for.   Patient informed, Original mailed to her.

## 2019-07-27 ENCOUNTER — Other Ambulatory Visit: Payer: Self-pay

## 2019-07-29 ENCOUNTER — Ambulatory Visit (INDEPENDENT_AMBULATORY_CARE_PROVIDER_SITE_OTHER): Payer: 59 | Admitting: Gynecology

## 2019-07-29 ENCOUNTER — Other Ambulatory Visit: Payer: Self-pay

## 2019-07-29 ENCOUNTER — Encounter: Payer: Self-pay | Admitting: Gynecology

## 2019-07-29 VITALS — BP 130/80 | Ht 65.0 in | Wt 223.0 lb

## 2019-07-29 DIAGNOSIS — Z01419 Encounter for gynecological examination (general) (routine) without abnormal findings: Secondary | ICD-10-CM

## 2019-07-29 DIAGNOSIS — N952 Postmenopausal atrophic vaginitis: Secondary | ICD-10-CM

## 2019-07-29 NOTE — Patient Instructions (Signed)
Schedule your mammogram.  Follow-up in 1 year for annual exam. 

## 2019-07-29 NOTE — Progress Notes (Signed)
    Martha Alvarado [birth year] [id number]        53 y.o.  G2P2002 for annual gynecologic exam.  Without gynecologic complaints  Past medical history,surgical history, problem list, medications, allergies, family history and social history were all reviewed and documented as reviewed in the EPIC chart.  ROS:  Performed with pertinent positives and negatives included in the history, assessment and plan.   Additional significant findings : None   Exam: Wandra Scot assistant Vitals:   07/29/19 0757  BP: 130/80  Weight: 223 lb (101.2 kg)  Height: 5\' 5"  (1.651 m)   Body mass index is 37.11 kg/m.  General appearance:  Normal affect, orientation and appearance. Skin: Grossly normal HEENT: Without gross lesions.  No cervical or supraclavicular adenopathy. Thyroid normal.  Lungs:  Clear without wheezing, rales or rhonchi Cardiac: RR, without RMG Abdominal:  Soft, nontender, without masses, guarding, rebound, organomegaly or hernia Breasts:  Examined lying and sitting without masses, retractions, discharge or axillary adenopathy. Pelvic:  Ext, BUS, Vagina: Normal with mild atrophic changes  Cervix: Normal with mild atrophic changes  Uterus: Anteverted, normal size, shape and contour, midline and mobile nontender   Adnexa: Without masses or tenderness    Anus and perineum: Normal   Rectovaginal: Normal sphincter tone without palpated masses or tenderness.    Assessment/Plan:  53 y.o. G10P2002 female for annual gynecologic exam.   1. Postmenopausal.  Doing well without significant menopausal symptoms or any bleeding. 2. Mammography due now and she is in the process of arranging.  Breast exam normal today. 3. Pap smear/HPV 2019.  No Pap smear done today.  No history of abnormal Pap smears.  Plan repeat Pap smear/HPV at 5-year interval per current screening guidelines. 4. Colonoscopy reported 2019.  Repeat at their recommended interval. 5. DEXA never.  Will plan further into the  menopause. 6. Health maintenance.  No routine lab work done as patient does this elsewhere.  Follow-up 1 year, sooner as needed.   Anastasio Auerbach MD, 8:12 AM 07/29/2019

## 2019-08-04 ENCOUNTER — Other Ambulatory Visit: Payer: Self-pay

## 2019-08-04 ENCOUNTER — Ambulatory Visit: Payer: 59 | Admitting: Orthopaedic Surgery

## 2019-08-04 ENCOUNTER — Encounter: Payer: Self-pay | Admitting: Orthopaedic Surgery

## 2019-08-04 VITALS — BP 153/96 | HR 70 | Ht 65.0 in | Wt 223.0 lb

## 2019-08-04 DIAGNOSIS — M5416 Radiculopathy, lumbar region: Secondary | ICD-10-CM | POA: Diagnosis not present

## 2019-08-04 NOTE — Progress Notes (Signed)
Office Visit Note   Patient: Martha Alvarado           Date of Birth: [birth year]           MRN: [id number] Visit Date: 08/04/2019              Requested by: Biagio Borg, MD [place],   [id number] PCP: Biagio Borg, MD   Assessment & Plan: Visit Diagnoses:  1. Right lumbar radiculopathy     Plan: Recurrent low back pain with radiculopathy as far distally as the right thigh.  Did well with an epidural steroid injection last year.  Will schedule for same.  Return in 1 month.  Has been on gabapentin and over-the-counter medicine which she will continue.  Also start physical therapy for back strengthening exercises.  Follow-Up Instructions: Return After epidural steroid injection.   Orders:  Orders Placed This Encounter  Procedures   Epidural Steroid Injection - Lumbar/Sacral (Ancillary Performed)   Ambulatory referral to Physical Therapy   No orders of the defined types were placed in this encounter.     Procedures: No procedures performed   Clinical Data: No additional findings.   Subjective: Chief Complaint  Patient presents with   Lower Back - Pain  Patient presents today for lower back pain. She was here last year for the same problem. She said that her back started hurting again two months ago. Her pain travels down her right leg.5 5 Patient states that she received a ESI in June of 2019 and had good relief. She went to the ED at [facility] two months ago and was given Robaxin and Lidoderm patches. She said that her back feels better with rest. She is a Chartered certified accountant at [employer].  Prior films revealed degenerative disc disease at L5-S1.  Martha Alvarado had an MRI scan of the lumbar spine in June 2019.  She had a small focal soft disc protrusion at L5-S1 to the right compressing the right S1 nerve.  There was a small central disc protrusion and broad-based disc bulging at L4-5 with slight compression of the thecal sac and a right foraminal and  extraforaminal small disc protrusion at L3-4 with a slight mass-effect upon the right L3 nerve lateral to the neural foramen.  I believe this would explain a lot of the pain she is having in her right leg.  No left leg symptoms.  Has had intervals where she was pain-free but has had problems for at least a year and a half and seems to be related to her work as a Chartered certified accountant at [employer]. Has been taking gabapentin through her primary care physician's office.  Denies any bowel or bladder dysfunction  HPI  Review of Systems  Constitutional: Negative for fatigue.  HENT: Negative for ear pain.   Eyes: Negative for pain.  Respiratory: Negative for shortness of breath.   Cardiovascular: Negative for leg swelling.  Gastrointestinal: Negative for constipation and diarrhea.  Endocrine: Negative for cold intolerance and heat intolerance.  Genitourinary: Negative for difficulty urinating.  Musculoskeletal: Negative for joint swelling.  Skin: Negative for rash.  Allergic/Immunologic: Negative for food allergies.  Neurological: Negative for weakness.  Hematological: Does not bruise/bleed easily.  Psychiatric/Behavioral: Negative for sleep disturbance.     Objective: Vital Signs: BP (!) 153/96    Pulse 70    Ht 5\' 5"  (1.651 m)    Wt 223 lb (101.2 kg)    BMI 37.11  kg/m   Physical Exam Constitutional:      Appearance: She is well-developed.  Eyes:     Pupils: Pupils are equal, round, and reactive to light.  Pulmonary:     Effort: Pulmonary effort is normal.  Skin:    General: Skin is warm and dry.  Neurological:     Mental Status: She is alert and oriented to person, place, and time.  Psychiatric:        Behavior: Behavior normal.     Ortho Exam awake alert and oriented x3.  Comfortable sitting.  Straight leg raise minimally positive on the right at 90 degrees for posterior thigh pain.  No left leg pain.  I thought the right knee reflex was slightly decreased compared to the left but  ankle reflexes were symmetrical.  Motor and sensory exam intact.  Painless range of motion of both hips.  No percussible tenderness of the lumbar spine or the sacroiliac joints.  Walks without a limp or ambulatory aid  Specialty Comments:  No specialty comments available.  Imaging: No results found.   PMFS History: Patient Active Problem List   Diagnosis Date Noted   Right lumbar radiculopathy 05/27/2018   Acute right-sided low back pain with right-sided sciatica 01/22/2018   Hyperlipidemia 05/20/2015   Menopause syndrome 05/08/2012   Early menopause 05/01/2012   Preventative health care 07/09/2011   Impaired glucose tolerance 07/08/2011   SYPHILIS 09/13/2007   OBESITY NOS 09/13/2007   Essential hypertension 09/13/2007   CHLAMYDIAL INFECTION, HX OF 09/13/2007   Past Medical History:  Diagnosis Date   CHLAMYDIAL INFECTION, HX OF 09/13/2007   Hyperlipidemia 05/20/2015   HYPERTENSION 09/13/2007   Impaired glucose tolerance 07/08/2011   SYPHILIS 09/13/2007    Family History  Problem Relation Age of Onset   Diabetes Mother    Diabetes Sister    Hypertension Sister    Hypertension Sister    Colon cancer Neg Hx    Esophageal cancer Neg Hx    Rectal cancer Neg Hx    Stomach cancer Neg Hx     Past Surgical History:  Procedure Laterality Date   TUBAL LIGATION  1991   Social History   Occupational History   Not on file  Tobacco Use   Smoking status: Never Smoker   Smokeless tobacco: Never Used  Substance and Sexual Activity   Alcohol use: No   Drug use: No   Sexual activity: Yes    Birth control/protection: Post-menopausal, Surgical    Comment: 1st intercourse 53 yo-Fewer than 5 partners-BTL

## 2019-08-10 ENCOUNTER — Other Ambulatory Visit: Payer: Self-pay

## 2019-08-10 ENCOUNTER — Ambulatory Visit
Admission: RE | Admit: 2019-08-10 | Discharge: 2019-08-10 | Disposition: A | Discharge status: Home / Self Care | Payer: 59 | Source: Ambulatory Visit | Attending: Orthopaedic Surgery | Admitting: Orthopaedic Surgery

## 2019-08-10 DIAGNOSIS — M5416 Radiculopathy, lumbar region: Secondary | ICD-10-CM

## 2019-08-10 MED ORDER — IOPAMIDOL (ISOVUE-M 200) INJECTION 41%
1.0000 mL | Freq: Once | INTRAMUSCULAR | Status: AC
  Administered 2019-08-10: 1 mL via EPIDURAL

## 2019-08-10 MED ORDER — METHYLPREDNISOLONE ACETATE 40 MG/ML INJ SUSP (RADIOLOG
120.0000 mg | Freq: Once | INTRAMUSCULAR | Status: AC
  Administered 2019-08-10: 120 mg via EPIDURAL

## 2019-08-10 NOTE — Discharge Instructions (Signed)

## 2019-08-19 ENCOUNTER — Ambulatory Visit: Payer: 59 | Attending: Orthopaedic Surgery | Admitting: Physical Therapy

## 2019-08-19 ENCOUNTER — Encounter: Payer: Self-pay | Admitting: Physical Therapy

## 2019-08-19 ENCOUNTER — Other Ambulatory Visit: Payer: Self-pay

## 2019-08-19 DIAGNOSIS — R293 Abnormal posture: Secondary | ICD-10-CM | POA: Insufficient documentation

## 2019-08-19 DIAGNOSIS — M5416 Radiculopathy, lumbar region: Secondary | ICD-10-CM | POA: Diagnosis not present

## 2019-08-19 NOTE — Therapy (Signed)
[place], [place], [address] Phone: [phone]   Fax:  [phone]  Physical Therapy Evaluation  Patient Details  Name: Martha Alvarado MRN: [id number] Date of Birth: 1966-07-09 Referring Provider (PT): Dr. Joni Fears    Encounter Date: 08/19/2019  PT End of Session - 08/19/19 1820    Visit Number  1    Number of Visits  12    Date for PT Re-Evaluation  09/30/19    PT Start Time  0915    PT Stop Time  1000    PT Time Calculation (min)  45 min    Activity Tolerance  Patient tolerated treatment well    Behavior During Therapy  [facility] for tasks assessed/performed       Past Medical History:  Diagnosis Date  . CHLAMYDIAL INFECTION, HX OF 09/13/2007  . Hyperlipidemia 05/20/2015  . HYPERTENSION 09/13/2007  . Impaired glucose tolerance 07/08/2011  . SYPHILIS 09/13/2007    Past Surgical History:  Procedure Laterality Date  . TUBAL LIGATION  1991    There were no vitals filed for this visit.   Subjective Assessment - 08/19/19 0921    Subjective  Pt presents with chronic low back pain with radiating symptoms in Rt LE. She had a flare up in June 2020.  She recently had an injection last Monday which has helped.  She has difficulty with her work duties, including lifting, pulling, walking, standing. She has not had PT before.    Limitations  Sitting;Lifting;Standing;House hold activities;Other (comment)   work   Diagnostic tests  MRI 2019 multilevel disc protrusions    Patient Stated Goals  I want to be able to have pain relief.    Currently in Pain?  Yes    Pain Score  1     Pain Location  Back    Pain Orientation  Right;Lower    Pain Descriptors / Indicators  Pressure;Tightness;Cramping    Pain Type  Chronic pain    Pain Radiating Towards  Rt buttock and Rt lower leg    Pain Onset  More than a month ago    Pain Frequency  Intermittent    Aggravating Factors   walking is ok but pulling and lifting,  can be stiff after sitting    Pain Relieving Factors  medicine,sit, rest    Effect of Pain on Daily Activities  limits her ability to work         [facility] PT Assessment - 08/19/19 0001      Assessment   Referring Provider (PT)  Dr. 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[employer]  travel, bowling, family       Cognition  Overall Cognitive Status  Within Functional Limits for tasks assessed      Sensation   Light Touch  Appears Intact      AROM   Lumbar Flexion  pain about 25% limited     Lumbar Extension  WNL no pain     Lumbar - Right Side Bend  WFL    Lumbar - Left Side Bend  WFL    Lumbar - Right Rotation  WFL    Lumbar - Left Rotation  [facility]       Strength   Right Hip Flexion  4+/5    Left Hip Flexion  4+/5    Right Knee Flexion  4+/5    Right Knee Extension  5/5    Left Knee Flexion  5/5    Left Knee Extension  5/5    Right Ankle Dorsiflexion  4+/5    Left Ankle Dorsiflexion  4+/5      Straight Leg Raise   Findings  Negative         Objective measurements completed on examination: See above findings.     PT Education - 08/19/19 1820    Education Details  PT/POC, HEP    Person(s) Educated  Patient    Methods  Explanation;Handout    Comprehension  Verbalized understanding;Returned demonstration          PT Long Term Goals - 08/19/19 1821      PT LONG TERM GOAL #1   Title  Pt will be I with HEP for low back, core    Time   6    Period  Weeks    Status  New    Target Date  09/30/19      PT LONG TERM GOAL #2   Title  Pt will be able to squat, lift with good form for good body mechanics and to continue work    Baseline  --    Time  6    Period  Weeks    Status  New    Target Date  09/30/19      PT LONG TERM GOAL #3   Title  Pt will be able to walk for fitness >1 mile without increased back pain    Time  6    Period  Weeks    Status  New    Target Date  09/30/19      PT LONG TERM GOAL #4   Title  Pt will report centralization of  LE symptoms,  min and no more than 2 times per week.    Time  6    Period  Weeks    Status  New    Target Date  09/30/19      PT LONG TERM GOAL #5   Title  Patient will improve FOTO score to 26% or better    Baseline  42%    Time  6    Period  Weeks    Status  New    Target Date  09/30/19             Plan - 08/19/19 1942    Clinical Impression Statement  Patient presents for  mod complexity eval of low back pain with radiculopathy on Rt side.  She has had this problem prior (1 year) , her injection last week helped her a great deal. She has pain that radiates mostly to her Rt thigh and occasionally lateral aspect of lower leg along the L5-S1 distribution.  She has good  strength and shows mild weakness in Rt knee flexion. She should do well with PT for corrective exercises and self care, posture and body mechanics training.    Personal Factors and Comorbidities  Comorbidity 1;Time since onset of injury/illness/exacerbation;Past/Current Experience    Comorbidities  chronic LBP, HTN, obesity    Examination-Activity Limitations  Transfers;Squat;Lift;Bend;Caring for Others;Stand;Stairs    Examination-Participation Restrictions  Cleaning;Community Activity    Stability/Clinical Decision Making  Evolving/Moderate complexity    Clinical Decision Making  Moderate    Rehab Potential  Excellent    PT Frequency  2x / week    PT Duration  6 weeks    PT  Treatment/Interventions  ADLs/Self Care Home Management;Cryotherapy;Ultrasound;Dry needling;Passive range of motion;Manual techniques;Patient/family education;Therapeutic exercise;Therapeutic activities;Moist Heat;Traction;Electrical Stimulation;Functional mobility training;Neuromuscular re-education    PT Next Visit Plan  check HEP, consider manual, traction, dry needling and begin core , lifting    PT Home Exercise Plan  PPT, LTR and knee to chest    Consulted and Agree with Plan of Care  Patient       Patient will benefit from skilled therapeutic intervention in order to improve the following deficits and impairments:  Decreased mobility, Hypomobility, Obesity, Improper body mechanics, Pain, Postural dysfunction, Impaired UE functional use, Impaired flexibility, Increased fascial restricitons, Decreased strength, Decreased range of motion  Visit Diagnosis: 1. Radiculopathy, lumbar region   2. Abnormal posture        Problem List Patient Active Problem List   Diagnosis Date Noted  . Right lumbar radiculopathy 05/27/2018  . Acute right-sided low back pain with right-sided sciatica 01/22/2018  . Hyperlipidemia 05/20/2015  . 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 Early menopause 05/01/2012  . Preventative health care 07/09/2011  . Impaired glucose tolerance 07/08/2011  . SYPHILIS 09/13/2007  . OBESITY NOS 09/13/2007  . Essential hypertension 09/13/2007  . CHLAMYDIAL INFECTION, HX OF 09/13/2007    Lavern Maslow 08/19/2019, 7:49 PM  [place], [place], [address] Phone: [phone]   Fax:  [phone]  Name: Martha Alvarado MRN: [id number] Date of Birth: 11-13-1966   Raeford Razor, PT 08/19/19 7:49 PM Phone: [phone] Fax: [phone]

## 2019-08-31 ENCOUNTER — Ambulatory Visit: Payer: 59 | Admitting: Physical Therapy

## 2019-08-31 ENCOUNTER — Encounter: Payer: Self-pay | Admitting: Physical Therapy

## 2019-08-31 ENCOUNTER — Other Ambulatory Visit: Payer: Self-pay

## 2019-08-31 DIAGNOSIS — R293 Abnormal posture: Secondary | ICD-10-CM | POA: Diagnosis not present

## 2019-08-31 DIAGNOSIS — M5416 Radiculopathy, lumbar region: Secondary | ICD-10-CM | POA: Diagnosis not present

## 2019-08-31 NOTE — Therapy (Signed)
[place], [place], [address] Phone: [phone]   Fax:  [phone]  Physical Therapy Treatment  Patient Details  Name: Martha Alvarado MRN: [id number] Date of Birth: 02/24/1966 Referring Provider (PT): Dr. Joni Fears    Encounter Date: 08/31/2019  PT End of Session - 08/31/19 1319    Visit Number  2    Number of Visits  12    Date for PT Re-Evaluation  09/30/19    PT Start Time  0932    PT Stop Time  1015    PT Time Calculation (min)  43 min    Activity Tolerance  Patient tolerated treatment well    Behavior During Therapy  [facility] for tasks assessed/performed       Past Medical History:  Diagnosis Date  . CHLAMYDIAL INFECTION, HX OF 09/13/2007  . Hyperlipidemia 05/20/2015  . HYPERTENSION 09/13/2007  . Impaired glucose tolerance 07/08/2011  . SYPHILIS 09/13/2007    Past Surgical History:  Procedure Laterality Date  . TUBAL LIGATION  1991    There were no vitals filed for this visit.                    [facility] Adult PT Treatment/Exercise - 08/31/19 0001      Lumbar Exercises: Stretches   Passive Hamstring Stretch  3 reps;20 seconds    Passive Hamstring Stretch Limitations  seated 3x20 sec hold     Lower Trunk Rotation  10 seconds    Lower Trunk Rotation Limitations  x 10       Lumbar Exercises: Supine   Other Supine Lumbar Exercises  supine march 2x10 with breathing. Increased difficulty with right leg     Pelvic Tilt Limitations  x10 5 sec hold with cuing for breathing       Manual Therapy   Manual Therapy  Soft tissue mobilization;Myofascial release;Manual Traction    Manual therapy comments  skilled palpation of trigger points     Soft tissue mobilization  to glut medius     Myofascial Release  IASTYM to upper gluteals and right lumbar spine     Manual Traction  LAD 5x30 sec hold        Trigger Point Dry Needling - 08/31/19 0001    Consent Given?  Yes    Education  Handout Provided  Yes    Muscles Treated Back/Hip  Gluteus medius    Dry Needling Comments  3x with 30x75 needle     Gluteus Medius Response  Twitch response elicited           PT Education - 08/31/19 1319    Education Details  improtance of core strengthening    Person(s) Educated  Patient    Methods  Explanation;Demonstration;Tactile cues;Verbal cues    Comprehension  Verbalized understanding;Returned demonstration;Verbal cues required;Tactile cues required          PT Long Term Goals - 08/19/19 1821      PT LONG TERM GOAL #1   Title  Pt will be I with HEP for low back, core    Time  6    Period  Weeks    Status  New    Target Date  09/30/19      PT LONG TERM GOAL #2   Title  Pt will be able to squat, lift with good form for good body mechanics and to continue work    Baseline  --    Time  6  Period  Weeks    Status  New    Target Date  09/30/19      PT LONG TERM GOAL #3   Title  Pt will be able to walk for fitness >1 mile without increased back pain    Time  6    Period  Weeks    Status  New    Target Date  09/30/19      PT LONG TERM GOAL #4   Title  Pt will report centralization of  LE symptoms,  min and no more than 2 times per week.    Time  6    Period  Weeks    Status  New    Target Date  09/30/19      PT LONG TERM GOAL #5   Title  Patient will improve FOTO score to 26% or better    Baseline  42%    Time  6    Period  Weeks    Status  New    Target Date  09/30/19            Plan - 08/31/19 1321    Clinical Impression Statement  Good tolerance to dry needling. She had a great twtich respose to all 3 needles. She reopted more difficulty with right leg with supine march. She was advised that this is improtant to strengthen becuase we are using the same muscles she uses to walk. She was advised to continue with her stretching and strengthening at home. Therapy reviewed exercises to reduce post needle strengthening.    Personal Factors and  Comorbidities  Comorbidity 1;Time since onset of injury/illness/exacerbation;Past/Current Experience    Comorbidities  chronic LBP, HTN, obesity    Examination-Activity Limitations  Transfers;Squat;Lift;Bend;Caring for Others;Stand;Stairs    Examination-Participation Restrictions  Cleaning;Community Activity    Stability/Clinical Decision Making  Evolving/Moderate complexity    Clinical Decision Making  Moderate    Rehab Potential  Excellent    PT Frequency  2x / week    PT Duration  6 weeks    PT Treatment/Interventions  ADLs/Self Care Home Management;Cryotherapy;Ultrasound;Dry needling;Passive range of motion;Manual techniques;Patient/family education;Therapeutic exercise;Therapeutic activities;Moist Heat;Traction;Electrical Stimulation;Functional mobility training;Neuromuscular re-education    PT Next Visit Plan  check HEP, consider manual, traction, dry needling and begin core , lifting    PT Home Exercise Plan  PPT, LTR and knee to chest; pirifromis stretch    Consulted and Agree with Plan of Care  Patient       Patient will benefit from skilled therapeutic intervention in order to improve the following deficits and impairments:  Decreased mobility, Hypomobility, Obesity, Improper body mechanics, Pain, Postural dysfunction, Impaired UE functional use, Impaired flexibility, Increased fascial restricitons, Decreased strength, Decreased range of motion  Visit Diagnosis: Radiculopathy, lumbar region  Abnormal posture     Problem List Patient Active Problem List   Diagnosis Date Noted  . Right lumbar radiculopathy 05/27/2018  . Acute right-sided low back pain with right-sided sciatica 01/22/2018  . Hyperlipidemia 05/20/2015  . 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 Early menopause 05/01/2012  . Preventative health care 07/09/2011  . Impaired glucose tolerance 07/08/2011  . SYPHILIS 09/13/2007  . OBESITY NOS 09/13/2007  . Essential hypertension 09/13/2007  . CHLAMYDIAL INFECTION, HX  OF 09/13/2007    Carney Living  PT DPT 08/31/2019, 1:25 PM  [facility] [address] [place], [place], [address] Phone: [phone]   Fax:  [phone]  Name: Martha Alvarado MRN: [id number] Date of  Birth: January 30, 1966

## 2019-09-04 ENCOUNTER — Other Ambulatory Visit: Payer: Self-pay

## 2019-09-04 ENCOUNTER — Ambulatory Visit: Payer: 59 | Attending: Orthopaedic Surgery | Admitting: Physical Therapy

## 2019-09-04 DIAGNOSIS — R293 Abnormal posture: Secondary | ICD-10-CM | POA: Insufficient documentation

## 2019-09-04 DIAGNOSIS — M5416 Radiculopathy, lumbar region: Secondary | ICD-10-CM | POA: Diagnosis not present

## 2019-09-04 NOTE — Therapy (Signed)
[place], [place], [address] Phone: [phone]   Fax:  [phone]  Physical Therapy Treatment  Patient Details  Name: Martha Alvarado MRN: [id number] Date of Birth: [birth year] Referring Provider (PT): Dr. Joni Fears    Encounter Date: 09/04/2019  PT End of Session - 09/04/19 1056    Visit Number  3    Number of Visits  12    Date for PT Re-Evaluation  09/30/19    PT Start Time  2409    PT Stop Time  1130    PT Time Calculation (min)  41 min    Activity Tolerance  Patient tolerated treatment well    Behavior During Therapy  [facility] for tasks assessed/performed       Past Medical History:  Diagnosis Date  . CHLAMYDIAL INFECTION, HX OF 09/13/2007  . Hyperlipidemia 05/20/2015  . HYPERTENSION 09/13/2007  . Impaired glucose tolerance 07/08/2011  . SYPHILIS 09/13/2007    Past Surgical History:  Procedure Laterality Date  . TUBAL LIGATION  1991    There were no vitals filed for this visit.  Subjective Assessment - 09/04/19 1053    Subjective  Pt had needling last visit.  She thinks it helped, was sore.  Worked last night 7-7 and has not slept yet.  Pain only in Rt side of lumbar.  No leg pain.    Currently in Pain?  Yes    Pain Score  5     Pain Location  Back    Pain Orientation  Right;Lower    Pain Descriptors / Indicators  Aching;Sore    Pain Type  Chronic pain    Pain Onset  More than a month ago        [facility] Adult PT Treatment/Exercise - 09/04/19 0001      Lumbar Exercises: Stretches   Active Hamstring Stretch  Right;Left;3 reps;30 seconds    Single Knee to Chest Stretch  2 reps;30 seconds    Lower Trunk Rotation  10 seconds    Lower Trunk Rotation Limitations  x 10     Pelvic Tilt Limitations  x10 5 sec hold with cuing for breathing     Piriformis Stretch  3 reps;30 seconds    Piriformis Stretch Limitations  seated and supine     Other Lumbar Stretch Exercise  seated ball roll out x10        Lumbar Exercises: Standing   Other Standing Lumbar Exercises  standing core press with ball x 10 breathing       Lumbar Exercises: Supine   Bridge  10 reps    Basic Lumbar Stabilization  10 reps    Basic Lumbar Stabilization Limitations  clam, march x 10 each       Manual Therapy   Soft tissue mobilization  to glut medius     Manual Traction  LAD 5x30 sec hold          PT Long Term Goals - 09/04/19 1058      PT LONG TERM GOAL #1   Title  Pt will be I with HEP for low back, core    Status  On-going      PT LONG TERM GOAL #2   Title  Pt will be able to squat, lift with good form for good body mechanics and to continue work    Status  On-going      PT LONG TERM GOAL #3   Title  Pt will be able  to walk for fitness >1 mile without increased back pain    Status  On-going      PT LONG TERM GOAL #4   Title  Pt will report centralization of  LE symptoms,  min and no more than 2 times per week.    Status  Partially Met      PT LONG TERM GOAL #5   Title  Patient will improve FOTO score to 26% or better    Status  On-going            Plan - 09/04/19 1107    Clinical Impression Statement  Patient improving, increased pain today due to coming right from work.  She is open to more needling in the future.  Reinstated HEP and importance of regular routine.    PT Treatment/Interventions  ADLs/Self Care Home Management;Cryotherapy;Ultrasound;Dry needling;Passive range of motion;Manual techniques;Patient/family education;Therapeutic exercise;Therapeutic activities;Moist Heat;Traction;Electrical Stimulation;Functional mobility training;Neuromuscular re-education    PT Next Visit Plan  check HEP, consider manual, dry needling and begin core , lifting    PT Home Exercise Plan  PPT, LTR and knee to chest; pirifromis stretch    Consulted and Agree with Plan of Care  Patient       Patient will benefit from skilled therapeutic intervention in order to improve the following deficits  and impairments:  Decreased mobility, Hypomobility, Obesity, Improper body mechanics, Pain, Postural dysfunction, Impaired UE functional use, Impaired flexibility, Increased fascial restricitons, Decreased strength, Decreased range of motion  Visit Diagnosis: Radiculopathy, lumbar region  Abnormal posture     Problem List Patient Active Problem List   Diagnosis Date Noted  . Right lumbar radiculopathy 05/27/2018  . Acute right-sided low back pain with right-sided sciatica 01/22/2018  . Hyperlipidemia 05/20/2015  . 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 Early menopause 05/01/2012  . Preventative health care 07/09/2011  . Impaired glucose tolerance 07/08/2011  . SYPHILIS 09/13/2007  . OBESITY NOS 09/13/2007  . Essential hypertension 09/13/2007  . CHLAMYDIAL INFECTION, HX OF 09/13/2007    PAA,JENNIFER 09/04/2019, 11:33 AM  [facility] [address] [place], [place], [address] Phone: [phone]   Fax:  [phone]  Name: Martha Alvarado MRN: [id number] Date of Birth: [birth year]  Raeford Razor, PT 09/04/19 11:33 AM Phone: [phone] Fax: [phone]

## 2019-09-08 ENCOUNTER — Ambulatory Visit: Payer: 59 | Admitting: Physical Therapy

## 2019-09-08 ENCOUNTER — Other Ambulatory Visit: Payer: Self-pay

## 2019-09-08 ENCOUNTER — Encounter: Payer: Self-pay | Admitting: Physical Therapy

## 2019-09-08 DIAGNOSIS — M5416 Radiculopathy, lumbar region: Secondary | ICD-10-CM | POA: Diagnosis not present

## 2019-09-08 DIAGNOSIS — R293 Abnormal posture: Secondary | ICD-10-CM

## 2019-09-08 NOTE — Therapy (Signed)
[place], [place], [address] Phone: [phone]   Fax:  [phone]  Physical Therapy Treatment  Patient Details  Name: Martha Alvarado MRN: [id number] Date of Birth: 28-Mar-1966 Referring Provider (PT): Dr. Joni Fears    Encounter Date: 09/08/2019  PT End of Session - 09/08/19 1249    Visit Number  4    Number of Visits  12    Date for PT Re-Evaluation  09/30/19    PT Start Time  0930    PT Stop Time  1013    PT Time Calculation (min)  43 min    Activity Tolerance  Patient tolerated treatment well    Behavior During Therapy  [facility] for tasks assessed/performed       Past Medical History:  Diagnosis Date  . CHLAMYDIAL INFECTION, HX OF 09/13/2007  . Hyperlipidemia 05/20/2015  . HYPERTENSION 09/13/2007  . Impaired glucose tolerance 07/08/2011  . SYPHILIS 09/13/2007    Past Surgical History:  Procedure Laterality Date  . TUBAL LIGATION  1991    There were no vitals filed for this visit.  Subjective Assessment - 09/08/19 0949    Subjective  Patient reports her back has been better. She noticed an improvement in pain over the weekednd. She has been working on her stretches and exercises.    Limitations  Sitting;Lifting;Standing;House hold activities;Other (comment)    Diagnostic tests  MRI 2019 multilevel disc protrusions    Patient Stated Goals  I want to be able to have pain relief.    Currently in Pain?  Yes    Pain Score  2     Pain Location  Back    Pain Orientation  Right    Pain Descriptors / Indicators  Aching    Pain Type  Chronic pain    Pain Onset  More than a month ago    Pain Frequency  Constant    Aggravating Factors   pulling and lifting    Pain Relieving Factors  medicine    Effect of Pain on Daily Activities  limitis her                       [facility] Adult PT Treatment/Exercise - 09/08/19 0001      Lumbar Exercises: Stretches   Active Hamstring Stretch   Right;Left;3 reps;30 seconds    Lower Trunk Rotation  10 seconds    Lower Trunk Rotation Limitations  x 10     Pelvic Tilt Limitations  x10 5 sec hold with cuing for breathing       Lumbar Exercises: Standing   Other Standing Lumbar Exercises  standing march with abdominal brace 2x10; hip abduction x10 bilateral; hip extension x10 bilateral       Lumbar Exercises: Supine   Bridge  10 reps    Basic Lumbar Stabilization  10 reps    Basic Lumbar Stabilization Limitations  clam, march x 10 each       Manual Therapy   Manual therapy comments  skilled palpation of trigger points     Soft tissue mobilization  to glut medius     Manual Traction  LAD 5x30 sec hold        Trigger Point Dry Needling - 09/08/19 0001    Consent Given?  Yes    Education Handout Provided  Yes    Muscles Treated Back/Hip  Gluteus medius    Dry Needling Comments  30x60 3 spots in the  glut medius     Gluteus Medius Response  Twitch response elicited           PT Education - 09/08/19 1249    Education Details  HEP and symptom mangement; benefits and risk of TPDN    Person(s) Educated  Patient    Methods  Explanation;Demonstration;Tactile cues;Verbal cues    Comprehension  Verbalized understanding;Returned demonstration;Verbal cues required;Tactile cues required          PT Long Term Goals - 09/08/19 1314      PT LONG TERM GOAL #1   Title  Pt will be I with HEP for low back, core    Time  6    Period  Weeks    Status  On-going      PT LONG TERM GOAL #2   Title  Pt will be able to squat, lift with good form for good body mechanics and to continue work    Time  6    Period  Weeks    Status  On-going      PT LONG TERM GOAL #3   Title  Pt will be able to walk for fitness >1 mile without increased back pain    Time  6    Period  Weeks    Status  On-going      PT LONG TERM GOAL #4   Title  Pt will report centralization of  LE symptoms,  min and no more than 2 times per week.    Time  6     Period  Weeks    Status  On-going      PT LONG TERM GOAL #5   Title  Patient will improve FOTO score to 26% or better    Baseline  42%    Period  Weeks    Status  On-going            Plan - 09/08/19 1251    Clinical Impression Statement  Patient is making great progress. She was given standing exercises to continue to work on right hip flexor strength. She feels like her right leg is less "heavey". Therapy will continue to advance patient as tolerated.    Personal Factors and Comorbidities  Comorbidity 1;Time since onset of injury/illness/exacerbation;Past/Current Experience    Comorbidities  chronic LBP, HTN, obesity    Examination-Activity Limitations  Transfers;Squat;Lift;Bend;Caring for Others;Stand;Stairs    Examination-Participation Restrictions  Cleaning;Community Activity    Stability/Clinical Decision Making  Evolving/Moderate complexity    Clinical Decision Making  Moderate    Rehab Potential  Excellent    PT Frequency  2x / week    PT Duration  6 weeks    PT Treatment/Interventions  ADLs/Self Care Home Management;Cryotherapy;Ultrasound;Dry needling;Passive range of motion;Manual techniques;Patient/family education;Therapeutic exercise;Therapeutic activities;Moist Heat;Traction;Electrical Stimulation;Functional mobility training;Neuromuscular re-education    PT Next Visit Plan  check HEP, consider manual, dry needling and begin core , lifting    PT Home Exercise Plan  PPT, LTR and knee to chest; pirifromis stretch    Consulted and Agree with Plan of Care  Patient       Patient will benefit from skilled therapeutic intervention in order to improve the following deficits and impairments:  Decreased mobility, Hypomobility, Obesity, Improper body mechanics, Pain, Postural dysfunction, Impaired UE functional use, Impaired flexibility, Increased fascial restricitons, Decreased strength, Decreased range of motion  Visit Diagnosis: Radiculopathy, lumbar region  Abnormal  posture     Problem List Patient Active Problem List   Diagnosis Date Noted  .  Right lumbar radiculopathy 05/27/2018  . Acute right-sided low back pain with right-sided sciatica 01/22/2018  . Hyperlipidemia 05/20/2015  . 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 Early menopause 05/01/2012  . Preventative health care 07/09/2011  . Impaired glucose tolerance 07/08/2011  . SYPHILIS 09/13/2007  . OBESITY NOS 09/13/2007  . Essential hypertension 09/13/2007  . CHLAMYDIAL INFECTION, HX OF 09/13/2007    Carney Living PT DPT  09/08/2019, 1:18 PM  [facility] [address] [place], [place], [address] Phone: [phone]   Fax:  [phone]  Name: Martha Alvarado MRN: [id number] Date of Birth: Sep 19, 1966

## 2019-09-09 ENCOUNTER — Other Ambulatory Visit: Payer: Self-pay | Admitting: Gynecology

## 2019-09-09 DIAGNOSIS — Z1231 Encounter for screening mammogram for malignant neoplasm of breast: Secondary | ICD-10-CM

## 2019-09-10 ENCOUNTER — Other Ambulatory Visit: Payer: Self-pay

## 2019-09-10 ENCOUNTER — Ambulatory Visit
Admission: RE | Admit: 2019-09-10 | Discharge: 2019-09-10 | Disposition: A | Discharge status: Home / Self Care | Payer: 59 | Source: Ambulatory Visit | Attending: Gynecology | Admitting: Gynecology

## 2019-09-10 DIAGNOSIS — Z1231 Encounter for screening mammogram for malignant neoplasm of breast: Secondary | ICD-10-CM

## 2019-09-11 ENCOUNTER — Ambulatory Visit: Payer: 59 | Admitting: Physical Therapy

## 2019-09-11 ENCOUNTER — Encounter: Payer: Self-pay | Admitting: Physical Therapy

## 2019-09-11 ENCOUNTER — Other Ambulatory Visit: Payer: Self-pay

## 2019-09-11 DIAGNOSIS — R293 Abnormal posture: Secondary | ICD-10-CM

## 2019-09-11 DIAGNOSIS — M5416 Radiculopathy, lumbar region: Secondary | ICD-10-CM

## 2019-09-11 NOTE — Therapy (Signed)
[place], [place], [address] Phone: [phone]   Fax:  [phone]  Physical Therapy Treatment  Patient Details  Name: Martha Alvarado MRN: [id number] Date of Birth: 01-24-1966 Referring Provider (PT): Dr. Joni Fears    Encounter Date: 09/11/2019  PT End of Session - 09/11/19 1053    Visit Number  5    Number of Visits  12    Date for PT Re-Evaluation  09/30/19    PT Start Time  [id number]    PT Stop Time  1045    PT Time Calculation (min)  43 min    Activity Tolerance  Patient tolerated treatment well    Behavior During Therapy  [facility] for tasks assessed/performed       Past Medical History:  Diagnosis Date  . CHLAMYDIAL INFECTION, HX OF 09/13/2007  . Hyperlipidemia 05/20/2015  . HYPERTENSION 09/13/2007  . Impaired glucose tolerance 07/08/2011  . SYPHILIS 09/13/2007    Past Surgical History:  Procedure Laterality Date  . TUBAL LIGATION  1991    There were no vitals filed for this visit.  Subjective Assessment - 09/11/19 1004    Subjective  No pain today, has been feeling much better.    Currently in Pain?  No/denies         [facility] Adult PT Treatment/Exercise - 09/11/19 0001      Lumbar Exercises: Stretches   Active Hamstring Stretch  Right;Left;3 reps;30 seconds    Single Knee to Chest Stretch  3 reps;30 seconds    Lower Trunk Rotation  10 seconds    Lower Trunk Rotation Limitations  x 10       Lumbar Exercises: Aerobic   Nustep  L5 UE and LE for 6 min       Lumbar Exercises: Standing   Lifting Weights (lbs)  5 lbs dead lift at walll x 10 good technique      Lumbar Exercises: Seated   Long Arc Quad on Chair  Strengthening;Both;1 set    LAQ on Chair Limitations  on fit disc     Hip Flexion on Ball  Strengthening;Both;10 reps    Hip Flexion on Ball Limitations  fit disc     Sit to Stand  10 reps    Sit to Stand Limitations  1 set    Other Seated Lumbar Exercises  pelvic tilt A/P     Other Seated Lumbar Exercises  CORE: seated march , trunk rotation, core press 5 lbs,  overhead lift x 10       Lumbar Exercises: Supine   Pelvic Tilt  10 reps    Clam  10 reps    Bent Knee Raise  10 reps    Bridge  10 reps                  PT Long Term Goals - 09/08/19 1314      PT LONG TERM GOAL #1   Title  Pt will be I with HEP for low back, core    Time  6    Period  Weeks    Status  On-going      PT LONG TERM GOAL #2   Title  Pt will be able to squat, lift with good form for good body mechanics and to continue work    Time  6    Period  Weeks    Status  On-going      PT LONG TERM GOAL #3  Title  Pt will be able to walk for fitness >1 mile without increased back pain    Time  6    Period  Weeks    Status  On-going      PT LONG TERM GOAL #4   Title  Pt will report centralization of  LE symptoms,  min and no more than 2 times per week.    Time  6    Period  Weeks    Status  On-going      PT LONG TERM GOAL #5   Title  Patient will improve FOTO score to 26% or better    Baseline  42%    Period  Weeks    Status  On-going            Plan - 09/11/19 1053    Clinical Impression Statement  Pt continues to have min to no pain even with work activities.  She has core HEP but did not know which ones she does at home.  She plans to bring in her HEP for core next visit.  Started to learn about lifting, squatting and body mechanics.  Cont with POC.    PT Treatment/Interventions  ADLs/Self Care Home Management;Cryotherapy;Ultrasound;Dry needling;Passive range of motion;Manual techniques;Patient/family education;Therapeutic exercise;Therapeutic activities;Moist Heat;Traction;Electrical Stimulation;Functional mobility training;Neuromuscular re-education    PT Next Visit Plan  check HEP, consider manual, dry needling and begin core , lifting    PT Home Exercise Plan  PPT, LTR and knee to chest; pirifromis stretch, has core    Consulted and Agree with Plan of Care   Patient       Patient will benefit from skilled therapeutic intervention in order to improve the following deficits and impairments:  Decreased mobility, Hypomobility, Obesity, Improper body mechanics, Pain, Postural dysfunction, Impaired UE functional use, Impaired flexibility, Increased fascial restricitons, Decreased strength, Decreased range of motion  Visit Diagnosis: Radiculopathy, lumbar region  Abnormal posture     Problem List Patient Active Problem List   Diagnosis Date Noted  . Right lumbar radiculopathy 05/27/2018  . Acute right-sided low back pain with right-sided sciatica 01/22/2018  . Hyperlipidemia 05/20/2015  . 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 Early menopause 05/01/2012  . Preventative health care 07/09/2011  . Impaired glucose tolerance 07/08/2011  . SYPHILIS 09/13/2007  . OBESITY NOS 09/13/2007  . Essential hypertension 09/13/2007  . CHLAMYDIAL INFECTION, HX OF 09/13/2007    PAA,JENNIFER 09/11/2019, 10:56 AM  [facility] [address] [place], [place], [address] Phone: [phone]   Fax:  [phone]  Name: Martha Alvarado MRN: [id number] Date of Birth: 01/20/1966  Raeford Razor, PT 09/11/19 10:56 AM Phone: [phone] Fax: [phone]

## 2019-09-14 ENCOUNTER — Ambulatory Visit: Payer: 59 | Admitting: Physical Therapy

## 2019-09-14 ENCOUNTER — Encounter: Payer: Self-pay | Admitting: Physical Therapy

## 2019-09-14 ENCOUNTER — Other Ambulatory Visit: Payer: Self-pay

## 2019-09-14 DIAGNOSIS — M5416 Radiculopathy, lumbar region: Secondary | ICD-10-CM | POA: Diagnosis not present

## 2019-09-14 DIAGNOSIS — R293 Abnormal posture: Secondary | ICD-10-CM | POA: Diagnosis not present

## 2019-09-14 NOTE — Therapy (Signed)
[place], [place], [address] Phone: [phone]   Fax:  [phone]  Physical Therapy Treatment  Patient Details  Name: Martha Alvarado MRN: [id number] Date of Birth: 1966-10-16 Referring Provider (PT): Dr. Joni Fears    Encounter Date: 09/14/2019  PT End of Session - 09/14/19 1358    Visit Number  6    Number of Visits  12    Date for PT Re-Evaluation  09/30/19    PT Start Time  1018    PT Stop Time  1100    PT Time Calculation (min)  42 min    Activity Tolerance  Patient tolerated treatment well    Behavior During Therapy  [facility] for tasks assessed/performed       Past Medical History:  Diagnosis Date  . CHLAMYDIAL INFECTION, HX OF 09/13/2007  . Hyperlipidemia 05/20/2015  . HYPERTENSION 09/13/2007  . Impaired glucose tolerance 07/08/2011  . SYPHILIS 09/13/2007    Past Surgical History:  Procedure Laterality Date  . TUBAL LIGATION  1991    There were no vitals filed for this visit.  Subjective Assessment - 09/14/19 1023    Subjective  Patient went back to work. She reported feeling a little sore but she was able to rest and do her stretches.    Limitations  Sitting;Lifting;Standing;House hold activities;Other (comment)    Diagnostic tests  MRI 2019 multilevel disc protrusions    Patient Stated Goals  I want to be able to have pain relief.    Currently in Pain?  No/denies                       [facility] Adult PT Treatment/Exercise - 09/14/19 0001      Self-Care   Other Self-Care Comments   Patient had reported she couldnt find her home sheets. Therapy printed out all of the basic exercises she has been doing.        Lumbar Exercises: Stretches   Active Hamstring Stretch  Right;Left;3 reps;30 seconds    Lower Trunk Rotation  10 seconds    Lower Trunk Rotation Limitations  x 10       Lumbar Exercises: Aerobic   Nustep  L5 UE and LE for 6 min       Lumbar Exercises: Supine    Pelvic Tilt  10 reps    Clam  10 reps    Bent Knee Raise  10 reps    Bridge  10 reps      Manual Therapy   Soft tissue mobilization  to glut medius     Manual Traction  LAD 5x30 sec hold              PT Education - 09/14/19 1026    Education Details  reviewed HEP and symptome management.    Person(s) Educated  Patient    Methods  Explanation;Demonstration    Comprehension  Verbalized understanding;Returned demonstration          PT Long Term Goals - 09/14/19 1533      PT LONG TERM GOAL #1   Title  Pt will be I with HEP for low back, core    Time  6    Period  Weeks    Status  Achieved      PT LONG TERM GOAL #2   Title  Pt will be able to squat, lift with good form for good body mechanics and to continue work  Time  6    Period  Weeks    Status  On-going      PT LONG TERM GOAL #3   Title  Pt will be able to walk for fitness >1 mile without increased back pain    Period  Weeks    Status  On-going      PT LONG TERM GOAL #4   Title  Pt will report centralization of  LE symptoms,  min and no more than 2 times per week.    Time  6    Period  Weeks    Status  On-going      PT LONG TERM GOAL #5   Title  Patient will improve FOTO score to 26% or better    Baseline  42%    Time  6    Period  Weeks    Status  On-going            Plan - 09/14/19 1359    Clinical Impression Statement  Patient is making great rpogress. She had less tendneress to palpation in her paraspinals. She reported she lost all of her sheets at home. therapy printed out her base program and reviewed it with her. time limited ability to get to fucntional strengthening such as kettle bell liftds. She was advised to continue her HEP and we will continue with fucntional strengthening here.    Comorbidities  chronic LBP, HTN, obesity    Examination-Activity Limitations  Transfers;Squat;Lift;Bend;Caring for Others;Stand;Stairs    Stability/Clinical Decision Making  Evolving/Moderate  complexity    Clinical Decision Making  Moderate    Rehab Potential  Excellent    PT Frequency  2x / week    PT Duration  6 weeks    PT Treatment/Interventions  ADLs/Self Care Home Management;Cryotherapy;Ultrasound;Dry needling;Passive range of motion;Manual techniques;Patient/family education;Therapeutic exercise;Therapeutic activities;Moist Heat;Traction;Electrical Stimulation;Functional mobility training;Neuromuscular re-education    PT Next Visit Plan  check HEP, consider manual, dry needling and begin core , lifting    PT Home Exercise Plan  PPT, LTR and knee to chest; pirifromis stretch, has core    Consulted and Agree with Plan of Care  Patient       Patient will benefit from skilled therapeutic intervention in order to improve the following deficits and impairments:  Decreased mobility, Hypomobility, Obesity, Improper body mechanics, Pain, Postural dysfunction, Impaired UE functional use, Impaired flexibility, Increased fascial restricitons, Decreased strength, Decreased range of motion  Visit Diagnosis: Radiculopathy, lumbar region  Abnormal posture     Problem List Patient Active Problem List   Diagnosis Date Noted  . Right lumbar radiculopathy 05/27/2018  . Acute right-sided low back pain with right-sided sciatica 01/22/2018  . Hyperlipidemia 05/20/2015  . 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 Early menopause 05/01/2012  . Preventative health care 07/09/2011  . Impaired glucose tolerance 07/08/2011  . SYPHILIS 09/13/2007  . OBESITY NOS 09/13/2007  . Essential hypertension 09/13/2007  . CHLAMYDIAL INFECTION, HX OF 09/13/2007    Carney Living PT DPT  09/14/2019, 3:35 PM  [facility] [address] [place], [place], [address] Phone: [phone]   Fax:  [phone]  Name: Martha Alvarado MRN: [id number] Date of Birth: 01-Aug-1966

## 2019-09-17 ENCOUNTER — Other Ambulatory Visit: Payer: Self-pay | Admitting: Internal Medicine

## 2019-09-18 ENCOUNTER — Ambulatory Visit: Payer: 59 | Admitting: Physical Therapy

## 2019-09-22 ENCOUNTER — Ambulatory Visit: Payer: 59 | Admitting: Physical Therapy

## 2019-09-22 ENCOUNTER — Other Ambulatory Visit: Payer: Self-pay

## 2019-09-22 ENCOUNTER — Encounter: Payer: Self-pay | Admitting: Physical Therapy

## 2019-09-22 DIAGNOSIS — R293 Abnormal posture: Secondary | ICD-10-CM

## 2019-09-22 DIAGNOSIS — M5416 Radiculopathy, lumbar region: Secondary | ICD-10-CM | POA: Diagnosis not present

## 2019-09-22 NOTE — Therapy (Signed)
[place], [place], [address] Phone: [phone]   Fax:  [phone]  Physical Therapy Treatment  Patient Details  Name: Martha Alvarado MRN: [id number] Date of Birth: Aug 07, 1966 Referring Provider (PT): Dr. Joni Fears    Encounter Date: 09/22/2019  PT End of Session - 09/22/19 1022    Visit Number  7    Number of Visits  12    Date for PT Re-Evaluation  09/30/19    PT Start Time  [id number]    PT Stop Time  1055    PT Time Calculation (min)  40 min    Activity Tolerance  Patient tolerated treatment well    Behavior During Therapy  [facility] for tasks assessed/performed       Past Medical History:  Diagnosis Date  . CHLAMYDIAL INFECTION, HX OF 09/13/2007  . Hyperlipidemia 05/20/2015  . HYPERTENSION 09/13/2007  . Impaired glucose tolerance 07/08/2011  . SYPHILIS 09/13/2007    Past Surgical History:  Procedure Laterality Date  . TUBAL LIGATION  1991    There were no vitals filed for this visit.  Subjective Assessment - 09/22/19 1020    Subjective  The patient is not having back pain today. She worked over the weekend and didnt have much pain. She has been doing well.    Limitations  Sitting;Lifting;Standing;House hold activities;Other (comment)    Diagnostic tests  MRI 2019 multilevel disc protrusions    Patient Stated Goals  I want to be able to have pain relief.    Currently in Pain?  No/denies                       [facility] Adult PT Treatment/Exercise - 09/22/19 0001      Lumbar Exercises: Stretches   Active Hamstring Stretch  Right;Left;3 reps;30 seconds    Lower Trunk Rotation  10 seconds    Lower Trunk Rotation Limitations  x10    Piriformis Stretch  3 reps;30 seconds      Lumbar Exercises: Aerobic   Nustep  L5 UE and LE for 6 min       Lumbar Exercises: Standing   Other Standing Lumbar Exercises  standing hip abduction x10 ; standing hip extension x10; standing march 2x10 each        Lumbar Exercises: Supine   Pelvic Tilt  --    Clam  10 reps    Bent Knee Raise  10 reps    Bridge  10 reps    Bridge Limitations  unable to perfrom full bridge tody so patient perfromed a half bridge    Straight Leg Raise  10 reps      Manual Therapy   Manual Traction  LAD 5x30 sec hold              PT Education - 09/22/19 1022    Education Details  reviewed HEP and symptom management    Person(s) Educated  Patient    Methods  Explanation;Demonstration;Tactile cues;Verbal cues    Comprehension  Verbalized understanding;Returned demonstration          PT Long Term Goals - 09/14/19 1533      PT LONG TERM GOAL #1   Title  Pt will be I with HEP for low back, core    Time  6    Period  Weeks    Status  Achieved      PT LONG TERM GOAL #2   Title  Pt will  be able to squat, lift with good form for good body mechanics and to continue work    Time  6    Period  Weeks    Status  On-going      PT LONG TERM GOAL #3   Title  Pt will be able to walk for fitness >1 mile without increased back pain    Period  Weeks    Status  On-going      PT LONG TERM GOAL #4   Title  Pt will report centralization of  LE symptoms,  min and no more than 2 times per week.    Time  6    Period  Weeks    Status  On-going      PT LONG TERM GOAL #5   Title  Patient will improve FOTO score to 26% or better    Baseline  42%    Time  6    Period  Weeks    Status  On-going            Plan - 09/22/19 1410    Clinical Impression Statement  added standing ther--ex and a SLR today. She reported some fatigue but no increase inpain. The patient feels good with her exercises. She will likley discharge next visit.    Personal Factors and Comorbidities  Comorbidity 1;Time since onset of injury/illness/exacerbation;Past/Current Experience    Comorbidities  chronic LBP, HTN, obesity    Examination-Activity Limitations  Transfers;Squat;Lift;Bend;Caring for Others;Stand;Stairs    Clinical  Decision Making  Moderate    Rehab Potential  Excellent    PT Frequency  2x / week    PT Duration  6 weeks    PT Treatment/Interventions  ADLs/Self Care Home Management;Cryotherapy;Ultrasound;Dry needling;Passive range of motion;Manual techniques;Patient/family education;Therapeutic exercise;Therapeutic activities;Moist Heat;Traction;Electrical Stimulation;Functional mobility training;Neuromuscular re-education    PT Next Visit Plan  check HEP, consider manual, dry needling and begin core , lifting    PT Home Exercise Plan  PPT, LTR and knee to chest; pirifromis stretch, has core    Consulted and Agree with Plan of Care  Patient       Patient will benefit from skilled therapeutic intervention in order to improve the following deficits and impairments:  Decreased mobility, Hypomobility, Obesity, Improper body mechanics, Pain, Postural dysfunction, Impaired UE functional use, Impaired flexibility, Increased fascial restricitons, Decreased strength, Decreased range of motion  Visit Diagnosis: Radiculopathy, lumbar region  Abnormal posture     Problem List Patient Active Problem List   Diagnosis Date Noted  . Right lumbar radiculopathy 05/27/2018  . Acute right-sided low back pain with right-sided sciatica 01/22/2018  . Hyperlipidemia 05/20/2015  . 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 Early menopause 05/01/2012  . Preventative health care 07/09/2011  . Impaired glucose tolerance 07/08/2011  . SYPHILIS 09/13/2007  . OBESITY NOS 09/13/2007  . Essential hypertension 09/13/2007  . CHLAMYDIAL INFECTION, HX OF 09/13/2007    Carney Living PT DPT  09/22/2019, 3:31 PM  [facility] [address] [place], [place], [address] Phone: [phone]   Fax:  [phone]  Name: Martha Alvarado MRN: [id number] Date of Birth: 12/26/1966

## 2019-09-23 ENCOUNTER — Encounter: Payer: Self-pay | Admitting: Gynecology

## 2019-09-25 ENCOUNTER — Ambulatory Visit: Payer: 59 | Admitting: Physical Therapy

## 2019-09-25 ENCOUNTER — Other Ambulatory Visit: Payer: Self-pay

## 2019-09-25 DIAGNOSIS — M5416 Radiculopathy, lumbar region: Secondary | ICD-10-CM | POA: Diagnosis not present

## 2019-09-25 DIAGNOSIS — R293 Abnormal posture: Secondary | ICD-10-CM | POA: Diagnosis not present

## 2019-09-25 NOTE — Therapy (Signed)
[facility] [address] [place], [place], [address] Phone: [phone]   Fax:  [phone]  Physical Therapy Treatment/Discharge  Patient Details  Name: Martha Alvarado MRN: [id number] Date of Birth: 05/30/1966 Referring Provider (PT): Dr. Peter Whitfield    Encounter Date: 09/25/2019  PT End of Session - 09/25/19 1005    Visit Number  8    Number of Visits  12    Date for PT Re-Evaluation  09/30/19    PT Start Time  1000    PT Stop Time  1040    PT Time Calculation (min)  40 min    Activity Tolerance  Patient tolerated treatment well    Behavior During Therapy  WFL for tasks assessed/performed       Past Medical History:  Diagnosis Date  . CHLAMYDIAL INFECTION, HX OF 09/13/2007  . Hyperlipidemia 05/20/2015  . HYPERTENSION 09/13/2007  . Impaired glucose tolerance 07/08/2011  . SYPHILIS 09/13/2007    Past Surgical History:  Procedure Laterality Date  . TUBAL LIGATION  1991    There were no vitals filed for this visit.      [facility] PT Assessment - 09/25/19 0001      Observation/Other Assessments   Focus on Therapeutic Outcomes (FOTO)   27%      AROM   Lumbar Flexion  no pain, WFL stretch in LE, discomfort     Lumbar Extension  WFL no pain           [facility] Adult PT Treatment/Exercise - 09/25/19 0001      Lumbar Exercises: Stretches   Active Hamstring Stretch  Right;Left;3 reps;30 seconds    Single Knee to Chest Stretch  3 reps;30 seconds    Lower Trunk Rotation  10 seconds    Lower Trunk Rotation Limitations  x10    Pelvic Tilt  10 reps      Lumbar Exercises: Standing   Other Standing Lumbar Exercises  standing hip abduction x10 ; standing hip extension x10; standing march 2x10 each       Lumbar Exercises: Supine   Basic Lumbar Stabilization Limitations  clam, march and bent knee raise x 10     Straight Leg Raise  10 reps    Straight Leg Raises Limitations  more difficult on Rt LE       Manual Therapy    Soft tissue mobilization  glute med    Manual Traction  LAD 5x30 sec hold                   PT Long Term Goals - 09/25/19 1021      PT LONG TERM GOAL #1   Title  Pt will be I with HEP for low back, core    Status  Achieved      PT LONG TERM GOAL #2   Title  Pt will be able to squat, lift with good form for good body mechanics and to continue work    Status  Achieved      PT LONG TERM GOAL #3   Title  Pt will be able to walk for fitness >1 mile without increased back pain    Status  Achieved      PT LONG TERM GOAL #4   Title  Pt will report centralization of  LE symptoms,  min and no more than 2 times per week.    Status  Achieved      PT LONG TERM GOAL #5     Title  Patient will improve FOTO score to 26% or better    Baseline  27%    Status  Partially Met            Plan - 09/25/19 1026    Clinical Impression Statement  Pt doing great, no pain even after working full shift.  She missed her FOTO goal by 1 %.  Met all goals otherwise.  Has full HEP and has been consistent.Cont with min weakness in Rt hip.    PT Treatment/Interventions  ADLs/Self Care Home Management;Cryotherapy;Ultrasound;Dry needling;Passive range of motion;Manual techniques;Patient/family education;Therapeutic exercise;Therapeutic activities;Moist Heat;Traction;Electrical Stimulation;Functional mobility training;Neuromuscular re-education    PT Next Visit Plan  NA, DC    PT Home Exercise Plan  PPT, LTR and knee to chest; pirifromis stretch, has core, standing march and ext, abd    Consulted and Agree with Plan of Care  Patient       Patient will benefit from skilled therapeutic intervention in order to improve the following deficits and impairments:  Decreased mobility, Hypomobility, Obesity, Improper body mechanics, Pain, Postural dysfunction, Impaired UE functional use, Impaired flexibility, Increased fascial restricitons, Decreased strength, Decreased range of motion  Visit  Diagnosis: Radiculopathy, lumbar region  Abnormal posture     Problem List Patient Active Problem List   Diagnosis Date Noted  . Right lumbar radiculopathy 05/27/2018  . Acute right-sided low back pain with right-sided sciatica 01/22/2018  . Hyperlipidemia 05/20/2015  . 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 Early menopause 05/01/2012  . Preventative health care 07/09/2011  . Impaired glucose tolerance 07/08/2011  . SYPHILIS 09/13/2007  . OBESITY NOS 09/13/2007  . Essential hypertension 09/13/2007  . CHLAMYDIAL INFECTION, HX OF 09/13/2007    PAA,JENNIFER 09/25/2019, 10:43 AM  [facility] [address] [place], [place], [address] Phone: [phone]   Fax:  [phone]  Name: Martha Alvarado MRN: [id number] Date of Birth: [birth year]  Raeford Razor, PT 09/25/19 10:44 AM Phone: [phone] Fax: [phone] PHYSICAL THERAPY DISCHARGE SUMMARY  Visits from Start of Care: 8  Current functional level related to goals / functional outcomes: See above, no pain    Remaining deficits: Min Rt LE weakness   Education / Equipment: HEP, lifting , posture and body mechanics  Plan: Patient agrees to discharge.  Patient goals were met. Patient is being discharged due to meeting the stated rehab goals.  ?????    Raeford Razor, PT 09/25/19 10:44 AM Phone: [phone] Fax: [phone]

## 2019-10-16 ENCOUNTER — Other Ambulatory Visit: Payer: Self-pay | Admitting: Internal Medicine

## 2019-11-20 DIAGNOSIS — H524 Presbyopia: Secondary | ICD-10-CM | POA: Diagnosis not present

## 2019-11-20 DIAGNOSIS — H5203 Hypermetropia, bilateral: Secondary | ICD-10-CM | POA: Diagnosis not present

## 2019-11-20 DIAGNOSIS — H52223 Regular astigmatism, bilateral: Secondary | ICD-10-CM | POA: Diagnosis not present

## 2020-05-09 ENCOUNTER — Telehealth: Payer: Self-pay | Admitting: Internal Medicine

## 2020-05-09 MED ORDER — GABAPENTIN 300 MG PO CAPS: 300.0000 mg | ORAL_CAPSULE | Freq: Three times a day (TID) | ORAL | 1 refills | Status: DC

## 2020-05-09 NOTE — Telephone Encounter (Signed)
Ok to let pt know - unable to send gabapentin 90 days to mail in pharmacy due to refill policy  1 mo and 1 refill [facility] local [facility]  Pt due for ROV for further refills

## 2020-05-10 ENCOUNTER — Telehealth: Payer: Self-pay

## 2020-05-10 NOTE — Telephone Encounter (Signed)
Left detailed VM for pt of Dr. Judi Cong note along with the clinic number to call for an appointment to be made.

## 2020-06-08 ENCOUNTER — Other Ambulatory Visit: Payer: Self-pay

## 2020-06-08 ENCOUNTER — Encounter: Payer: Self-pay | Admitting: Internal Medicine

## 2020-06-08 ENCOUNTER — Other Ambulatory Visit: Payer: Self-pay | Admitting: Internal Medicine

## 2020-06-08 ENCOUNTER — Ambulatory Visit (INDEPENDENT_AMBULATORY_CARE_PROVIDER_SITE_OTHER): Payer: 59 | Admitting: Internal Medicine

## 2020-06-08 VITALS — BP 130/86 | HR 60 | Temp 98.5°F | Ht 65.0 in | Wt 232.0 lb

## 2020-06-08 DIAGNOSIS — M5441 Lumbago with sciatica, right side: Secondary | ICD-10-CM | POA: Diagnosis not present

## 2020-06-08 DIAGNOSIS — G8929 Other chronic pain: Secondary | ICD-10-CM

## 2020-06-08 DIAGNOSIS — Z1159 Encounter for screening for other viral diseases: Secondary | ICD-10-CM | POA: Diagnosis not present

## 2020-06-08 DIAGNOSIS — E538 Deficiency of other specified B group vitamins: Secondary | ICD-10-CM

## 2020-06-08 DIAGNOSIS — E559 Vitamin D deficiency, unspecified: Secondary | ICD-10-CM

## 2020-06-08 DIAGNOSIS — I10 Essential (primary) hypertension: Secondary | ICD-10-CM

## 2020-06-08 DIAGNOSIS — G5601 Carpal tunnel syndrome, right upper limb: Secondary | ICD-10-CM | POA: Diagnosis not present

## 2020-06-08 DIAGNOSIS — R7302 Impaired glucose tolerance (oral): Secondary | ICD-10-CM | POA: Diagnosis not present

## 2020-06-08 DIAGNOSIS — Z0001 Encounter for general adult medical examination with abnormal findings: Secondary | ICD-10-CM

## 2020-06-08 LAB — BASIC METABOLIC PANEL
BUN: 19 mg/dL (ref 6–23)
CO2: 33 mEq/L — ABNORMAL HIGH (ref 19–32)
Calcium: 9.8 mg/dL (ref 8.4–10.5)
Chloride: 100 mEq/L (ref 96–112)
Creatinine, Ser: 1.06 mg/dL (ref 0.40–1.20)
GFR: 65.5 mL/min (ref >60.00)
Glucose, Bld: 119 mg/dL — ABNORMAL HIGH (ref 70–99)
Potassium: 3.5 mEq/L (ref 3.5–5.1)
Sodium: 139 mEq/L (ref 135–145)

## 2020-06-08 LAB — HEMOGLOBIN A1C: Hgb A1c MFr Bld: 6.6 % — ABNORMAL HIGH (ref 4.6–6.5)

## 2020-06-08 LAB — URINALYSIS, ROUTINE W REFLEX MICROSCOPIC
Bilirubin Urine: NEGATIVE
Hgb urine dipstick: NEGATIVE
Ketones, ur: NEGATIVE
Leukocytes,Ua: NEGATIVE
Nitrite: NEGATIVE
RBC / HPF: NONE SEEN (ref 0–?)
Specific Gravity, Urine: 1.02 (ref 1.000–1.030)
Total Protein, Urine: NEGATIVE
Urine Glucose: NEGATIVE
Urobilinogen, UA: 0.2 (ref 0.0–1.0)
pH: 6 (ref 5.0–8.0)

## 2020-06-08 LAB — HEPATIC FUNCTION PANEL
ALT: 29 U/L (ref 0–35)
AST: 19 U/L (ref 0–37)
Albumin: 4.3 g/dL (ref 3.5–5.2)
Alkaline Phosphatase: 88 U/L (ref 39–117)
Bilirubin, Direct: 0.1 mg/dL (ref 0.0–0.3)
Total Bilirubin: 0.4 mg/dL (ref 0.2–1.2)
Total Protein: 7.1 g/dL (ref 6.0–8.3)

## 2020-06-08 LAB — CBC WITH DIFFERENTIAL/PLATELET
Basophils Absolute: 0 10*3/uL (ref 0.0–0.1)
Basophils Relative: 0.2 % (ref 0.0–3.0)
Eosinophils Absolute: 0.1 10*3/uL (ref 0.0–0.7)
Eosinophils Relative: 1.6 % (ref 0.0–5.0)
HCT: 42 % (ref 36.0–46.0)
Hemoglobin: 14 g/dL (ref 12.0–15.0)
Lymphocytes Relative: 50.6 % — ABNORMAL HIGH (ref 12.0–46.0)
Lymphs Abs: 2.4 10*3/uL (ref 0.7–4.0)
MCHC: 33.2 g/dL (ref 30.0–36.0)
MCV: 84.2 fl (ref 78.0–100.0)
Monocytes Absolute: 0.3 10*3/uL (ref 0.1–1.0)
Monocytes Relative: 7.5 % (ref 3.0–12.0)
Neutro Abs: 1.9 10*3/uL (ref 1.4–7.7)
Neutrophils Relative %: 40.1 % — ABNORMAL LOW (ref 43.0–77.0)
Platelets: 239 10*3/uL (ref 150.0–400.0)
RBC: 4.99 Mil/uL (ref 3.87–5.11)
RDW: 13.5 % (ref 11.5–15.5)
WBC: 4.6 10*3/uL (ref 4.0–10.5)

## 2020-06-08 LAB — LIPID PANEL
Cholesterol: 144 mg/dL (ref 0–200)
HDL: 53.7 mg/dL (ref >39.00)
LDL Cholesterol: 76 mg/dL (ref 0–99)
NonHDL: 90.16
Total CHOL/HDL Ratio: 3
Triglycerides: 71 mg/dL (ref 0.0–149.0)
VLDL: 14.2 mg/dL (ref 0.0–40.0)

## 2020-06-08 LAB — VITAMIN D 25 HYDROXY (VIT D DEFICIENCY, FRACTURES): VITD: 24.6 ng/mL — ABNORMAL LOW (ref 30.00–100.00)

## 2020-06-08 LAB — VITAMIN B12: Vitamin B-12: 363 pg/mL (ref 211–911)

## 2020-06-08 LAB — TSH: TSH: 1.85 u[IU]/mL (ref 0.35–4.50)

## 2020-06-08 MED ORDER — METHOCARBAMOL 500 MG PO TABS: 500.0000 mg | ORAL_TABLET | Freq: Three times a day (TID) | ORAL | 2 refills | Status: DC | PRN

## 2020-06-08 NOTE — Patient Instructions (Signed)
Ok to try wearing a right wrist splint at night to help the right carpal tunnell numbness  Please continue all other medications as before, and refills have been done if requested - robaxin  Please have the pharmacy call with any other refills you may need.  Please continue your efforts at being more active, low cholesterol diet, and weight control.  You are otherwise up to date with prevention measures today.  Please keep your appointments with your specialists as you may have planned  Please go to the LAB at the blood drawing area for the tests to be done  You will be contacted by phone if any changes need to be made immediately.  Otherwise, you will receive a letter about your results with an explanation, but please check with MyChart first.  Please remember to sign up for MyChart if you have not done so, as this will be important to you in the future with finding out test results, communicating by private email, and scheduling acute appointments online when needed.  Please make an Appointment to return in 6 months, or sooner if needed

## 2020-06-08 NOTE — Progress Notes (Signed)
Subjective:    Patient ID: Martha Alvarado, female    DOB: [birth year], 54 y.o.   MRN: [id number]  HPI  Here for wellness and f/u;  Overall doing ok;  Pt denies Chest pain, worsening SOB, DOE, wheezing, orthopnea, PND, worsening LE edema, palpitations, dizziness or syncope.  Pt denies neurological change such as new headache, facial or extremity weakness.  Pt denies polydipsia, polyuria, or low sugar symptoms. Pt states overall good compliance with treatment and medications, good tolerability, and has been trying to follow appropriate diet.  Pt denies worsening depressive symptoms, suicidal ideation or panic. No fever, night sweats, wt loss, loss of appetite, or other constitutional symptoms.  Pt states good ability with ADL's, has low fall risk, home safety reviewed and adequate, no other significant changes in hearing or vision, and only occasionally active with exercise. Gained 10 lbs with covid, works as Chartered certified accountant with [employer], now with mild recurring right hand numbness. Taking one K per day only instead of 2 recently as she though this might be related.  No pain or weakness Wt Readings from Last 3 Encounters:  06/08/20 232 lb (105.2 kg)  08/04/19 223 lb (101.2 kg)  07/29/19 223 lb (101.2 kg)  Also with c/o right first few fingers numbness most days for > 2 wks. Without pain or weakness. Pt continues to have recurring LBP without change in severity, bowel or bladder change, fever, wt loss,  worsening LE pain/numbness/weakness, gait change or falls. Past Medical History:  Diagnosis Date  . CHLAMYDIAL INFECTION, HX OF 09/13/2007  . Hyperlipidemia 05/20/2015  . HYPERTENSION 09/13/2007  . Impaired glucose tolerance 07/08/2011  . SYPHILIS 09/13/2007   Past Surgical History:  Procedure Laterality Date  . TUBAL LIGATION  1991    reports that she has never smoked. She has never used smokeless tobacco. She reports that she does not drink alcohol and does not use drugs. family history includes Diabetes in  her mother and sister; Hypertension in her sister and sister. No Known Allergies Current Outpatient Medications on File Prior to Visit  Medication Sig Dispense Refill  . aspirin 81 MG tablet Take 81 mg by mouth daily.      Marland Kitchen gabapentin (NEURONTIN) 300 MG capsule Take 1 capsule (300 mg total) by mouth 3 (three) times daily. 90 capsule 1  . Olmesartan-amLODIPine-HCTZ 40-5-12.5 MG TABS Take 1 tablet by mouth daily. 90 tablet 3  . potassium chloride (K-DUR) 10 MEQ tablet Take 2 tablets (20 mEq total) by mouth daily. 180 tablet 3  . rosuvastatin (CRESTOR) 10 MG tablet Take 1 tablet (10 mg total) by mouth daily. 90 tablet 3   No current facility-administered medications on file prior to visit.   Review of Systems All otherwise neg per pt     Objective:   Physical Exam BP 130/86 (BP Location: Left Arm, Patient Position: Sitting, Cuff Size: Large)   Pulse 60   Temp 98.5 F (36.9 C) (Oral)   Ht 5\' 5"  (1.651 m)   Wt 232 lb (105.2 kg)   SpO2 98%   BMI 38.61 kg/m  VS noted,  Constitutional: Pt appears in NAD HENT: Head: NCAT.  Right Ear: External ear normal.  Left Ear: External ear normal.  Eyes: . Pupils are equal, round, and reactive to light. Conjunctivae and EOM are normal Nose: without d/c or deformity Neck: Neck supple. Gross normal ROM Cardiovascular: Normal rate and regular rhythm.   Pulmonary/Chest: Effort normal and breath sounds without rales or wheezing.  Abd:  Soft, NT, ND, + BS, no organomegaly Neurological: Pt is alert. At baseline orientation, motor grossly intact Skin: Skin is warm. No rashes, other new lesions, no LE edema Psychiatric: Pt behavior is normal without agitation  All otherwise neg per pt  Lab Results  Component Value Date   WBC 4.6 06/08/2020   HGB 14.0 06/08/2020   HCT 42.0 06/08/2020   PLT 239.0 06/08/2020   GLUCOSE 119 (H) 06/08/2020   CHOL 144 06/08/2020   TRIG 71.0 06/08/2020   HDL 53.70 06/08/2020   LDLCALC 76 06/08/2020   ALT 29  06/08/2020   AST 19 06/08/2020   NA 139 06/08/2020   K 3.5 06/08/2020   CL 100 06/08/2020   CREATININE 1.06 06/08/2020   BUN 19 06/08/2020   CO2 33 (H) 06/08/2020   TSH 1.85 06/08/2020   HGBA1C 6.6 (H) 06/08/2020      Assessment & Plan:

## 2020-06-09 ENCOUNTER — Other Ambulatory Visit: Payer: Self-pay | Admitting: Internal Medicine

## 2020-06-09 LAB — HEPATITIS C ANTIBODY
Hepatitis C Ab: NONREACTIVE
SIGNAL TO CUT-OFF: 0 (ref <1.00)

## 2020-06-09 MED ORDER — VITAMIN D (ERGOCALCIFEROL) 1.25 MG (50000 UNIT) PO CAPS: 50000.0000 [IU] | ORAL_CAPSULE | ORAL | 0 refills | Status: DC

## 2020-06-12 ENCOUNTER — Encounter: Payer: Self-pay | Admitting: Internal Medicine

## 2020-06-12 NOTE — Assessment & Plan Note (Signed)
Ok for robaxin refill prn,  to f/u any worsening symptoms or concerns

## 2020-06-12 NOTE — Assessment & Plan Note (Signed)
To continue oral repalcement

## 2020-06-12 NOTE — Assessment & Plan Note (Signed)
stable overall by history and exam, recent data reviewed with pt, and pt to continue medical treatment as before,  to f/u any worsening symptoms or concerns  

## 2020-06-12 NOTE — Assessment & Plan Note (Addendum)
Mild, for wrist splint qhs until improved  I spent 31 minutes in addition to time for CPX wellness examination in preparing to see the patient by review of recent labs, imaging and procedures, obtaining and reviewing separately obtained history, communicating with the patient and family or caregiver, ordering medications, tests or procedures, and documenting clinical information in the EHR including the differential Dx, treatment, and any further evaluation and other management of right cts, right sciatica, vit d def, hyperglycemia, htn

## 2020-06-12 NOTE — Assessment & Plan Note (Signed)

## 2020-07-06 ENCOUNTER — Encounter: Payer: Self-pay | Admitting: Internal Medicine

## 2020-07-06 NOTE — Telephone Encounter (Signed)
Ok but you may want to have an Office visit as some of the questions like how much time to be excused from work would otherwise be a guess on my part

## 2020-07-07 NOTE — Telephone Encounter (Signed)
I have received FMLA via fax from [employer]. Forms will be discussed at appointment tomorrow (7/9).

## 2020-07-08 ENCOUNTER — Ambulatory Visit: Payer: 59 | Admitting: Internal Medicine

## 2020-07-08 ENCOUNTER — Encounter: Payer: Self-pay | Admitting: Internal Medicine

## 2020-07-08 ENCOUNTER — Other Ambulatory Visit: Payer: Self-pay

## 2020-07-08 DIAGNOSIS — R7302 Impaired glucose tolerance (oral): Secondary | ICD-10-CM

## 2020-07-08 DIAGNOSIS — M5416 Radiculopathy, lumbar region: Secondary | ICD-10-CM | POA: Diagnosis not present

## 2020-07-08 DIAGNOSIS — I10 Essential (primary) hypertension: Secondary | ICD-10-CM | POA: Diagnosis not present

## 2020-07-08 NOTE — Assessment & Plan Note (Addendum)
Stable, but ok for FMLA to be done at ok for missed work up to 1 day per wk for 6 mo  I spent 31 minutes in preparing to see the patient by review of recent labs, imaging and procedures, obtaining and reviewing separately obtained history, communicating with the patient and family or caregiver, ordering medications, tests or procedures, and documenting clinical information in the EHR including the differential Dx, treatment, and any further evaluation and other management of right lumbar radiculopathy, htn, hyperglycemia

## 2020-07-08 NOTE — Assessment & Plan Note (Signed)
stable overall by history and exam, recent data reviewed with pt, and pt to continue medical treatment as before,  to f/u any worsening symptoms or concerns  

## 2020-07-08 NOTE — Progress Notes (Signed)
Subjective:    Patient ID: Martha Alvarado, female    DOB: [birth year], 54 y.o.   MRN: [id number]  HPI  Here to f/u, essentially no change in recent right LBP - Pt continues to have recurring LBP without change in severity, bowel or bladder change, fever, wt loss,  worsening LE pain/numbness/weakness, gait change or falls.  Has missed 3 days at work from about June 15, and was suggested by supervisor to work on [employer], as many coworkers have this as well.  Pt denies chest pain, increased sob or doe, wheezing, orthopnea, PND, increased LE swelling, palpitations, dizziness or syncope.  Pt denies new neurological symptoms such as new headache, or facial or extremity weakness or numbness   Past Medical History:  Diagnosis Date  . CHLAMYDIAL INFECTION, HX OF 09/13/2007  . Hyperlipidemia 05/20/2015  . HYPERTENSION 09/13/2007  . Impaired glucose tolerance 07/08/2011  . SYPHILIS 09/13/2007   Past Surgical History:  Procedure Laterality Date  . TUBAL LIGATION  1991    reports that she has never smoked. She has never used smokeless tobacco. She reports that she does not drink alcohol and does not use drugs. family history includes Diabetes in her mother and sister; Hypertension in her sister and sister. No Known Allergies Current Outpatient Medications on File Prior to Visit  Medication Sig Dispense Refill  . aspirin 81 MG tablet Take 81 mg by mouth daily.      Marland Kitchen gabapentin (NEURONTIN) 300 MG capsule Take 1 capsule (300 mg total) by mouth 3 (three) times daily. 90 capsule 1  . lidocaine (LIDODERM) 5 % PLACE 1 PATCH TO AREA OF DISCOMFORT ON THE SKIN DAILY. REMOVE & DISCARD WITHIN 12 HRS OF PLACEMENT 30 patch 0  . methocarbamol (ROBAXIN) 500 MG tablet Take 1 tablet (500 mg total) by mouth every 8 (eight) hours as needed. 60 tablet 2  . Olmesartan-amLODIPine-HCTZ 40-5-12.5 MG TABS Take 1 tablet by mouth daily. 90 tablet 3  . potassium chloride (K-DUR) 10 MEQ tablet Take 2 tablets (20 mEq total) by mouth  daily. 180 tablet 3  . rosuvastatin (CRESTOR) 10 MG tablet Take 1 tablet (10 mg total) by mouth daily. 90 tablet 3  . Vitamin D, Ergocalciferol, (DRISDOL) 1.25 MG (50000 UNIT) CAPS capsule Take 1 capsule (50,000 Units total) by mouth every 7 (seven) days. 12 capsule 0   No current facility-administered medications on file prior to visit.   Review of Systems All otherwise neg per pt     Objective:   Physical Exam BP 128/88 (BP Location: Right Arm, Patient Position: Sitting, Cuff Size: Large)   Pulse 70   Temp 99 F (37.2 C) (Oral)   Ht 5\' 5"  (1.651 m)   Wt 232 lb (105.2 kg)   SpO2 98%   BMI 38.61 kg/m  VS noted,  Constitutional: Pt appears in NAD HENT: Head: NCAT.  Right Ear: External ear normal.  Left Ear: External ear normal.  Eyes: . Pupils are equal, round, and reactive to light. Conjunctivae and EOM are normal Nose: without d/c or deformity Neck: Neck supple. Gross normal ROM Cardiovascular: Normal rate and regular rhythm.   Pulmonary/Chest: Effort normal and breath sounds without rales or wheezing.  Abd:  Soft, NT, ND, + BS, no organomegaly Neurological: Pt is alert. At baseline orientation, motor grossly intact Spine nontender, but with diffuse tender right lumbar paravertebral and upper right buttock area  Skin: Skin is warm. No rashes, other new lesions, no LE edema Psychiatric: Pt behavior is  normal without agitation  All otherwise neg per pt Lab Results  Component Value Date   WBC 4.6 06/08/2020   HGB 14.0 06/08/2020   HCT 42.0 06/08/2020   PLT 239.0 06/08/2020   GLUCOSE 119 (H) 06/08/2020   CHOL 144 06/08/2020   TRIG 71.0 06/08/2020   HDL 53.70 06/08/2020   LDLCALC 76 06/08/2020   ALT 29 06/08/2020   AST 19 06/08/2020   NA 139 06/08/2020   K 3.5 06/08/2020   CL 100 06/08/2020   CREATININE 1.06 06/08/2020   BUN 19 06/08/2020   CO2 33 (H) 06/08/2020   TSH 1.85 06/08/2020   HGBA1C 6.6 (H) 06/08/2020         Assessment & Plan:

## 2020-07-08 NOTE — Patient Instructions (Signed)
Please continue all other medications as before, and refills have been done if requested.  Please have the pharmacy call with any other refills you may need.  Please continue your efforts at being more active, low cholesterol diet, and weight control.  Please keep your appointments with your specialists as you may have planned  We will fill out the FMLA forms when they arrive for 1 days per wk for 6 months

## 2020-07-08 NOTE — Telephone Encounter (Signed)
I started working on forms we are missing the last page. I have requested [employer] to re fax the missing page.

## 2020-07-11 NOTE — Telephone Encounter (Signed)
Last page of forms have been received. Placed in providers box to review and sign.

## 2020-07-12 DIAGNOSIS — Z0279 Encounter for issue of other medical certificate: Secondary | ICD-10-CM

## 2020-07-12 NOTE — Telephone Encounter (Signed)
Forms have been faxed to [phone], Copy sent to scan &Charged for.   My Chart message sent to inform patient, &Orginail mailed to patient for her records.

## 2020-07-21 ENCOUNTER — Encounter: Payer: Self-pay | Admitting: Orthopaedic Surgery

## 2020-07-21 ENCOUNTER — Ambulatory Visit: Payer: 59 | Admitting: Orthopaedic Surgery

## 2020-07-21 DIAGNOSIS — G8929 Other chronic pain: Secondary | ICD-10-CM | POA: Diagnosis not present

## 2020-07-21 DIAGNOSIS — M5441 Lumbago with sciatica, right side: Secondary | ICD-10-CM | POA: Diagnosis not present

## 2020-07-21 MED ORDER — METHYLPREDNISOLONE 4 MG PO TBPK
ORAL_TABLET | ORAL | 0 refills | Status: DC
Start: 2020-07-21 — End: 2020-07-29

## 2020-07-21 MED FILL — METHYLPREDNISOLONE 4 MG TBP: 4 | 6 days supply | Qty: 21 | Fill #0

## 2020-07-21 NOTE — Progress Notes (Signed)
Office Visit Note   Patient: Martha Alvarado           Date of Birth: 1966-10-03           MRN: [id number] Visit Date: 07/21/2020              Requested by: Biagio Borg, MD [place],  [place] [address] PCP: Biagio Borg, MD   Assessment & Plan: Visit Diagnoses:  1. Chronic right-sided low back pain with right-sided sciatica     Plan: Martha Alvarado has had recurrence of her low back and right lower extremity pain similar to what she experienced a year ago.  She had an MRI scan in 2019 demonstrating a small focal soft disc protrusion at L5-S1 to the right compressing the S1 nerve.  She also had a small central disc protrusion of broad-based disc bulge at L4-5 with slight compression of the thecal sac without neural impingement.  In addition there was right foraminal and extraforaminal small disc protrusion at L3-4 with a slight mass-effect upon the the right L3 nerve lateral to the neural foramen.  She is not had any bowel or bladder changes and no trouble with the left lower extremity.  Neurologically she appears to be intact.  Will try a Medrol Dosepak and have her follow-up with Dr. Ernestina Patches for another epidural steroid injection  Follow-Up Instructions: Return if symptoms worsen or fail to improve.   Orders:  No orders of the defined types were placed in this encounter.  No orders of the defined types were placed in this encounter.     Procedures: No procedures performed   Clinical Data: No additional findings.   Subjective: Chief Complaint  Patient presents with   Lower Back - Pain  Recurrent low back pain and with right lower extremity radiculopathy.  This is a very similar pain to what she experienced a year ago and even a year before that.  She had an MRI scan in 2019 with the results above.  She has had excellent relief with epidural steroid injection per Dr. Ernestina Patches.  No bowel or bladder changes.  No left lower extremity radiculopathy and no obvious  neurologic deficits  HPI  Review of Systems   Objective: Vital Signs: There were no vitals taken for this visit.  Physical Exam Constitutional:      Appearance: She is well-developed.  Eyes:     Pupils: Pupils are equal, round, and reactive to light.  Pulmonary:     Effort: Pulmonary effort is normal.  Skin:    General: Skin is warm and dry.  Neurological:     Mental Status: She is alert and oriented to person, place, and time.  Psychiatric:        Behavior: Behavior normal.     Ortho Exam awake alert and oriented x3.  Comfortable sitting and walking.  No acute distress.  Straight leg raise negative on the left.  Minimally positive on the right at about 80 degrees for the posterior thigh pain.  Reflexes were depressed but symmetrical.  Motor and sensory exam intact.  No tenderness about the right hip and no percussible tenderness of the lumbar spine  Specialty Comments:  No specialty comments available.  Imaging: No results found.   PMFS History: Patient Active Problem List   Diagnosis Date Noted   Right carpal tunnel syndrome 06/08/2020   Low back pain with right-sided sciatica 06/08/2020   Vitamin D deficiency 06/08/2020   Right lumbar radiculopathy  05/27/2018   Acute right-sided low back pain with right-sided sciatica 01/22/2018   Hyperlipidemia 05/20/2015   Menopause syndrome 05/08/2012   Early menopause 05/01/2012   Encounter for well adult exam with abnormal findings 07/09/2011   Impaired glucose tolerance 07/08/2011   SYPHILIS 09/13/2007   OBESITY NOS 09/13/2007   Essential hypertension 09/13/2007   CHLAMYDIAL INFECTION, HX OF 09/13/2007   Past Medical History:  Diagnosis Date   CHLAMYDIAL INFECTION, HX OF 09/13/2007   Hyperlipidemia 05/20/2015   HYPERTENSION 09/13/2007   Impaired glucose tolerance 07/08/2011   SYPHILIS 09/13/2007    Family History  Problem Relation Age of Onset   Diabetes Mother    Diabetes Sister     Hypertension Sister    Hypertension Sister    Colon cancer Neg Hx    Esophageal cancer Neg Hx    Rectal cancer Neg Hx    Stomach cancer Neg Hx     Past Surgical History:  Procedure Laterality Date   TUBAL LIGATION  1991   Social History   Occupational History   Not on file  Tobacco Use   Smoking status: Never Smoker   Smokeless tobacco: Never Used  Scientific laboratory technician Use: Never used  Substance and Sexual Activity   Alcohol use: No   Drug use: No   Sexual activity: Yes    Birth control/protection: Post-menopausal, Surgical    Comment: 1st intercourse 54 yo-Fewer than 5 partners-BTL     Garald Balding, MD   Note - This record has been created using Editor, commissioning.  Chart creation errors have been sought, but may not always  have been located. Such creation errors do not reflect on  the standard of medical care.

## 2020-07-27 ENCOUNTER — Encounter: Payer: Self-pay | Admitting: Orthopaedic Surgery

## 2020-07-27 NOTE — Telephone Encounter (Signed)
Please cal and check on referral to Dr Ernestina Patches for the [facility]. Also can try tramadol 50mg  #30 1-2 tabs po bid prn-thanks

## 2020-07-28 MED ORDER — METHOCARBAMOL 500 MG PO TABS
500.0000 mg | ORAL_TABLET | Freq: Two times a day (BID) | ORAL | 1 refills | Status: DC
Start: 2020-07-28 — End: 2020-10-10

## 2020-07-28 MED FILL — METHOCARBAMOL 500 MG TABS: 500 | 15 days supply | Qty: 30 | Fill #0

## 2020-07-28 NOTE — Addendum Note (Signed)
Addended by: Robyne Peers on: 07/28/2020 11:28 AM   Modules accepted: Orders

## 2020-07-29 ENCOUNTER — Ambulatory Visit (INDEPENDENT_AMBULATORY_CARE_PROVIDER_SITE_OTHER): Payer: 59 | Admitting: Obstetrics and Gynecology

## 2020-07-29 ENCOUNTER — Other Ambulatory Visit: Payer: Self-pay

## 2020-07-29 ENCOUNTER — Encounter: Payer: Self-pay | Admitting: Obstetrics and Gynecology

## 2020-07-29 VITALS — BP 124/82 | Ht 66.0 in | Wt 230.0 lb

## 2020-07-29 DIAGNOSIS — Z01419 Encounter for gynecological examination (general) (routine) without abnormal findings: Secondary | ICD-10-CM

## 2020-07-29 NOTE — Progress Notes (Signed)
Martha Alvarado Mar 07, 1966 [id number]  SUBJECTIVE:  54 y.o. G2P2002 female for annual routine gynecologic exam. She has no gynecologic concerns.  Current Outpatient Medications  Medication Sig Dispense Refill  . aspirin 81 MG tablet Take 81 mg by mouth daily.      Marland Kitchen gabapentin (NEURONTIN) 300 MG capsule Take 1 capsule (300 mg total) by mouth 3 (three) times daily. 90 capsule 1  . lidocaine (LIDODERM) 5 % PLACE 1 PATCH TO AREA OF DISCOMFORT ON THE SKIN DAILY. REMOVE & DISCARD WITHIN 12 HRS OF PLACEMENT 30 patch 0  . methocarbamol (ROBAXIN) 500 MG tablet Take 1 tablet (500 mg total) by mouth every 8 (eight) hours as needed. 60 tablet 2  . methocarbamol (ROBAXIN) 500 MG tablet Take 1 tablet (500 mg total) by mouth 2 (two) times daily. 30 tablet 1  . Olmesartan-amLODIPine-HCTZ 40-5-12.5 MG TABS Take 1 tablet by mouth daily. 90 tablet 3  . potassium chloride (K-DUR) 10 MEQ tablet Take 2 tablets (20 mEq total) by mouth daily. 180 tablet 3  . rosuvastatin (CRESTOR) 10 MG tablet Take 1 tablet (10 mg total) by mouth daily. 90 tablet 3  . Vitamin D, Ergocalciferol, (DRISDOL) 1.25 MG (50000 UNIT) CAPS capsule Take 1 capsule (50,000 Units total) by mouth every 7 (seven) days. 12 capsule 0   No current facility-administered medications for this visit.   Allergies: Patient has no known allergies.  No LMP recorded. Patient is postmenopausal.  Past medical history,surgical history, problem list, medications, allergies, family history and social history were all reviewed and documented as reviewed in the EPIC chart.  ROS:  Feeling well. No dyspnea or chest pain on exertion.  No abdominal pain, change in bowel habits, black or bloody stools.  No urinary tract symptoms. GYN ROS: no abnormal bleeding, pelvic pain or discharge, no breast pain or new or enlarging lumps on self exam.No neurological complaints.   OBJECTIVE:  BP 124/82   Ht 5\' 6"  (1.676 m)   Wt (!) 230 lb (104.3 kg)   BMI 37.12 kg/m  The  patient appears well, alert, oriented x 3, in no distress. ENT normal.  Neck supple. No cervical or supraclavicular adenopathy or thyromegaly.  Lungs are clear, good air entry, no wheezes, rhonchi or rales. S1 and S2 normal, no murmurs, regular rate and rhythm.  Abdomen soft without tenderness, guarding, mass or organomegaly.  Neurological is normal, no focal findings.  BREAST EXAM: breasts appear normal, no suspicious masses, no skin or nipple changes or axillary nodes  PELVIC EXAM: VULVA: normal appearing vulva with no masses, tenderness or lesions, mild atrophic changes, VAGINA: normal appearing vagina with normal color and discharge, no lesions, CERVIX: normal appearing cervix without discharge or lesions, UTERUS: uterus is normal size, shape, consistency and nontender, ADNEXA: normal adnexa in size, nontender and no masses  Chaperone: Caryn Bee present during the examination  ASSESSMENT:  54 y.o. [id number] here for annual gynecologic exam  PLAN:   1. Postmenopausal.  No significant menopausal symptoms.  No vaginal bleeding. 2. Pap smear/HPV 2019.  No history of abnormal Pap smears.  Next Pap smear due 2024 following the current guidelines recommending the 5 year interval. 3. Mammogram 09/2019.  Normal breast exam today.  She is reminded to schedule an annual mammogram this year when due. 4. Colonoscopy 2018.  Recommended that she follow up at the recommended interval. 5. DEXA never.  Will be plan when further into menopause. 6. Health maintenance.  No labs today as she normally has  these completed elsewhere.  Return annually or sooner, prn.  Joseph Pierini MD 07/29/20

## 2020-08-01 ENCOUNTER — Encounter: Payer: Self-pay | Admitting: Internal Medicine

## 2020-08-02 ENCOUNTER — Other Ambulatory Visit: Payer: Self-pay

## 2020-08-02 MED ORDER — ROSUVASTATIN CALCIUM 10 MG PO TABS: 10.0000 mg | ORAL_TABLET | Freq: Every day | ORAL | 3 refills | Status: DC

## 2020-08-08 ENCOUNTER — Ambulatory Visit: Payer: 59 | Admitting: Physical Medicine and Rehabilitation

## 2020-08-08 ENCOUNTER — Other Ambulatory Visit: Payer: Self-pay

## 2020-08-08 ENCOUNTER — Ambulatory Visit: Payer: Self-pay

## 2020-08-08 VITALS — BP 147/91 | HR 70

## 2020-08-08 DIAGNOSIS — M5416 Radiculopathy, lumbar region: Secondary | ICD-10-CM

## 2020-08-08 MED ORDER — METHYLPREDNISOLONE ACETATE 80 MG/ML IJ SUSP
80.0000 mg | Freq: Once | INTRAMUSCULAR | Status: AC
Start: 2020-08-08 — End: 2020-08-08
  Administered 2020-08-08: 80 mg

## 2020-08-08 NOTE — Progress Notes (Signed)
Pt states lower back pain that travels to her right posterior leg. Pt states walking makes the pain worse. Pt states pain cream helps a little.  Numeric Pain Rating Scale and Functional Assessment Average Pain 10   In the last MONTH (on 0-10 scale) has pain interfered with the following?  1. General activity like being  able to carry out your everyday physical activities such as walking, climbing stairs, carrying groceries, or moving a chair?  Rating(10)   +Driver, -BT, -Dye Allergies.

## 2020-08-22 ENCOUNTER — Encounter: Payer: Self-pay | Admitting: Internal Medicine

## 2020-08-22 MED ORDER — ROSUVASTATIN CALCIUM 10 MG PO TABS: 10.0000 mg | ORAL_TABLET | Freq: Every day | ORAL | 3 refills | Status: DC

## 2020-08-22 MED ORDER — OLMESARTAN-AMLODIPINE-HCTZ 40-5-12.5 MG PO TABS: 1.0000 | ORAL_TABLET | Freq: Every day | ORAL | 2 refills | Status: DC

## 2020-08-22 MED FILL — ROSUVASTATIN CALCIUM 10 MG: 10 | 90 days supply | Qty: 90 | Fill #0

## 2020-08-22 MED FILL — OLMSRTN-AMLDPN-HCTZ 40-5-12: 40-5-12.5 | 90 days supply | Qty: 90 | Fill #0

## 2020-08-22 NOTE — Telephone Encounter (Signed)
Check [place] registry last filled Tramadol 06/25/2019. pls advise if ok refill MD is out of the office this week.Marland KitchenJohny Alvarado

## 2020-08-23 NOTE — Procedures (Signed)
Lumbar Epidural Steroid Injection - Interlaminar Approach with Fluoroscopic Guidance  Patient: Martha Alvarado      Date of Birth: 02/15/1966 MRN: [id number] PCP: Biagio Borg, MD      Visit Date: 08/08/2020   Universal Protocol:     Consent Given By: the patient  Position: PRONE  Additional Comments: Vital signs were monitored before and after the procedure. Patient was prepped and draped in the usual sterile fashion. The correct patient, procedure, and site was verified.   Injection Procedure Details:  Procedure Site One Meds Administered:  Meds ordered this encounter  Medications  . methylPREDNISolone acetate (DEPO-MEDROL) injection 80 mg     Laterality: Right  Location/Site:  L4-L5  Needle size: 20 G  Needle type: Tuohy  Needle Placement: Paramedian epidural  Findings:   -Comments: Excellent flow of contrast into the epidural space.  Procedure Details: Using a paramedian approach from the side mentioned above, the region overlying the inferior lamina was localized under fluoroscopic visualization and the soft tissues overlying this structure were infiltrated with 4 ml. of 1% Lidocaine without Epinephrine. The Tuohy needle was inserted into the epidural space using a paramedian approach.   The epidural space was localized using loss of resistance along with lateral and bi-planar fluoroscopic views.  After negative aspirate for air, blood, and CSF, a 2 ml. volume of Isovue-250 was injected into the epidural space and the flow of contrast was observed. Radiographs were obtained for documentation purposes.    The injectate was administered into the level noted above.   Additional Comments:  The patient tolerated the procedure well Dressing: 2 x 2 sterile gauze and Band-Aid    Post-procedure details: Patient was observed during the procedure. Post-procedure instructions were reviewed.  Patient left the clinic in stable condition.

## 2020-08-23 NOTE — Progress Notes (Signed)
Martha Alvarado - 54 y.o. female MRN [id number]  Date of birth: [birth year]  Office Visit Note: Visit Date: 08/08/2020 PCP: Biagio Borg, MD Referred by: Biagio Borg, MD  Subjective: Chief Complaint  Patient presents with  . Lower Back - Pain, Numbness  . Right Leg - Pain, Numbness   HPI:  Martha Alvarado is a 54 y.o. female who comes in today at the request of Dr. Joni Fears for planned Right L5-S1 Lumbar epidural steroid injection with fluoroscopic guidance.  The patient has failed conservative care including home exercise, medications, time and activity modification.  This injection will be diagnostic and hopefully therapeutic.  Please see requesting physician notes for further details and justification.  MRI reviewed with images and spine model.  MRI reviewed in the note below.   ROS Otherwise per HPI.  Assessment & Plan: Visit Diagnoses:  1. Lumbar radiculopathy     Plan: No additional findings.   Meds & Orders:  Meds ordered this encounter  Medications  . methylPREDNISolone acetate (DEPO-MEDROL) injection 80 mg    Orders Placed This Encounter  Procedures  . XR C-ARM NO REPORT  . Epidural Steroid injection    Follow-up: Return for visit to requesting physician as needed.   Procedures: No procedures performed  Lumbar Epidural Steroid Injection - Interlaminar Approach with Fluoroscopic Guidance  Patient: Martha Alvarado      Date of Birth: 1966/08/23 MRN: [id number] PCP: Biagio Borg, MD      Visit Date: 08/08/2020   Universal Protocol:     Consent Given By: the patient  Position: PRONE  Additional Comments: Vital signs were monitored before and after the procedure. Patient was prepped and draped in the usual sterile fashion. The correct patient, procedure, and site was verified.   Injection Procedure Details:  Procedure Site One Meds Administered:  Meds ordered this encounter  Medications  . methylPREDNISolone acetate (DEPO-MEDROL)  injection 80 mg     Laterality: Right  Location/Site:  L4-L5  Needle size: 20 G  Needle type: Tuohy  Needle Placement: Paramedian epidural  Findings:   -Comments: Excellent flow of contrast into the epidural space.  Procedure Details: Using a paramedian approach from the side mentioned above, the region overlying the inferior lamina was localized under fluoroscopic visualization and the soft tissues overlying this structure were infiltrated with 4 ml. of 1% Lidocaine without Epinephrine. The Tuohy needle was inserted into the epidural space using a paramedian approach.   The epidural space was localized using loss of resistance along with lateral and bi-planar fluoroscopic views.  After negative aspirate for air, blood, and CSF, a 2 ml. volume of Isovue-250 was injected into the epidural space and the flow of contrast was observed. Radiographs were obtained for documentation purposes.    The injectate was administered into the level noted above.   Additional Comments:  The patient tolerated the procedure well Dressing: 2 x 2 sterile gauze and Band-Aid    Post-procedure details: Patient was observed during the procedure. Post-procedure instructions were reviewed.  Patient left the clinic in stable condition.    Clinical History: Low back pain and posterior right leg pain since January 2019. Lumbar radiculopathy.  EXAM: MRI LUMBAR SPINE WITHOUT CONTRAST  TECHNIQUE: Multiplanar, multisequence MR imaging of the lumbar spine was performed. No intravenous contrast was administered.  COMPARISON:  Radiographs dated 01/15/2018  FINDINGS: Segmentation:  Standard.  Alignment:  Physiologic.  Vertebrae:  No fracture, evidence of discitis, or bone lesion.  Conus  medullaris and cauda equina: Conus extends to the T12-L1 level. Conus and cauda equina appear normal.  Paraspinal and other soft tissues: Negative.  Disc levels:  L1-2: Normal.  L2-3: Tiny  central disc bulge with no neural impingement.  L3-4: Slight disc degeneration. Small broad-based disc bulge with a small protrusion into the right neural foramen. This has a slight mass effect upon the right L3 nerve lateral to the neural foramen best seen on images 20 of series 6 and series 7. Slight hypertrophy of the facet joints and ligamentum flavum.  L4-5: Small broad-based disc bulge with a small central disc protrusion with slightly compresses the ventral aspect of the thecal sac without focal neural impingement. Slight hypertrophy of the facet joints and ligamentum flavum.  L5-S1: Small focal soft disc protrusion to the right of midline compressing the right S1 nerve, best seen on images 32 of series 6 and series 7 and image 6 of series 3. Facet joints appear normal.  IMPRESSION: 1. Small focal soft disc protrusion at L5-S1 to the right compressing the right S1 nerve. 2. Small central disc protrusion and broad-based disc bulge at L4-5 with slight compression of the thecal sac without focal neural impingement. 3. Right foraminal and extraforaminal small disc protrusion at L3-4 with a slight mass effect upon the right L3 nerve lateral to the neural foramen.   Electronically Signed   By: Lorriane Shire M.D.   On: 06/11/2018 13:53     Objective:  VS:  HT:    WT:   BMI:     BP:(!) 147/91  HR:70bpm  TEMP: ( )  RESP:  Physical Exam Constitutional:      General: She is not in acute distress.    Appearance: Normal appearance. She is obese. She is not ill-appearing.  HENT:     Head: Normocephalic and atraumatic.     Right Ear: External ear normal.     Left Ear: External ear normal.  Eyes:     Extraocular Movements: Extraocular movements intact.  Cardiovascular:     Rate and Rhythm: Normal rate.     Pulses: Normal pulses.  Musculoskeletal:     Right lower leg: No edema.     Left lower leg: No edema.     Comments: Patient has good distal strength with no  pain over the greater trochanters.  No clonus or focal weakness.  Skin:    Findings: No erythema, lesion or rash.  Neurological:     General: No focal deficit present.     Mental Status: She is alert and oriented to person, place, and time.     Sensory: No sensory deficit.     Motor: No weakness or abnormal muscle tone.     Coordination: Coordination normal.  Psychiatric:        Mood and Affect: Mood normal.        Behavior: Behavior normal.      Imaging: No results found.

## 2020-08-28 ENCOUNTER — Encounter: Payer: Self-pay | Admitting: Orthopaedic Surgery

## 2020-08-29 ENCOUNTER — Other Ambulatory Visit: Payer: Self-pay

## 2020-08-29 DIAGNOSIS — M5441 Lumbago with sciatica, right side: Secondary | ICD-10-CM

## 2020-08-29 NOTE — Telephone Encounter (Signed)
Needs repeat MRI of lumbar spine

## 2020-09-14 ENCOUNTER — Ambulatory Visit
Admission: RE | Admit: 2020-09-14 | Discharge: 2020-09-14 | Disposition: A | Discharge status: Home / Self Care | Payer: 59 | Source: Ambulatory Visit | Attending: Orthopaedic Surgery | Admitting: Orthopaedic Surgery

## 2020-09-14 ENCOUNTER — Other Ambulatory Visit: Payer: Self-pay

## 2020-09-14 DIAGNOSIS — M5441 Lumbago with sciatica, right side: Secondary | ICD-10-CM

## 2020-09-14 DIAGNOSIS — M48061 Spinal stenosis, lumbar region without neurogenic claudication: Secondary | ICD-10-CM | POA: Diagnosis not present

## 2020-09-14 NOTE — Progress Notes (Signed)
Please see above

## 2020-09-15 ENCOUNTER — Other Ambulatory Visit: Payer: Self-pay

## 2020-09-15 DIAGNOSIS — M5441 Lumbago with sciatica, right side: Secondary | ICD-10-CM

## 2020-09-23 ENCOUNTER — Ambulatory Visit: Payer: 59 | Admitting: Orthopaedic Surgery

## 2020-09-23 VITALS — BP 137/89 | HR 75 | Ht 64.0 in | Wt 230.0 lb

## 2020-09-23 DIAGNOSIS — M5416 Radiculopathy, lumbar region: Secondary | ICD-10-CM

## 2020-09-23 NOTE — Progress Notes (Signed)
Office Visit Note   Patient: Martha Alvarado           Date of Birth: April 03, 1966           MRN: [id number] Visit Date: 09/23/2020              Requested by: Garald Balding, MD [address] [place],  [place] [address] PCP: Biagio Borg, MD   Assessment & Plan: Visit Diagnoses:  1. Right lumbar radiculopathy     Plan: MRI scan was reviewed with the patient.  She has right S1 radiculopathy with enlargement and disc herniation now with nerve compression.  We reviewed the scan she has large disc protrusion with nerve root displacement and compression.  We discussed microdiscectomy overnight stay.  We discussed avoiding bending turning twisting stooping postoperatively.  Usual length of time out of work is approximately 6 weeks.  We discussed risks of recurrent disc herniation.  Possibility of problems with other levels where she has some disc degeneration adjacent levels without compression.  Questions elicited and answered she understands request to proceed.  Follow-Up Instructions: No follow-ups on file.   Orders:  No orders of the defined types were placed in this encounter.  No orders of the defined types were placed in this encounter.     Procedures: No procedures performed   Clinical Data: No additional findings.   Subjective: Chief Complaint  Patient presents with  . Lower Back - Pain    HPI 54 year old female [employer] employee with low back pain and right leg pain failed prednisone Dosepak epidural steroids with MRI scan 09/14/2020 shows significant increase and right L5-S1 HNP with nerve compression of S1 nerve root.  Patient's had back pain buttocks pain lateral foot numbness.  She has been treated with gabapentin, tramadol, Robaxin without relief.  Epidural steroid injection 08/08/2020.  Previous lumbar MRI June 2019 showed small disc protrusion on the right at L5 is 1 which is significantly enlarged in size on current scan.  Patient states she cannot stand the pain  and wants to proceed with operative intervention so she can continue working.  Review of Systems 14 point systems positive for hyperlipidemia impaired glucose tolerance, hypertension.  History of carpal tunnel syndrome and obesity.  All other systems are negative.   Objective: Vital Signs: BP 137/89   Pulse 75   Ht 5\' 4"  (1.626 m)   Wt 230 lb (104.3 kg)   BMI 39.48 kg/m   Physical Exam Constitutional:      Appearance: She is well-developed.  HENT:     Head: Normocephalic.     Right Ear: External ear normal.     Left Ear: External ear normal.  Eyes:     Pupils: Pupils are equal, round, and reactive to light.  Neck:     Thyroid: No thyromegaly.     Trachea: No tracheal deviation.  Cardiovascular:     Rate and Rhythm: Normal rate.  Pulmonary:     Effort: Pulmonary effort is normal.  Abdominal:     Palpations: Abdomen is soft.  Skin:    General: Skin is warm and dry.  Neurological:     Mental Status: She is alert and oriented to person, place, and time.  Psychiatric:        Behavior: Behavior normal.     Ortho Exam patient has pain with straight leg raising on the right at 50 degrees.  Still weakness gastrocsoleus with toe raises single stance.  She is able to walk  some with toes and heels but fatigues out on the right with toe raises.  Peroneal one half grade weak gastrocsoleus is one half grade weak on the right normal on the left.  EHL anterior tib is strong right and left.  Distal pulses are intact.  No quad weakness.  Positive popliteal compression test on the right negative on the left.  Specialty Comments:  No specialty comments available.  Imaging: CLINICAL DATA:  Low back pain radiating to the right lower extremity over the last 3 weeks.  EXAM: MRI LUMBAR SPINE WITHOUT CONTRAST  TECHNIQUE: Multiplanar, multisequence MR imaging of the lumbar spine was performed. No intravenous contrast was administered.  COMPARISON:   06/11/2018  FINDINGS: Segmentation:  5 lumbar type vertebral bodies.  Alignment:  Normal  Vertebrae:  No fracture or primary bone lesion.  Conus medullaris and cauda equina: Conus extends to the L1 level. Conus and cauda equina appear normal.  Paraspinal and other soft tissues: Negative  Disc levels:  No abnormality at L1-2 or above.  L2-3: Minimal noncompressive disc bulge.  L3-4: Moderate disc bulge somewhat more prominent towards the right. Stenosis of both lateral recesses. Bilateral foraminal/extraforaminal narrowing right more than left. Some potential for neural compression at this level, particularly in the right extraforaminal region. Similar in appearance to the previous study.  L4-5: Moderate disc bulge. Mild facet and ligamentous prominence. Stenosis of both lateral recesses which could possibly cause neural compression. Similar appearance to the previous study.  L5-S1: Right paracentral disc herniation with displacement and probable compression of the right S1 nerve. This has worsened since the previous study.  IMPRESSION: 1. Worsening of a right paracentral disc herniation at L5-S1 with displacement and probable compression of the right S1 nerve. 2. L3-4: Disc bulge more prominent towards the right. Stenosis of both lateral recesses. Foraminal/extraforaminal narrowing right more than left. Some potential for neural compression at this level, particularly in the right extraforaminal region. Similar appearance to the previous study. 3. L4-5: Moderate disc bulge. Mild facet and ligamentous prominence. Mild stenosis of both lateral recesses. Similar appearance to the previous study.   Electronically Signed   By: Nelson Chimes M.D.   On: 09/14/2020 15:35   PMFS History: Patient Active Problem List   Diagnosis Date Noted  . Right carpal tunnel syndrome 06/08/2020  . Low back pain with right-sided sciatica 06/08/2020  . Vitamin D  deficiency 06/08/2020  . Right lumbar radiculopathy 05/27/2018  . Acute right-sided low back pain with right-sided sciatica 01/22/2018  . Hyperlipidemia 05/20/2015  . 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 Early menopause 05/01/2012  . Encounter for well adult exam with abnormal findings 07/09/2011  . Impaired glucose tolerance 07/08/2011  . SYPHILIS 09/13/2007  . OBESITY NOS 09/13/2007  . Essential hypertension 09/13/2007  . CHLAMYDIAL INFECTION, HX OF 09/13/2007   Past Medical History:  Diagnosis Date  . CHLAMYDIAL INFECTION, HX OF 09/13/2007  . Hyperlipidemia 05/20/2015  . HYPERTENSION 09/13/2007  . Impaired glucose tolerance 07/08/2011  . SYPHILIS 09/13/2007    Family History  Problem Relation Age of Onset  . Diabetes Mother   . Diabetes Sister   . Hypertension Sister   . Hypertension Sister   . Colon cancer Neg Hx   . Esophageal cancer Neg Hx   . Rectal cancer Neg Hx   . Stomach cancer Neg Hx     Past Surgical History:  Procedure Laterality Date  . TUBAL LIGATION  1991   Social History   Occupational History  . Not  on file  Tobacco Use  . Smoking status: Never Smoker  . Smokeless tobacco: Never Used  Vaping Use  . Vaping Use: Never used  Substance and Sexual Activity  . Alcohol use: No  . Drug use: No  . Sexual activity: Yes    Birth control/protection: Post-menopausal, Surgical    Comment: 1st intercourse 54 yo-Fewer than 5 partners-BTL

## 2020-09-23 NOTE — H&P (Signed)
Martha Alvarado is an 54 y.o. female.   Chief Complaint: Low back pain and right lower extremity radiculopathy HPI: 54 year old black female history of right L5-S1 HNP, low back pain and right lower extremity radiculopathy comes in for preop evaluation.  She continues have ongoing symptoms.  Failed conservative treatment with lumbar ESI's.  She is want to proceed with right L5-S1 microdiscectomy is scheduled.  Past Medical History:  Diagnosis Date  . CHLAMYDIAL INFECTION, HX OF 09/13/2007  . Hyperlipidemia 05/20/2015  . HYPERTENSION 09/13/2007  . Impaired glucose tolerance 07/08/2011  . SYPHILIS 09/13/2007    Past Surgical History:  Procedure Laterality Date  . TUBAL LIGATION  1991    Family History  Problem Relation Age of Onset  . Diabetes Mother   . Diabetes Sister   . Hypertension Sister   . Hypertension Sister   . Colon cancer Neg Hx   . Esophageal cancer Neg Hx   . Rectal cancer Neg Hx   . Stomach cancer Neg Hx    Social History:  reports that she has never smoked. She has never used smokeless tobacco. She reports that she does not drink alcohol and does not use drugs.  Allergies: No Known Allergies  No medications prior to admission.    No results found for this or any previous visit (from the past 48 hour(s)). No results found.  Review of Systems  Constitutional: Positive for activity change.  HENT: Negative.   Respiratory: Negative.   Gastrointestinal: Negative.   Genitourinary: Negative.   Musculoskeletal: Positive for back pain and gait problem.  Neurological: Positive for numbness.  Psychiatric/Behavioral: Negative.     There were no vitals taken for this visit. Physical Exam HENT:     Head: Normocephalic and atraumatic.     Right Ear: Tympanic membrane normal.     Left Ear: Tympanic membrane normal.  Eyes:     Extraocular Movements: Extraocular movements intact.     Pupils: Pupils are equal, round, and reactive to light.  Pulmonary:     Effort:  Pulmonary effort is normal. No respiratory distress.     Breath sounds: Normal breath sounds.  Abdominal:     General: There is no distension.  Musculoskeletal:     Comments: Gait antalgic.  Positive right straight leg raise.  Neurovascular intact.  No focal motor deficits.  Skin:    General: Skin is warm and dry.  Neurological:     General: No focal deficit present.     Mental Status: She is alert and oriented to person, place, and time.      Assessment/Plan Right L5-S1 HNP.  We will proceed with right L5-S1 microdiscectomy scheduled.  Surgical procedure discussed in detail along with potential out of work.  I did give her a note for work anticipating that she will be out at least 6 to 8 weeks postop.  Patient employed as a Technical brewer at [employer].  All questions answered.  Benjiman Core, PA-C 09/23/2020, 3:46 PM

## 2020-09-27 ENCOUNTER — Other Ambulatory Visit: Payer: Self-pay

## 2020-09-27 NOTE — Progress Notes (Signed)
Your procedure is scheduled on Monday, October 11th.  Report to [facility] Main Entrance "A" at 10:30 A.M., and check in at the Admitting office.  Call this number if you have problems the morning of surgery:  [phone]  Call [phone] if you have any questions prior to your surgery date Monday-Friday 8am-4pm   Remember:  Do not eat after midnight the night before your surgery  You may drink clear liquids until 9:30 A.M. the morning of your surgery.   Clear liquids allowed are: Water, Non-Citrus Juices (without pulp), Carbonated Beverages, Clear Tea, Black Coffee Only, and Gatorade   Enhanced Recovery after Surgery for Orthopedics Enhanced Recovery after Surgery is a protocol used to improve the stress on your body and your recovery after surgery.  Patient Instructions  . The night before surgery:  o No food after midnight. ONLY clear liquids after midnight  .  Marland Kitchen The day of surgery (if you do NOT have diabetes):  o Drink ONE (1) Pre-Surgery Clear Ensure by 9:30 A.M. the morning of surgery o This drink was given to you during your hospital  pre-op appointment visit. o Nothing else to drink after completing the  Pre-Surgery Clear Ensure.         If you have questions, please contact your surgeon's office.   Take these medicines the morning of surgery with A SIP OF WATER  gabapentin (NEURONTIN)  rosuvastatin (CRESTOR)  If needed: methocarbamol (ROBAXIN), traMADol (ULTRAM)    Follow your surgeon's instructions on when to stop Aspirin.  If no instructions were given by your surgeon then you will need to call the office to get those instructions.    As of today, STOP taking  Aleve, Naproxen, Ibuprofen, Motrin, Advil, Goody's, BC's, all herbal medications, fish oil, and all vitamins.                     Do not wear jewelry, make up, or nail polish            Do not wear lotions, powders, perfumes, or deodorant.            Do not shave 48 hours prior to surgery.               Do not bring valuables to the hospital.            [facility] is not responsible for any belongings or valuables.  Do NOT Smoke (Tobacco/Vaping) or drink Alcohol 24 hours prior to your procedure If you use a CPAP at night, you may bring all equipment for your overnight stay.   Contacts, glasses, dentures or bridgework may not be worn into surgery.      For patients admitted to the hospital, discharge time will be determined by your treatment team.   Patients discharged the day of surgery will not be allowed to drive home, and someone needs to stay with them for 24 hours.  Special instructions:   [place]- Preparing For Surgery  Before surgery, you can play an important role. Because skin is not sterile, your skin needs to be as free of germs as possible. You can reduce the number of germs on your skin by washing with CHG (chlorahexidine gluconate) Soap before surgery.  CHG is an antiseptic cleaner which kills germs and bonds with the skin to continue killing germs even after washing.    Oral Hygiene is also important to reduce your risk of infection.  Remember - BRUSH YOUR TEETH THE  MORNING OF SURGERY WITH YOUR REGULAR TOOTHPASTE  Please do not use if you have an allergy to CHG or antibacterial soaps. If your skin becomes reddened/irritated stop using the CHG.  Do not shave (including legs and underarms) for at least 48 hours prior to first CHG shower. It is OK to shave your face.  Please follow these instructions carefully.   1. Shower the NIGHT BEFORE SURGERY and the MORNING OF SURGERY with CHG Soap.   2. If you chose to wash your hair, wash your hair first as usual with your normal shampoo.  3. After you shampoo, rinse your hair and body thoroughly to remove the shampoo.  4. Use CHG as you would any other liquid soap. You can apply CHG directly to the skin and wash gently with a scrungie or a clean washcloth.   5. Apply the CHG Soap to your body ONLY FROM THE NECK DOWN.  Do  not use on open wounds or open sores. Avoid contact with your eyes, ears, mouth and genitals (private parts). Wash Face and genitals (private parts)  with your normal soap.   6. Wash thoroughly, paying special attention to the area where your surgery will be performed.  7. Thoroughly rinse your body with warm water from the neck down.  8. DO NOT shower/wash with your normal soap after using and rinsing off the CHG Soap.  9. Pat yourself dry with a CLEAN TOWEL.  10. Wear CLEAN PAJAMAS to bed the night before surgery  11. Place CLEAN SHEETS on your bed the night of your first shower and DO NOT SLEEP WITH PETS.  Day of Surgery: Wear Clean/Comfortable clothing the morning of surgery Do not apply any deodorants/lotions.   Remember to brush your teeth WITH YOUR REGULAR TOOTHPASTE.   Please read over the following fact sheets that you were given.

## 2020-09-28 ENCOUNTER — Encounter (HOSPITAL_COMMUNITY): Payer: Self-pay

## 2020-09-28 ENCOUNTER — Encounter (HOSPITAL_COMMUNITY)
Admission: RE | Admit: 2020-09-28 | Discharge: 2020-09-28 | Disposition: A | Discharge status: Home / Self Care | Payer: 59 | Source: Ambulatory Visit | Attending: Orthopaedic Surgery | Admitting: Orthopaedic Surgery

## 2020-09-28 ENCOUNTER — Other Ambulatory Visit: Payer: Self-pay

## 2020-09-28 DIAGNOSIS — M5127 Other intervertebral disc displacement, lumbosacral region: Secondary | ICD-10-CM | POA: Diagnosis not present

## 2020-09-28 DIAGNOSIS — Z01818 Encounter for other preprocedural examination: Secondary | ICD-10-CM | POA: Insufficient documentation

## 2020-09-28 DIAGNOSIS — Z7982 Long term (current) use of aspirin: Secondary | ICD-10-CM | POA: Diagnosis not present

## 2020-09-28 DIAGNOSIS — E785 Hyperlipidemia, unspecified: Secondary | ICD-10-CM | POA: Insufficient documentation

## 2020-09-28 DIAGNOSIS — Z79899 Other long term (current) drug therapy: Secondary | ICD-10-CM | POA: Insufficient documentation

## 2020-09-28 DIAGNOSIS — I10 Essential (primary) hypertension: Secondary | ICD-10-CM | POA: Insufficient documentation

## 2020-09-28 LAB — COMPREHENSIVE METABOLIC PANEL
ALT: 31 U/L (ref 0–44)
AST: 27 U/L (ref 15–41)
Albumin: 3.8 g/dL (ref 3.5–5.0)
Alkaline Phosphatase: 81 U/L (ref 38–126)
Anion gap: 10 (ref 5–15)
BUN: 15 mg/dL (ref 6–20)
CO2: 27 mmol/L (ref 22–32)
Calcium: 9.3 mg/dL (ref 8.9–10.3)
Chloride: 100 mmol/L (ref 98–111)
Creatinine, Ser: 1.1 mg/dL — ABNORMAL HIGH (ref 0.44–1.00)
GFR calc Af Amer: >60 mL/min (ref >60)
GFR calc non Af Amer: 57 mL/min — ABNORMAL LOW (ref >60)
Glucose, Bld: 199 mg/dL — ABNORMAL HIGH (ref 70–99)
Potassium: 3.1 mmol/L — ABNORMAL LOW (ref 3.5–5.1)
Sodium: 137 mmol/L (ref 135–145)
Total Bilirubin: 0.7 mg/dL (ref 0.3–1.2)
Total Protein: 6.4 g/dL — ABNORMAL LOW (ref 6.5–8.1)

## 2020-09-28 LAB — CBC
HCT: 40.9 % (ref 36.0–46.0)
Hemoglobin: 12.9 g/dL (ref 12.0–15.0)
MCH: 27.3 pg (ref 26.0–34.0)
MCHC: 31.5 g/dL (ref 30.0–36.0)
MCV: 86.7 fL (ref 80.0–100.0)
Platelets: 246 10*3/uL (ref 150–400)
RBC: 4.72 MIL/uL (ref 3.87–5.11)
RDW: 12.9 % (ref 11.5–15.5)
WBC: 4.6 10*3/uL (ref 4.0–10.5)
nRBC: 0 % (ref 0.0–0.2)

## 2020-09-28 LAB — URINALYSIS, ROUTINE W REFLEX MICROSCOPIC
Bilirubin Urine: NEGATIVE
Glucose, UA: 150 mg/dL — AB
Hgb urine dipstick: NEGATIVE
Ketones, ur: NEGATIVE mg/dL
Leukocytes,Ua: NEGATIVE
Nitrite: NEGATIVE
Protein, ur: NEGATIVE mg/dL
Specific Gravity, Urine: 1.013 (ref 1.005–1.030)
pH: 5 (ref 5.0–8.0)

## 2020-09-28 LAB — SURGICAL PCR SCREEN
MRSA, PCR: NEGATIVE
Staphylococcus aureus: NEGATIVE

## 2020-09-28 NOTE — Progress Notes (Signed)
PCP - [name] Cardiologist - na   Chest x-ray - na EKG - 09/28/20 Stress Test - na ECHO - na Cardiac Cath - na  Sleep Study - na  Fasting Blood Sugar - na   Blood Thinner Instructions: Aspirin Instructions: pt. To contact Dr. Lorin Mercy  ERAS Protcol -yes PRE-SURGERY Ensure given  COVID TEST- 10/7   Anesthesia review:   Patient denies shortness of breath, fever, cough and chest pain at PAT appointment   All instructions explained to the patient, with a verbal understanding of the material. Patient agrees to go over the instructions while at home for a better understanding. Patient also instructed to self quarantine after being tested for COVID-19. The opportunity to ask questions was provided.

## 2020-09-29 NOTE — Progress Notes (Signed)
Anesthesia Chart Review:  Case: [id number] Date/Time: 10/10/20 1215   Procedure: RIGHT L5-S1 MICRODISCECTOMY LUMBAR LAMINECTOMY (N/A ) - REQUESTING RNFA   Anesthesia type: General   Pre-op diagnosis: L5-S1 herniated nucleus pulposus   Location: [place] / [place] OR   Surgeons: Marybelle Killings, MD      DISCUSSION: Patient is a 54 year old female scheduled for the above procedure.  History includes never smoker, HTN, HLD, impaired glucose tolerance (A1c 6.6% 06/08/20). BMI is consistent with obesity.  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  She denied shortness of breath, cough, fever, chest pain at PAT RN visit.  Presurgical COVID-19 test is scheduled for 10/06/2020.  Anesthesia team to evaluate on the day of surgery.   VS: BP 131/80 Comment: using large cuff  Pulse 64   Temp 36.4 C (Oral)   Resp 18   Ht 5\' 6"  (1.676 m)   Wt 104.6 kg   SpO2 100%   BMI 37.20 kg/m    PROVIDERS: Biagio Borg, MD is PCP.  Last visit 07/08/2020.  Hypertension and impaired glucose tolerance felt stable at that time. Joseph Pierini, MD is GYN   LABS: Labs reviewed: Acceptable for surgery. A1c 06/08/20 was 6.6%.  (all labs ordered are listed, but only abnormal results are displayed)  Labs Reviewed  COMPREHENSIVE METABOLIC PANEL - Abnormal; Notable for the following components:      Result Value   Potassium 3.1 (*)    Glucose, Bld 199 (*)    Creatinine, Ser 1.10 (*)    Total Protein 6.4 (*)    GFR calc non Af Amer 57 (*)    All other components within normal limits  URINALYSIS, ROUTINE W REFLEX MICROSCOPIC - Abnormal; Notable for the following components:   Glucose, UA 150 (*)    All other components within normal limits  SURGICAL PCR SCREEN  CBC     IMAGES: MRI L-spine 09/14/20: IMPRESSION: 1. Worsening of a right paracentral disc herniation at L5-S1 with displacement and probable compression of the right S1 nerve. 2. L3-4: Disc bulge more prominent towards the right. Stenosis  of both lateral recesses. Foraminal/extraforaminal narrowing right more than left. Some potential for neural compression at this level, particularly in the right extraforaminal region. Similar appearance to the previous study. 3. L4-5: Moderate disc bulge. Mild facet and ligamentous prominence. Mild stenosis of both lateral recesses. Similar appearance to the previous study.    EKG: 09/28/20: Normal sinus rhythm with sinus arrhythmia Minimal voltage criteria for LVH, may be normal variant ( R in aVL ) Nonspecific T wave abnormality Abnormal ECG No previous ECGs available Confirmed by Buford Dresser [phone]) on 09/28/2020 9:39:03 PM   CV: N/A   Past Medical History:  Diagnosis Date  . CHLAMYDIAL INFECTION, HX OF 09/13/2007  . Hyperlipidemia 05/20/2015  . HYPERTENSION 09/13/2007  . Impaired glucose tolerance 07/08/2011  . SYPHILIS 09/13/2007    Past Surgical History:  Procedure Laterality Date  . TUBAL LIGATION  1991    MEDICATIONS: . aspirin EC 81 MG tablet  . gabapentin (NEURONTIN) 300 MG capsule  . lidocaine (LIDODERM) 5 %  . methocarbamol (ROBAXIN) 500 MG tablet  . Olmesartan-amLODIPine-HCTZ 40-5-12.5 MG TABS  . potassium chloride (K-DUR) 10 MEQ tablet  . rosuvastatin (CRESTOR) 10 MG tablet  . traMADol (ULTRAM) 50 MG tablet  . Vitamin D, Ergocalciferol, (DRISDOL) 1.25 MG (50000 UNIT) CAPS capsule   No current facility-administered medications for this encounter.  Patient instructed to contact Dr.  Yates for perioperative ASA instructions.   Myra Gianotti, PA-C Surgical Short Stay/Anesthesiology [facility] Phone [phone] [facility] Phone [phone] 09/29/2020 3:54 PM

## 2020-09-29 NOTE — Anesthesia Preprocedure Evaluation (Addendum)
Anesthesia Evaluation  Patient identified by MRN, date of birth, ID band Patient awake    Reviewed: Patient's Chart, lab work & pertinent test results  Airway Mallampati: II  TM Distance: >3 FB Neck ROM: Full    Dental  (+) Chipped, Teeth Intact   Pulmonary neg pulmonary ROS,    Pulmonary exam normal        Cardiovascular hypertension, Pt. on medications  Rhythm:Regular Rate:Normal     Neuro/Psych negative neurological ROS  negative psych ROS   GI/Hepatic negative GI ROS, Neg liver ROS,   Endo/Other  negative endocrine ROS  Renal/GU negative Renal ROS  negative genitourinary   Musculoskeletal L5/S1 disc herniation   Abdominal (+)  Abdomen: soft. Bowel sounds: normal.  Peds  Hematology negative hematology ROS (+)   Anesthesia Other Findings   Reproductive/Obstetrics negative OB ROS                           Anesthesia Physical Anesthesia Plan  ASA: II  Anesthesia Plan: General   Post-op Pain Management:    Induction:   PONV Risk Score and Plan: 3 and Ondansetron and Dexamethasone  Airway Management Planned: Mask and Oral ETT  Additional Equipment: None  Intra-op Plan:   Post-operative Plan: Extubation in OR  Informed Consent: I have reviewed the patients History and Physical, chart, labs and discussed the procedure including the risks, benefits and alternatives for the proposed anesthesia with the patient or authorized representative who has indicated his/her understanding and acceptance.     Dental advisory given  Plan Discussed with: CRNA and Surgeon  Anesthesia Plan Comments: (PAT note written by Myra Gianotti, PA-C. Lab Results      Component                Value               Date                      WBC                      4.6                 09/28/2020                HGB                      12.9                09/28/2020                HCT                       40.9                09/28/2020                MCV                      86.7                09/28/2020                PLT                      246  09/28/2020          )       Anesthesia Quick Evaluation

## 2020-09-30 ENCOUNTER — Telehealth: Payer: Self-pay | Admitting: Orthopaedic Surgery

## 2020-09-30 NOTE — Telephone Encounter (Signed)
[employer] forms received. Sent to [employer]. 

## 2020-10-06 ENCOUNTER — Other Ambulatory Visit (HOSPITAL_COMMUNITY)
Admission: RE | Admit: 2020-10-06 | Discharge: 2020-10-06 | Disposition: A | Discharge status: Home / Self Care | Payer: 59 | Source: Ambulatory Visit | Attending: Orthopaedic Surgery | Admitting: Orthopaedic Surgery

## 2020-10-06 DIAGNOSIS — Z20822 Contact with and (suspected) exposure to covid-19: Secondary | ICD-10-CM | POA: Insufficient documentation

## 2020-10-06 DIAGNOSIS — Z01812 Encounter for preprocedural laboratory examination: Secondary | ICD-10-CM | POA: Diagnosis not present

## 2020-10-06 LAB — SARS CORONAVIRUS 2 (TAT 6-24 HRS): SARS Coronavirus 2: NEGATIVE

## 2020-10-07 ENCOUNTER — Other Ambulatory Visit: Payer: Self-pay

## 2020-10-10 ENCOUNTER — Ambulatory Visit (HOSPITAL_COMMUNITY): Payer: 59

## 2020-10-10 ENCOUNTER — Ambulatory Visit (HOSPITAL_COMMUNITY): Payer: 59 | Admitting: Vascular Surgery

## 2020-10-10 ENCOUNTER — Other Ambulatory Visit (HOSPITAL_COMMUNITY): Payer: Self-pay | Admitting: Surgery

## 2020-10-10 ENCOUNTER — Other Ambulatory Visit: Payer: Self-pay

## 2020-10-10 ENCOUNTER — Observation Stay (HOSPITAL_COMMUNITY)
Admission: RE | Admit: 2020-10-10 | Discharge: 2020-10-11 | Disposition: A | Discharge status: Home / Self Care | Payer: 59 | Source: Ambulatory Visit | Attending: Orthopaedic Surgery | Admitting: Orthopaedic Surgery

## 2020-10-10 ENCOUNTER — Encounter (HOSPITAL_COMMUNITY)
Admission: RE | Disposition: A | Discharge status: Home / Self Care | Payer: Self-pay | Source: Ambulatory Visit | Attending: Orthopaedic Surgery

## 2020-10-10 ENCOUNTER — Telehealth: Payer: Self-pay | Admitting: Orthopaedic Surgery

## 2020-10-10 ENCOUNTER — Encounter (HOSPITAL_COMMUNITY): Payer: Self-pay | Admitting: Orthopaedic Surgery

## 2020-10-10 ENCOUNTER — Ambulatory Visit (HOSPITAL_COMMUNITY): Payer: 59 | Admitting: Anesthesiology

## 2020-10-10 DIAGNOSIS — M5117 Intervertebral disc disorders with radiculopathy, lumbosacral region: Principal | ICD-10-CM | POA: Insufficient documentation

## 2020-10-10 DIAGNOSIS — M5116 Intervertebral disc disorders with radiculopathy, lumbar region: Secondary | ICD-10-CM | POA: Diagnosis not present

## 2020-10-10 DIAGNOSIS — Z23 Encounter for immunization: Secondary | ICD-10-CM | POA: Insufficient documentation

## 2020-10-10 DIAGNOSIS — M5416 Radiculopathy, lumbar region: Secondary | ICD-10-CM | POA: Diagnosis not present

## 2020-10-10 DIAGNOSIS — I10 Essential (primary) hypertension: Secondary | ICD-10-CM | POA: Diagnosis not present

## 2020-10-10 DIAGNOSIS — E785 Hyperlipidemia, unspecified: Secondary | ICD-10-CM | POA: Diagnosis not present

## 2020-10-10 DIAGNOSIS — M5126 Other intervertebral disc displacement, lumbar region: Secondary | ICD-10-CM | POA: Diagnosis present

## 2020-10-10 DIAGNOSIS — E559 Vitamin D deficiency, unspecified: Secondary | ICD-10-CM | POA: Diagnosis not present

## 2020-10-10 DIAGNOSIS — Z419 Encounter for procedure for purposes other than remedying health state, unspecified: Secondary | ICD-10-CM

## 2020-10-10 DIAGNOSIS — Z981 Arthrodesis status: Secondary | ICD-10-CM | POA: Diagnosis not present

## 2020-10-10 HISTORY — PX: LUMBAR LAMINECTOMY: SHX95

## 2020-10-10 SURGERY — MICRODISCECTOMY LUMBAR LAMINECTOMY
Anesthesia: General

## 2020-10-10 MED ORDER — CHLORHEXIDINE GLUCONATE 0.12 % MT SOLN: 15.0000 mL | Freq: Once | OROMUCOSAL | Status: AC

## 2020-10-10 MED ORDER — GABAPENTIN 300 MG PO CAPS
300.0000 mg | ORAL_CAPSULE | Freq: Three times a day (TID) | ORAL | Status: DC
  Administered 2020-10-10 (×2): 300 mg via ORAL
  Filled 2020-10-10 (×2): qty 1

## 2020-10-10 MED ORDER — HYDROMORPHONE HCL 1 MG/ML IJ SOLN: 0.5000 mg | INTRAMUSCULAR | Status: DC | PRN

## 2020-10-10 MED ORDER — OXYCODONE-ACETAMINOPHEN 5-325 MG PO TABS: 1.0000 | ORAL_TABLET | ORAL | 0 refills | Status: DC | PRN

## 2020-10-10 MED ORDER — SUGAMMADEX SODIUM 200 MG/2ML IV SOLN
INTRAVENOUS | Status: DC | PRN
  Administered 2020-10-10: 200 mg via INTRAVENOUS

## 2020-10-10 MED ORDER — ACETAMINOPHEN 10 MG/ML IV SOLN
INTRAVENOUS | Status: AC
  Filled 2020-10-10: qty 100

## 2020-10-10 MED ORDER — ONDANSETRON HCL 4 MG/2ML IJ SOLN: 4.0000 mg | Freq: Four times a day (QID) | INTRAMUSCULAR | Status: DC | PRN

## 2020-10-10 MED ORDER — ONDANSETRON HCL 4 MG/2ML IJ SOLN
INTRAMUSCULAR | Status: DC | PRN
  Administered 2020-10-10: 4 mg via INTRAVENOUS

## 2020-10-10 MED ORDER — ORAL CARE MOUTH RINSE: 15.0000 mL | Freq: Once | OROMUCOSAL | Status: AC

## 2020-10-10 MED ORDER — ONDANSETRON HCL 4 MG/2ML IJ SOLN: 4.0000 mg | Freq: Once | INTRAMUSCULAR | Status: DC | PRN

## 2020-10-10 MED ORDER — DEXAMETHASONE SODIUM PHOSPHATE 10 MG/ML IJ SOLN
INTRAMUSCULAR | Status: DC | PRN
  Administered 2020-10-10: 4 mg via INTRAVENOUS

## 2020-10-10 MED ORDER — INFLUENZA VAC SPLIT QUAD 0.5 ML IM SUSY
0.5000 mL | PREFILLED_SYRINGE | INTRAMUSCULAR | Status: AC
  Administered 2020-10-11: 0.5 mL via INTRAMUSCULAR
  Filled 2020-10-10: qty 0.5

## 2020-10-10 MED ORDER — OXYCODONE HCL 5 MG PO TABS
5.0000 mg | ORAL_TABLET | ORAL | Status: DC | PRN
  Administered 2020-10-10 – 2020-10-11 (×3): 5 mg via ORAL
  Filled 2020-10-10 (×3): qty 1

## 2020-10-10 MED ORDER — POTASSIUM CHLORIDE ER 10 MEQ PO TBCR
20.0000 meq | EXTENDED_RELEASE_TABLET | Freq: Every day | ORAL | Status: DC
  Filled 2020-10-10: qty 2

## 2020-10-10 MED ORDER — ROSUVASTATIN CALCIUM 5 MG PO TABS: 10.0000 mg | ORAL_TABLET | Freq: Every day | ORAL | Status: DC

## 2020-10-10 MED ORDER — METHOCARBAMOL 500 MG PO TABS: 500.0000 mg | ORAL_TABLET | Freq: Four times a day (QID) | ORAL | 1 refills | Status: DC | PRN

## 2020-10-10 MED ORDER — SODIUM CHLORIDE 0.9 % IV SOLN: INTRAVENOUS | Status: DC

## 2020-10-10 MED ORDER — SODIUM CHLORIDE 0.9% FLUSH
3.0000 mL | Freq: Two times a day (BID) | INTRAVENOUS | Status: DC
  Administered 2020-10-10: 3 mL via INTRAVENOUS

## 2020-10-10 MED ORDER — IRBESARTAN 150 MG PO TABS: 300.0000 mg | ORAL_TABLET | Freq: Every day | ORAL | Status: DC

## 2020-10-10 MED ORDER — ONDANSETRON HCL 4 MG PO TABS: 4.0000 mg | ORAL_TABLET | Freq: Four times a day (QID) | ORAL | Status: DC | PRN

## 2020-10-10 MED ORDER — BUPIVACAINE HCL (PF) 0.25 % IJ SOLN
INTRAMUSCULAR | Status: AC
  Filled 2020-10-10: qty 30

## 2020-10-10 MED ORDER — GLYCOPYRROLATE 0.2 MG/ML IJ SOLN
INTRAMUSCULAR | Status: DC | PRN
  Administered 2020-10-10 (×2): .1 mg via INTRAVENOUS

## 2020-10-10 MED ORDER — LACTATED RINGERS IV SOLN: INTRAVENOUS | Status: DC

## 2020-10-10 MED ORDER — HYDROCHLOROTHIAZIDE 12.5 MG PO CAPS: 12.5000 mg | ORAL_CAPSULE | Freq: Every day | ORAL | Status: DC

## 2020-10-10 MED ORDER — ACETAMINOPHEN 160 MG/5ML PO SOLN: 325.0000 mg | ORAL | Status: DC | PRN

## 2020-10-10 MED ORDER — CHLORHEXIDINE GLUCONATE 0.12 % MT SOLN
OROMUCOSAL | Status: AC
  Administered 2020-10-10: 15 mL via OROMUCOSAL
  Filled 2020-10-10: qty 15

## 2020-10-10 MED ORDER — OLMESARTAN-AMLODIPINE-HCTZ 40-5-12.5 MG PO TABS: 1.0000 | ORAL_TABLET | Freq: Every day | ORAL | Status: DC

## 2020-10-10 MED ORDER — METHOCARBAMOL 1000 MG/10ML IJ SOLN
500.0000 mg | Freq: Four times a day (QID) | INTRAVENOUS | Status: DC | PRN
  Filled 2020-10-10: qty 5

## 2020-10-10 MED ORDER — DOCUSATE SODIUM 100 MG PO CAPS
100.0000 mg | ORAL_CAPSULE | Freq: Two times a day (BID) | ORAL | Status: DC
  Administered 2020-10-10: 100 mg via ORAL
  Filled 2020-10-10: qty 1

## 2020-10-10 MED ORDER — ROCURONIUM BROMIDE 10 MG/ML (PF) SYRINGE
PREFILLED_SYRINGE | INTRAVENOUS | Status: DC | PRN
  Administered 2020-10-10: 60 mg via INTRAVENOUS

## 2020-10-10 MED ORDER — AMLODIPINE BESYLATE 5 MG PO TABS: 5.0000 mg | ORAL_TABLET | Freq: Every day | ORAL | Status: DC

## 2020-10-10 MED ORDER — POLYETHYLENE GLYCOL 3350 17 G PO PACK: 17.0000 g | PACK | Freq: Every day | ORAL | Status: DC | PRN

## 2020-10-10 MED ORDER — MIDAZOLAM HCL 2 MG/2ML IJ SOLN
INTRAMUSCULAR | Status: AC
  Filled 2020-10-10: qty 2

## 2020-10-10 MED ORDER — HYDROMORPHONE HCL 1 MG/ML IJ SOLN
INTRAMUSCULAR | Status: AC
Start: 2020-10-10 — End: 2020-10-11
  Filled 2020-10-10: qty 1

## 2020-10-10 MED ORDER — MIDAZOLAM HCL 5 MG/5ML IJ SOLN
INTRAMUSCULAR | Status: DC | PRN
  Administered 2020-10-10: 2 mg via INTRAVENOUS

## 2020-10-10 MED ORDER — 0.9 % SODIUM CHLORIDE (POUR BTL) OPTIME
TOPICAL | Status: DC | PRN
  Administered 2020-10-10: 1000 mL

## 2020-10-10 MED ORDER — LIDOCAINE 2% (20 MG/ML) 5 ML SYRINGE
INTRAMUSCULAR | Status: DC | PRN
  Administered 2020-10-10: 40 mg via INTRAVENOUS

## 2020-10-10 MED ORDER — FENTANYL CITRATE (PF) 100 MCG/2ML IJ SOLN
INTRAMUSCULAR | Status: DC | PRN
  Administered 2020-10-10: 50 ug via INTRAVENOUS
  Administered 2020-10-10: 100 ug via INTRAVENOUS
  Administered 2020-10-10: 50 ug via INTRAVENOUS

## 2020-10-10 MED ORDER — ACETAMINOPHEN 10 MG/ML IV SOLN
INTRAVENOUS | Status: DC | PRN
  Administered 2020-10-10: 1000 mg via INTRAVENOUS

## 2020-10-10 MED ORDER — SODIUM CHLORIDE 0.9% FLUSH: 3.0000 mL | INTRAVENOUS | Status: DC | PRN

## 2020-10-10 MED ORDER — CEFAZOLIN SODIUM-DEXTROSE 2-4 GM/100ML-% IV SOLN
2.0000 g | INTRAVENOUS | Status: AC
  Administered 2020-10-10: 2 g via INTRAVENOUS
  Filled 2020-10-10: qty 100

## 2020-10-10 MED ORDER — THROMBIN (RECOMBINANT) 5000 UNITS EX SOLR
CUTANEOUS | Status: AC
  Filled 2020-10-10: qty 5000

## 2020-10-10 MED ORDER — SODIUM CHLORIDE 0.9 % IV SOLN: 250.0000 mL | INTRAVENOUS | Status: DC

## 2020-10-10 MED ORDER — MENTHOL 3 MG MT LOZG: 1.0000 | LOZENGE | OROMUCOSAL | Status: DC | PRN

## 2020-10-10 MED ORDER — PROPOFOL 10 MG/ML IV BOLUS
INTRAVENOUS | Status: DC | PRN
  Administered 2020-10-10: 140 mg via INTRAVENOUS

## 2020-10-10 MED ORDER — ACETAMINOPHEN 325 MG PO TABS: 650.0000 mg | ORAL_TABLET | ORAL | Status: DC | PRN

## 2020-10-10 MED ORDER — METHOCARBAMOL 500 MG PO TABS
ORAL_TABLET | ORAL | Status: AC
  Filled 2020-10-10: qty 1

## 2020-10-10 MED ORDER — BUPIVACAINE HCL (PF) 0.25 % IJ SOLN
INTRAMUSCULAR | Status: DC | PRN
  Administered 2020-10-10: 10 mL

## 2020-10-10 MED ORDER — EPHEDRINE SULFATE-NACL 50-0.9 MG/10ML-% IV SOSY
PREFILLED_SYRINGE | INTRAVENOUS | Status: DC | PRN
  Administered 2020-10-10 (×2): 5 mg via INTRAVENOUS
  Administered 2020-10-10: 10 mg via INTRAVENOUS

## 2020-10-10 MED ORDER — DEXAMETHASONE SODIUM PHOSPHATE 10 MG/ML IJ SOLN
INTRAMUSCULAR | Status: AC
  Filled 2020-10-10: qty 1

## 2020-10-10 MED ORDER — HYDROMORPHONE HCL 1 MG/ML IJ SOLN
0.2500 mg | INTRAMUSCULAR | Status: DC | PRN
  Administered 2020-10-10: 0.25 mg via INTRAVENOUS

## 2020-10-10 MED ORDER — PHENYLEPHRINE HCL (PRESSORS) 10 MG/ML IV SOLN
INTRAVENOUS | Status: DC | PRN
  Administered 2020-10-10: 80 ug via INTRAVENOUS
  Administered 2020-10-10: 40 ug via INTRAVENOUS
  Administered 2020-10-10: 80 ug via INTRAVENOUS

## 2020-10-10 MED ORDER — ACETAMINOPHEN 325 MG PO TABS: 325.0000 mg | ORAL_TABLET | ORAL | Status: DC | PRN

## 2020-10-10 MED ORDER — ACETAMINOPHEN 650 MG RE SUPP: 650.0000 mg | RECTAL | Status: DC | PRN

## 2020-10-10 MED ORDER — GLYCOPYRROLATE PF 0.2 MG/ML IJ SOSY
PREFILLED_SYRINGE | INTRAMUSCULAR | Status: AC
  Filled 2020-10-10: qty 1

## 2020-10-10 MED ORDER — METHOCARBAMOL 500 MG PO TABS
500.0000 mg | ORAL_TABLET | Freq: Four times a day (QID) | ORAL | Status: DC | PRN
  Administered 2020-10-10: 500 mg via ORAL
  Filled 2020-10-10: qty 1

## 2020-10-10 MED ORDER — FENTANYL CITRATE (PF) 250 MCG/5ML IJ SOLN
INTRAMUSCULAR | Status: AC
  Filled 2020-10-10: qty 5

## 2020-10-10 MED ORDER — PHENOL 1.4 % MT LIQD: 1.0000 | OROMUCOSAL | Status: DC | PRN

## 2020-10-10 MED FILL — OXYCODONE-APAP 5-325MG: 5-325 | 8 days supply | Qty: 50 | Fill #0

## 2020-10-10 MED FILL — METHOCARBAMOL 500 MG TABS: 500 | 7 days supply | Qty: 30 | Fill #0

## 2020-10-10 SURGICAL SUPPLY — 42 items
ADH SKN CLS APL DERMABOND .7 (GAUZE/BANDAGES/DRESSINGS) ×1
BUR ROUND FLUTED 4 SOFT TCH (BURR) ×1 IMPLANT
BUR ROUND FLUTED 4MM SOFT TCH (BURR) ×1
CANISTER SUCT 3000ML PPV (MISCELLANEOUS) ×3 IMPLANT
COVER SURGICAL LIGHT HANDLE (MISCELLANEOUS) ×3 IMPLANT
COVER WAND RF STERILE (DRAPES) ×1 IMPLANT
DERMABOND ADVANCED (GAUZE/BANDAGES/DRESSINGS) ×2
DERMABOND ADVANCED .7 DNX12 (GAUZE/BANDAGES/DRESSINGS) ×1 IMPLANT
DRAPE MICROSCOPE LEICA (MISCELLANEOUS) ×3 IMPLANT
DRSG MEPILEX BORDER 4X4 (GAUZE/BANDAGES/DRESSINGS) IMPLANT
DRSG MEPILEX BORDER 4X8 (GAUZE/BANDAGES/DRESSINGS) ×2 IMPLANT
DURAPREP 26ML APPLICATOR (WOUND CARE) ×3 IMPLANT
DURASEAL SPINE SEALANT 3ML (MISCELLANEOUS) IMPLANT
ELECT REM PT RETURN 9FT ADLT (ELECTROSURGICAL) ×3
ELECTRODE REM PT RTRN 9FT ADLT (ELECTROSURGICAL) ×1 IMPLANT
GAUZE SPONGE 4X4 12PLY STRL (GAUZE/BANDAGES/DRESSINGS) ×3 IMPLANT
GLOVE BIOGEL PI IND STRL 8 (GLOVE) ×2 IMPLANT
GLOVE BIOGEL PI INDICATOR 8 (GLOVE) ×4
GLOVE ORTHO TXT STRL SZ7.5 (GLOVE) ×6 IMPLANT
GOWN STRL REUS W/ TWL LRG LVL3 (GOWN DISPOSABLE) ×1 IMPLANT
GOWN STRL REUS W/ TWL XL LVL3 (GOWN DISPOSABLE) ×1 IMPLANT
GOWN STRL REUS W/TWL 2XL LVL3 (GOWN DISPOSABLE) ×3 IMPLANT
GOWN STRL REUS W/TWL LRG LVL3 (GOWN DISPOSABLE) ×3
GOWN STRL REUS W/TWL XL LVL3 (GOWN DISPOSABLE) ×3
KIT BASIN OR (CUSTOM PROCEDURE TRAY) ×3 IMPLANT
KIT TURNOVER KIT B (KITS) ×3 IMPLANT
NDL SPNL 18GX3.5 QUINCKE PK (NEEDLE) ×1 IMPLANT
NEEDLE SPNL 18GX3.5 QUINCKE PK (NEEDLE) ×3 IMPLANT
NS IRRIG 1000ML POUR BTL (IV SOLUTION) ×3 IMPLANT
PACK LAMINECTOMY ORTHO (CUSTOM PROCEDURE TRAY) ×3 IMPLANT
PAD ARMBOARD 7.5X6 YLW CONV (MISCELLANEOUS) ×6 IMPLANT
PATTIES SURGICAL .5 X.5 (GAUZE/BANDAGES/DRESSINGS) IMPLANT
PATTIES SURGICAL .75X.75 (GAUZE/BANDAGES/DRESSINGS) IMPLANT
SUT VIC AB 1 CTX 36 (SUTURE) ×3
SUT VIC AB 1 CTX36XBRD ANBCTR (SUTURE) ×1 IMPLANT
SUT VIC AB 2-0 CT1 27 (SUTURE) ×3
SUT VIC AB 2-0 CT1 TAPERPNT 27 (SUTURE) ×1 IMPLANT
SUT VIC AB 3-0 X1 27 (SUTURE) IMPLANT
SYR 20ML ECCENTRIC (SYRINGE) IMPLANT
TOWEL GREEN STERILE (TOWEL DISPOSABLE) ×3 IMPLANT
TOWEL GREEN STERILE FF (TOWEL DISPOSABLE) ×3 IMPLANT
WATER STERILE IRR 1000ML POUR (IV SOLUTION) ×3 IMPLANT

## 2020-10-10 NOTE — Transfer of Care (Signed)
Immediate Anesthesia Transfer of Care Note  Patient: DAURICE OVANDO  Procedure(s) Performed: RIGHT L5-S1 MICRODISCECTOMY LUMBAR LAMINECTOMY (N/A )  Patient Location: PACU  Anesthesia Type:General  Level of Consciousness: awake and drowsy  Airway & Oxygen Therapy: Patient Spontanous Breathing and Patient connected to face mask oxygen  Post-op Assessment: Report given to RN and Post -op Vital signs reviewed and stable  Post vital signs: Reviewed and stable  Last Vitals:  Vitals Value Taken Time  BP 118/74 10/10/20 1445  Temp    Pulse 77 10/10/20 1447  Resp 19 10/10/20 1447  SpO2 99 % 10/10/20 1447  Vitals shown include unvalidated device data.  Last Pain:  Vitals:   10/10/20 1058  TempSrc:   PainSc: 5       Patients Stated Pain Goal: 3 (98/33/82 5053)  Complications: No complications documented.

## 2020-10-10 NOTE — Discharge Instructions (Addendum)
Ok to shower 1 days postop. No tub soaking.  Do not apply any creams or ointments to incision.  Do not remove steri-strips.  Can leave dressing on .   No aggressive activity.  No lifting, twisting, bending, squatting or prolonged sitting.  Mostly be in reclined position or lying down.  Walk daily.   No driving

## 2020-10-10 NOTE — Interval H&P Note (Signed)
History and Physical Interval Note:  10/10/2020 12:36 PM  Martha Alvarado  has presented today for surgery, with the diagnosis of L5-S1 herniated nucleus pulposus.  The various methods of treatment have been discussed with the patient and family. After consideration of risks, benefits and other options for treatment, the patient has consented to  Procedure(s): RIGHT L5-S1 MICRODISCECTOMY LUMBAR LAMINECTOMY (N/A) as a surgical intervention.  The patient's history has been reviewed, patient examined, no change in status, stable for surgery.  I have reviewed the patient's chart and labs.  Questions were answered to the patient's satisfaction.     Martha Alvarado

## 2020-10-10 NOTE — Op Note (Signed)
Preop diagnosis: Right L5-S1 disc protrusion with radiculopathy.  Postop diagnosis: Same  Procedure: Right L5-S1 microdiscectomy  Surgeon: Rodell Perna, MD  Assistant: Benjiman Core, PA-C medically necessary and present for the entire procedure  Anesthesia: General plus Marcaine skin local.  Findings: Large right L5-S1 disc protrusion with L5 nerve root compression and right S1 nerve root compression.  Procedure: After induction of general anesthesia patient was placed prone on chest rolls careful padding positioning calf pump was preop Ancef prophylaxis timeout procedure.  1010 drape was applied over the S2 level transversely back was prepped with DuraPrep the area squared with towels Betadine Steri-Drape after sterile skin marker.  Laminectomy sheet spinal needle placed at L5-S1 disc level confirmed with crosstable lateral plain radiograph.  Incision was made 2 mm to the right of midline subperiosteal dissection the deep Taylor retractor was initially placed with 5 pound weight.  During the case it popped out several times and this was switched to the [facility] retractor with the extra deep blades.  Laminotomy was performed with a 4 mm fluted bur.  Overhanging facets were trimmed back.  The chunks of ligament was divided.  Nerve root was dorsally displaced and laterally displaced with large disc protrusion right paracentral underneath.  Entered the foramina was enlarged bone was removed at the level of the pedicle annulus was incised and multiple chunks of disc material was removed decompressing the nerve root.  Lateral ligament was removed.  Epstein curettes were used inside the disc micropituitary up-biting micropituitary straight pituitaries were used.  Once decompression was performed hockey-stick was able to be passed anterior to the nerve root and dura without areas of compression.  Lateral epidural space was dry bipolar cautery was used.  Irrigation followed by layered closure #1 Vicryl in the  deep fascia.  Reapproximation adipose layer skin closure postop dressing and transferred recovery room.

## 2020-10-10 NOTE — Anesthesia Procedure Notes (Signed)
Procedure Name: Intubation Date/Time: 10/10/2020 12:55 PM Performed by: Inda Coke, CRNA Pre-anesthesia Checklist: Patient identified, Emergency Drugs available, Suction available and Patient being monitored Patient Re-evaluated:Patient Re-evaluated prior to induction Oxygen Delivery Method: Circle System Utilized Preoxygenation: Pre-oxygenation with 100% oxygen Induction Type: IV induction Ventilation: Mask ventilation without difficulty and Oral airway inserted - appropriate to patient size Laryngoscope Size: Mac and 3 Grade View: Grade II Tube type: Oral Tube size: 7.0 mm Number of attempts: 1 Airway Equipment and Method: Stylet and Oral airway Placement Confirmation: ETT inserted through vocal cords under direct vision,  positive ETCO2 and breath sounds checked- equal and bilateral Secured at: 22 cm Tube secured with: Tape Dental Injury: Teeth and Oropharynx as per pre-operative assessment

## 2020-10-10 NOTE — Anesthesia Postprocedure Evaluation (Signed)
Anesthesia Post Note  Patient: Martha Alvarado  Procedure(s) Performed: RIGHT L5-S1 MICRODISCECTOMY LUMBAR LAMINECTOMY (N/A )     Patient location during evaluation: PACU Anesthesia Type: General Level of consciousness: awake and alert Pain management: pain level controlled Vital Signs Assessment: post-procedure vital signs reviewed and stable Respiratory status: spontaneous breathing, nonlabored ventilation, respiratory function stable and patient connected to nasal cannula oxygen Cardiovascular status: blood pressure returned to baseline and stable Postop Assessment: no apparent nausea or vomiting Anesthetic complications: no   No complications documented.  Last Vitals:  Vitals:   10/10/20 1530 10/10/20 1551  BP: 127/88 118/76  Pulse: 76 72  Resp: 15 18  Temp:    SpO2: 99% 100%    Last Pain:  Vitals:   10/10/20 1755  TempSrc:   PainSc: 4                  Dagoberto Nealy P Onnika Siebel

## 2020-10-10 NOTE — Telephone Encounter (Signed)
[employer] forms received. Sent to [employer]. 

## 2020-10-11 ENCOUNTER — Encounter (HOSPITAL_COMMUNITY): Payer: Self-pay | Admitting: Orthopaedic Surgery

## 2020-10-11 DIAGNOSIS — Z23 Encounter for immunization: Secondary | ICD-10-CM | POA: Diagnosis not present

## 2020-10-11 DIAGNOSIS — I10 Essential (primary) hypertension: Secondary | ICD-10-CM | POA: Diagnosis not present

## 2020-10-11 DIAGNOSIS — M5117 Intervertebral disc disorders with radiculopathy, lumbosacral region: Secondary | ICD-10-CM | POA: Diagnosis not present

## 2020-10-11 NOTE — Plan of Care (Signed)
Pt doing well. Pt given D/C instructions with verbal understanding. Rx's were e-prescribed to Pt's pharmacy by MD. Pt's incision is clean and dry with no sign of infection. Pt's dressing was changed prior to D/C. Pt's IV was removed prior to D/C. Pt D/C'd home via wheelchair per MD order. Pt is stable @ D/C and has no other needs at this time. Holli Humbles, RN

## 2020-10-11 NOTE — Progress Notes (Signed)
   Subjective: 1 Day Post-Op Procedure(s) (LRB): RIGHT L5-S1 MICRODISCECTOMY LUMBAR LAMINECTOMY (N/A) Patient reports pain as mild and moderate.    Objective: Vital signs in last 24 hours: Temp:  [97.7 F (36.5 C)-98.4 F (36.9 C)] 97.7 F (36.5 C) (10/12 0341) Pulse Rate:  [69-93] 80 (10/12 0341) Resp:  [15-20] 20 (10/12 0341) BP: (115-165)/(74-88) 123/83 (10/12 0341) SpO2:  [95 %-100 %] 100 % (10/12 0341) Weight:  [104.6 kg] 104.6 kg (10/11 1058)  Intake/Output from previous day: 10/11 0701 - 10/12 0700 In: 1300 [I.V.:1200; IV Piggyback:100] Out: 50 [Blood:50] Intake/Output this shift: No intake/output data recorded.  No results for input(s): HGB in the last 72 hours. No results for input(s): WBC, RBC, HCT, PLT in the last 72 hours. No results for input(s): NA, K, CL, CO2, BUN, CREATININE, GLUCOSE, CALCIUM in the last 72 hours. No results for input(s): LABPT, INR in the last 72 hours.  Neurologically intact DG Lumbar Spine 2-3 Views  Result Date: 10/10/2020 CLINICAL DATA:  Localization for L5-S1 micro discectomy EXAM: LUMBAR SPINE - 2-3 VIEW COMPARISON:  None. FINDINGS: Two lumbar intraoperative images are submitted. The first image demonstrates a localizer dorsal to the L5-S1 level. Second image demonstrates localizer dorsal to S1. IMPRESSION: Intraoperative localization. Electronically Signed   By: Macy Mis M.D.   On: 10/10/2020 19:31    Assessment/Plan: 1 Day Post-Op Procedure(s) (LRB): RIGHT L5-S1 MICRODISCECTOMY LUMBAR LAMINECTOMY (N/A) Plan:  Walking in hall. Good relief of leg pain. Discharge home office one week.   Marybelle Killings 10/11/2020, 7:37 AM

## 2020-10-11 NOTE — Progress Notes (Signed)
Late entry  Dilaudid 0.75mg  wasted in stericycle with Lovena Le Morning, RN.

## 2020-10-11 NOTE — Evaluation (Signed)
Occupational Therapy Evaluation Patient Details Name: Martha Alvarado MRN: [id number] DOB: 1966-01-22 Today's Date: 10/11/2020    History of Present Illness Patient is a 54 y/o female who presents s/p right L5-S1 microdiscectomy and laminectomy secondary to disc protrusion with radiculopathy 10/11. PMH includes HTN, HLD.   Clinical Impression   This 54 y/o female presents with the above. PTA pt independent with ADL, iADL and functional mobility, working as a Radio broadcast assistant. Pt currently requiring minA for sit<>stand from EOB (maintaining balance with minguard assist once upright), requiring up to light minA for LB ADL. Educated and further reviewed with pt re: back precautions, safety and compensatory techniques for completing ADL and functional transfers with pt/pt's spouse verbalizing understanding. Pt plans to return home with assist from spouse/daughter for ADL/iADL PRN. All questions answered with no further acute OT needs identified at this time. Pt planning to d/c home today. Acute OT to sign off, thank you for this referral.     Follow Up Recommendations  No OT follow up;Supervision/Assistance - 24 hour (24hr initially)    Equipment Recommendations  None recommended by OT           Precautions / Restrictions Precautions Precautions: Back Precaution Booklet Issued: Yes (comment) Precaution Comments: Reviewed handout and precautions Required Braces or Orthoses:  ("no brace needed") Restrictions Weight Bearing Restrictions: No      Mobility Bed Mobility Overal bed mobility: Needs Assistance Bed Mobility: Sidelying to Sit Rolling: Modified independent (Device/Increase time) Sidelying to sit: Modified independent (Device/Increase time)       General bed mobility comments: pt using bedrail but without physical assist   Transfers Overall transfer level: Needs assistance Equipment used: None Transfers: Sit to/from Stand Sit to Stand: Min assist         General  transfer comment: requiring light assist to boost from EOB    Balance Overall balance assessment: No apparent balance deficits (not formally assessed)                                         ADL either performed or assessed with clinical judgement   ADL Overall ADL's : Needs assistance/impaired Eating/Feeding: Modified independent;Sitting   Grooming: Min guard;Standing   Upper Body Bathing: Min guard;Sitting   Lower Body Bathing: Minimal assistance;Sit to/from stand   Upper Body Dressing : Set up;Sitting   Lower Body Dressing: Minimal assistance;Sit to/from stand Lower Body Dressing Details (indicate cue type and reason): pt able to don pants/underwear via figure 4 without assist. does require minA for sit<>stand from EOB.  Toilet Transfer: Min guard;Ambulation   Toileting- Clothing Manipulation and Hygiene: Min guard;Sitting/lateral lean;Sit to/from Nurse, children's Details (indicate cue type and reason): discussed and educated safe tub transfer techniques. educated in no twisting and recommend pt have spouse assist initially for increased safety, both pt/pt's spouse verbalizing understanding  Functional mobility during ADLs: Minimal assistance (sit<>Stand, minguard once up)                           Pertinent Vitals/Pain Pain Assessment: Faces Pain Score: 5  Faces Pain Scale: Hurts little more Pain Location: back Pain Descriptors / Indicators: Sore;Operative site guarding Pain Intervention(s): Limited activity within patient's tolerance;Monitored during session;Repositioned;Premedicated before session     Hand Dominance     Extremity/Trunk Assessment Upper Extremity Assessment Upper  Extremity Assessment: Overall WFL for tasks assessed   Lower Extremity Assessment Lower Extremity Assessment: Defer to PT evaluation RLE Sensation: decreased light touch (lateral aspect)   Cervical / Trunk Assessment Cervical / Trunk Assessment:  Other exceptions Cervical / Trunk Exceptions: s/p spine surgery   Communication Communication Communication: No difficulties   Cognition Arousal/Alertness: Awake/alert Behavior During Therapy: WFL for tasks assessed/performed Overall Cognitive Status: Within Functional Limits for tasks assessed                                     General Comments  incision clean, dry and intact     Exercises     Shoulder Instructions      Home Living Family/patient expects to be discharged to:: Private residence Living Arrangements: Spouse/significant other;Children Available Help at Discharge: Family;Available PRN/intermittently Type of Home: House Home Access: Stairs to enter Entrance Stairs-Number of Steps: 1   Home Layout: Two level Alternate Level Stairs-Number of Steps: 1 flight Alternate Level Stairs-Rails: Left Bathroom Shower/Tub: Teacher, early years/pre: Standard     Home Equipment: None          Prior Functioning/Environment Level of Independence: Independent        Comments: Works as a Chartered certified accountant at [employer], 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 Daughter and spouse will help at home.        OT Problem List: Decreased strength;Decreased activity tolerance;Decreased range of motion;Decreased knowledge of precautions;Pain;Decreased knowledge of use of DME or AE      OT Treatment/Interventions:      OT Goals(Current goals can be found in the care plan section) Acute Rehab OT Goals Patient Stated Goal: to go home OT Goal Formulation: With patient Time For Goal Achievement: 10/25/20 Potential to Achieve Goals: Good  OT Frequency:     Barriers to D/C:            Co-evaluation              AM-PAC OT "6 Clicks" Daily Activity     Outcome Measure Help from another person eating meals?: None Help from another person taking care of personal grooming?: A Little Help from another person toileting, which includes using toliet, bedpan, or  urinal?: A Little Help from another person bathing (including washing, rinsing, drying)?: A Little Help from another person to put on and taking off regular upper body clothing?: A Little Help from another person to put on and taking off regular lower body clothing?: A Little 6 Click Score: 19   End of Session Nurse Communication: Mobility status  Activity Tolerance: Patient tolerated treatment well Patient left: with call bell/phone within reach;with family/visitor present;Other (comment) (seated EOB )  OT Visit Diagnosis: Other abnormalities of gait and mobility (R26.89);Pain Pain - part of body:  (back)                Time: 1610-9604 OT Time Calculation (min): 16 min Charges:  OT General Charges $OT Visit: 1 Visit OT Evaluation $OT Eval Low Complexity: [place], OT Acute Rehabilitation Services Pager [phone] Office [phone]   Raymondo Band 10/11/2020, 9:28 AM

## 2020-10-11 NOTE — Evaluation (Signed)
Physical Therapy Evaluation Patient Details Name: Martha Alvarado MRN: [id number] DOB: Sep 13, 1966 Today's Date: 10/11/2020   History of Present Illness  Patient is a 54 y/o female who presents s/p right L5-S1 microdiscectomy and laminectomy secondary to disc protrusion with radiculopathy 10/11. PMH includes HTN, HLD.  Clinical Impression  Patient presents with pain and post surgical deficits s/p above surgery. Pt independent and lives with family at home; works as Chartered certified accountant at [employer]. Pt tolerated bed mobility, transfers and gait training with Supervision to mod I for safety. Noted to have increased pain after stairs decreasing gait speed. Tolerated stair training with supervision for safety. Education re: back precautions, log roll technique, walking program, positioning, car transfer etc. Will follow acutely to maximize independence and mobility prior to return home.     Follow Up Recommendations No PT follow up;Supervision - Intermittent    Equipment Recommendations  None recommended by PT    Recommendations for Other Services       Precautions / Restrictions Precautions Precautions: Back Precaution Booklet Issued: Yes (comment) Precaution Comments: Reviewed handout and precautions Restrictions Weight Bearing Restrictions: No      Mobility  Bed Mobility Overal bed mobility: Needs Assistance Bed Mobility: Rolling;Sidelying to Sit Rolling: Modified independent (Device/Increase time) Sidelying to sit: Modified independent (Device/Increase time)       General bed mobility comments: HOB flat, no use of rails to simulate home. Cues for log roll technique.  Transfers Overall transfer level: Modified independent Equipment used: None             General transfer comment: Stood from EOB x1, slow to rise walking hands up thighs.  Ambulation/Gait Ambulation/Gait assistance: Supervision Gait Distance (Feet): 400 Feet Assistive device: None Gait Pattern/deviations:  Step-through pattern;Step-to pattern;Wide base of support Gait velocity: very slow Gait velocity interpretation: <1.31 ft/sec, indicative of household ambulator General Gait Details: Slow, guarded gait with decreased speed towards end due to pain/pulling in back.  Stairs Stairs: Yes Stairs assistance: Supervision Stair Management: Step to pattern Number of Stairs: 13 General stair comments: Cues for technique, safety.  Wheelchair Mobility    Modified Rankin (Stroke Patients Only)       Balance Overall balance assessment: Modified Independent                                           Pertinent Vitals/Pain Pain Assessment: 0-10 Pain Score: 5  Pain Location: back Pain Descriptors / Indicators: Sore;Operative site guarding Pain Intervention(s): Monitored during session;Repositioned;Patient requesting pain meds-RN notified    Home Living Family/patient expects to be discharged to:: Private residence Living Arrangements: Spouse/significant other;Children Available Help at Discharge: Family;Available PRN/intermittently Type of Home: House Home Access: Stairs to enter   Entrance Stairs-Number of Steps: 1 Home Layout: Two level Home Equipment: None      Prior Function Level of Independence: Independent         Comments: Works as a Chartered certified accountant at [employer], 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 Daughter will help at home.     Hand Dominance        Extremity/Trunk Assessment   Upper Extremity Assessment Upper Extremity Assessment: Defer to OT evaluation    Lower Extremity Assessment Lower Extremity Assessment: RLE deficits/detail RLE Sensation: decreased light touch (lateral aspect)    Cervical / Trunk Assessment Cervical / Trunk Assessment: Other exceptions Cervical / Trunk Exceptions: s/p spine surgery  Communication  Communication: No difficulties  Cognition Arousal/Alertness: Awake/alert Behavior During Therapy: WFL for tasks  assessed/performed Overall Cognitive Status: Within Functional Limits for tasks assessed                                        General Comments General comments (skin integrity, edema, etc.): Incision, clean dry and intact.    Exercises     Assessment/Plan    PT Assessment Patient needs continued PT services  PT Problem List Decreased strength;Decreased mobility;Decreased balance;Pain;Impaired sensation;Decreased skin integrity;Decreased knowledge of precautions       PT Treatment Interventions Therapeutic activities;Gait training;Therapeutic exercise;Patient/family education;Balance training;Stair training;Functional mobility training    PT Goals (Current goals can be found in the Care Plan section)  Acute Rehab PT Goals Patient Stated Goal: to go home PT Goal Formulation: With patient Time For Goal Achievement: 10/25/20 Potential to Achieve Goals: Good    Frequency Min 5X/week   Barriers to discharge Inaccessible home environment stairs    Co-evaluation               AM-PAC PT "6 Clicks" Mobility  Outcome Measure Help needed turning from your back to your side while in a flat bed without using bedrails?: None Help needed moving from lying on your back to sitting on the side of a flat bed without using bedrails?: None Help needed moving to and from a bed to a chair (including a wheelchair)?: None Help needed standing up from a chair using your arms (e.g., wheelchair or bedside chair)?: None Help needed to walk in hospital room?: A Little Help needed climbing 3-5 steps with a railing? : A Little 6 Click Score: 22    End of Session   Activity Tolerance: Patient tolerated treatment well Patient left: in bed;with call bell/phone within reach;with family/visitor present (sitting EOB) Nurse Communication: Patient requests pain meds;Mobility status PT Visit Diagnosis: Pain;Difficulty in walking, not elsewhere classified (R26.2) Pain - part of body:   (back)    Time: 8366-2947 PT Time Calculation (min) (ACUTE ONLY): 14 min   Charges:   PT Evaluation $PT Eval Low Complexity: 1 Low          Marisa Severin, PT, DPT Acute Rehabilitation Services Pager [phone] Office [phone]      Marguarite Arbour A Sabra Heck 10/11/2020, 8:42 AM

## 2020-10-18 ENCOUNTER — Encounter: Payer: Self-pay | Admitting: Orthopaedic Surgery

## 2020-10-18 ENCOUNTER — Ambulatory Visit (INDEPENDENT_AMBULATORY_CARE_PROVIDER_SITE_OTHER): Payer: 59 | Admitting: Orthopaedic Surgery

## 2020-10-18 ENCOUNTER — Other Ambulatory Visit: Payer: Self-pay

## 2020-10-18 DIAGNOSIS — Z9889 Other specified postprocedural states: Secondary | ICD-10-CM

## 2020-10-18 NOTE — Progress Notes (Signed)
   Post-Op Visit Note   Patient: Martha Alvarado           Date of Birth: 1966/12/25           MRN: [id number] Visit Date: 10/18/2020 PCP: Biagio Borg, MD   Assessment & Plan: Post right L5-S1 microdiscectomy.  Good relief of leg pain she is using occasional Percocet but mostly Robaxin and gabapentin daily she is walking daily incision looks good return 1 week for staple removal.  Chief Complaint:  Chief Complaint  Patient presents with  . Lower Back - Routine Post Op    10/10/2020 Right L5-S1 microdiscectomy, lumbar laminectomy   Visit Diagnoses:  1. S/P lumbar microdiscectomy     Plan: Return in 1 week for staple removal.  Follow-Up Instructions: Return in about 1 week (around 10/25/2020).   Orders:  No orders of the defined types were placed in this encounter.  No orders of the defined types were placed in this encounter.   Imaging: No results found.  PMFS History: Patient Active Problem List   Diagnosis Date Noted  . S/P lumbar microdiscectomy 10/18/2020  . Right carpal tunnel syndrome 06/08/2020  . Low back pain with right-sided sciatica 06/08/2020  . Vitamin D deficiency 06/08/2020  . Right lumbar radiculopathy 05/27/2018  . Hyperlipidemia 05/20/2015  . 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 Early menopause 05/01/2012  . Encounter for well adult exam with abnormal findings 07/09/2011  . Impaired glucose tolerance 07/08/2011  . SYPHILIS 09/13/2007  . OBESITY NOS 09/13/2007  . Essential hypertension 09/13/2007  . CHLAMYDIAL INFECTION, HX OF 09/13/2007   Past Medical History:  Diagnosis Date  . CHLAMYDIAL INFECTION, HX OF 09/13/2007  . Hyperlipidemia 05/20/2015  . HYPERTENSION 09/13/2007  . Impaired glucose tolerance 07/08/2011  . SYPHILIS 09/13/2007    Family History  Problem Relation Age of Onset  . Diabetes Mother   . Diabetes Sister   . Hypertension Sister   . Hypertension Sister   . Colon cancer Neg Hx   . Esophageal cancer Neg Hx   . Rectal cancer  Neg Hx   . Stomach cancer Neg Hx     Past Surgical History:  Procedure Laterality Date  . LUMBAR LAMINECTOMY N/A 10/10/2020   Procedure: RIGHT L5-S1 MICRODISCECTOMY LUMBAR LAMINECTOMY;  Surgeon: Marybelle Killings, MD;  Location: [facility];  Service: Orthopedics;  Laterality: N/A;  . TUBAL LIGATION  1991   Social History   Occupational History  . Not on file  Tobacco Use  . Smoking status: Never Smoker  . Smokeless tobacco: Never Used  Vaping Use  . Vaping Use: Never used  Substance and Sexual Activity  . Alcohol use: No  . Drug use: No  . Sexual activity: Yes    Birth control/protection: Post-menopausal, Surgical    Comment: 1st intercourse 54 yo-Fewer than 5 partners-BTL

## 2020-10-19 ENCOUNTER — Other Ambulatory Visit: Payer: Self-pay | Admitting: Internal Medicine

## 2020-10-19 DIAGNOSIS — Z1231 Encounter for screening mammogram for malignant neoplasm of breast: Secondary | ICD-10-CM

## 2020-10-24 ENCOUNTER — Encounter: Payer: Self-pay | Admitting: Internal Medicine

## 2020-10-26 ENCOUNTER — Encounter: Payer: Self-pay | Admitting: Orthopaedic Surgery

## 2020-10-26 ENCOUNTER — Ambulatory Visit (INDEPENDENT_AMBULATORY_CARE_PROVIDER_SITE_OTHER): Payer: 59 | Admitting: Orthopaedic Surgery

## 2020-10-26 VITALS — Ht 66.0 in | Wt 230.0 lb

## 2020-10-26 DIAGNOSIS — Z9889 Other specified postprocedural states: Secondary | ICD-10-CM

## 2020-10-26 NOTE — Progress Notes (Signed)
   Post-Op Visit Note   Patient: Martha Alvarado           Date of Birth: 1966-06-30           MRN: [id number] Visit Date: 10/26/2020 PCP: Biagio Borg, MD   Assessment & Plan: Follow-up on microdiscectomy.  Occasionally she has had some symptoms in her leg numbness is better pain is gone occasionally at night when she moves some she will feel some pain in her leg incision looks good staples are harvested today.  She is covered until the end of November I plan to recheck her in 1 month.  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  Chief Complaint:  Chief Complaint  Patient presents with  . Lower Back - Follow-up    10/10/2020 Right L5-S1 microdiscectomy   Visit Diagnoses:  1. S/P lumbar microdiscectomy     Plan: Staples removed recheck 1 month.  She is happy with the surgical result.  Follow-Up Instructions: Return in about 1 month (around 11/26/2020).   Orders:  No orders of the defined types were placed in this encounter.  No orders of the defined types were placed in this encounter.   Imaging: No results found.  PMFS History: Patient Active Problem List   Diagnosis Date Noted  . S/P lumbar microdiscectomy 10/18/2020  . Right carpal tunnel syndrome 06/08/2020  . Low back pain with right-sided sciatica 06/08/2020  . Vitamin D deficiency 06/08/2020  . Hyperlipidemia 05/20/2015  . 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 Early menopause 05/01/2012  . Encounter for well adult exam with abnormal findings 07/09/2011  . Impaired glucose tolerance 07/08/2011  . SYPHILIS 09/13/2007  . OBESITY NOS 09/13/2007  . Essential hypertension 09/13/2007  . CHLAMYDIAL INFECTION, HX OF 09/13/2007   Past Medical History:  Diagnosis Date  . CHLAMYDIAL INFECTION, HX OF 09/13/2007  . Hyperlipidemia 05/20/2015  . HYPERTENSION 09/13/2007  . Impaired glucose tolerance 07/08/2011  . SYPHILIS 09/13/2007    Family History  Problem Relation Age of  Onset  . Diabetes Mother   . Diabetes Sister   . Hypertension Sister   . Hypertension Sister   . Colon cancer Neg Hx   . Esophageal cancer Neg Hx   . Rectal cancer Neg Hx   . Stomach cancer Neg Hx     Past Surgical History:  Procedure Laterality Date  . LUMBAR LAMINECTOMY N/A 10/10/2020   Procedure: RIGHT L5-S1 MICRODISCECTOMY LUMBAR LAMINECTOMY;  Surgeon: Marybelle Killings, MD;  Location: [facility];  Service: Orthopedics;  Laterality: N/A;  . TUBAL LIGATION  1991   Social History   Occupational History  . Not on file  Tobacco Use  . Smoking status: Never Smoker  . Smokeless tobacco: Never Used  Vaping Use  . Vaping Use: Never used  Substance and Sexual Activity  . Alcohol use: No  . Drug use: No  . Sexual activity: Yes    Birth control/protection: Post-menopausal, Surgical    Comment: 1st intercourse 54 yo-Fewer than 5 partners-BTL

## 2020-10-27 NOTE — Discharge Summary (Addendum)
Patient ID: Martha Alvarado MRN: [id number] DOB/AGE: 54-20-1967 54 y.o.  Admit date: 10/10/2020 Discharge date: 10/27/2020  Admission Diagnoses:  Lumbar disc protrusion  Discharge Diagnoses:  Lumbar disc protrusion.    status post Procedure(s): RIGHT L5-S1 MICRODISCECTOMY LUMBAR LAMINECTOMY  Past Medical History:  Diagnosis Date   CHLAMYDIAL INFECTION, HX OF 09/13/2007   Hyperlipidemia 05/20/2015   HYPERTENSION 09/13/2007   Impaired glucose tolerance 07/08/2011   SYPHILIS 09/13/2007    Surgeries: Procedure(s): RIGHT L5-S1 MICRODISCECTOMY LUMBAR LAMINECTOMY on 10/10/2020   Consultants:    Discharged Condition: Improved  Hospital Course: Martha Alvarado is an 54 y.o. female who was admitted 10/10/2020 for operative treatment of  Lumbar HNP. Patient failed conservative treatments (please see the history and physical for the specifics) and had severe unremitting pain that affects sleep, daily activities and work/hobbies. After pre-op clearance, the patient was taken to the operating room on 10/10/2020 and underwent  Procedure(s): RIGHT L5-S1 MICRODISCECTOMY LUMBAR LAMINECTOMY.    Patient was given perioperative antibiotics:  Anti-infectives (From admission, onward)    Start     Dose/Rate Route Frequency Ordered Stop   10/10/20 1030  ceFAZolin (ANCEF) IVPB 2g/100 mL premix        2 g 200 mL/hr over 30 Minutes Intravenous On call to O.R. 10/10/20 1028 10/10/20 1300        Patient was given sequential compression devices and early ambulation to prevent DVT.   Patient benefited maximally from hospital stay and there were no complications. At the time of discharge, the patient was urinating/moving their bowels without difficulty, tolerating a regular diet, pain is controlled with oral pain medications and they have been cleared by PT/OT.   Recent vital signs: No data found.   Recent laboratory studies: No results for input(s): WBC, HGB, HCT, PLT, NA, K, CL, CO2, BUN,  CREATININE, GLUCOSE, INR, CALCIUM in the last 72 hours.  Invalid input(s): PT, 2   Discharge Medications:   Allergies as of 10/11/2020   No Known Allergies      Medication List     STOP taking these medications    traMADol 50 MG tablet Commonly known as: ULTRAM       TAKE these medications    aspirin EC 81 MG tablet Take 81 mg by mouth daily. Swallow whole.   gabapentin 300 MG capsule Commonly known as: NEURONTIN Take 1 capsule (300 mg total) by mouth 3 (three) times daily.   lidocaine 5 % Commonly known as: LIDODERM PLACE 1 PATCH TO AREA OF DISCOMFORT ON THE SKIN DAILY. REMOVE & DISCARD WITHIN 12 HRS OF PLACEMENT   methocarbamol 500 MG tablet Commonly known as: ROBAXIN Take 1 tablet (500 mg total) by mouth every 6 (six) hours as needed for muscle spasms. What changed:  when to take this reasons to take this   Olmesartan-amLODIPine-HCTZ 40-5-12.5 MG Tabs Take 1 tablet by mouth daily.   oxyCODONE-acetaminophen 5-325 MG tablet Commonly known as: PERCOCET/ROXICET Take 1 tablet by mouth every 4 (four) hours as needed for severe pain.   potassium chloride 10 MEQ tablet Commonly known as: KLOR-CON Take 2 tablets (20 mEq total) by mouth daily. What changed:  how much to take when to take this   rosuvastatin 10 MG tablet Commonly known as: Crestor Take 1 tablet (10 mg total) by mouth daily.   Vitamin D (Ergocalciferol) 1.25 MG (50000 UNIT) Caps capsule Commonly known as: DRISDOL Take 1 capsule (50,000 Units total) by mouth every 7 (seven) days. What changed: when  to take this        Diagnostic Studies: DG Lumbar Spine 2-3 Views  Result Date: 10/10/2020 CLINICAL DATA:  Localization for L5-S1 micro discectomy EXAM: LUMBAR SPINE - 2-3 VIEW COMPARISON:  None. FINDINGS: Two lumbar intraoperative images are submitted. The first image demonstrates a localizer dorsal to the L5-S1 level. Second image demonstrates localizer dorsal to S1. IMPRESSION:  Intraoperative localization. Electronically Signed   By: Macy Mis M.D.   On: 10/10/2020 19:31    Discharge Instructions     Incentive spirometry RT   Complete by: As directed         Follow-up Information     Schedule an appointment as soon as possible for a visit with Marybelle Killings, MD.   Specialty: Orthopedic Surgery Why: need return office visit one week postop Contact information: [address] [address] [phone]                 Discharge Plan:  discharge to home  Disposition:     Signed: Benjiman Core  10/27/2020, 12:06 PM

## 2020-11-17 ENCOUNTER — Encounter: Payer: Self-pay | Admitting: Orthopaedic Surgery

## 2020-11-17 ENCOUNTER — Encounter: Payer: Self-pay | Admitting: Internal Medicine

## 2020-11-17 ENCOUNTER — Other Ambulatory Visit: Payer: Self-pay | Admitting: Internal Medicine

## 2020-11-17 MED ORDER — OLMESARTAN-AMLODIPINE-HCTZ 40-5-12.5 MG PO TABS
1.0000 | ORAL_TABLET | Freq: Every day | ORAL | 1 refills | Status: DC
Start: 2020-11-17 — End: 2020-11-17

## 2020-11-17 MED ORDER — ROSUVASTATIN CALCIUM 10 MG PO TABS
10.0000 mg | ORAL_TABLET | Freq: Every day | ORAL | 1 refills | Status: DC
Start: 2020-11-17 — End: 2020-11-17

## 2020-11-17 MED ORDER — METHOCARBAMOL 500 MG PO TABS: 500.0000 mg | ORAL_TABLET | Freq: Four times a day (QID) | ORAL | 1 refills | Status: DC | PRN

## 2020-11-17 MED ORDER — GABAPENTIN 300 MG PO CAPS: 300.0000 mg | ORAL_CAPSULE | Freq: Three times a day (TID) | ORAL | 1 refills | Status: DC

## 2020-11-17 MED FILL — METHOCARBAMOL 500 MG TABS: 500 | 7 days supply | Qty: 30 | Fill #0

## 2020-11-18 MED FILL — ROSUVASTATIN CALCIUM 10 MG: 10 | 90 days supply | Qty: 90 | Fill #0

## 2020-11-18 MED FILL — OLMSRTN-AMLDPN-HCTZ 40-5-12: 40-5-12.5 | 30 days supply | Qty: 30 | Fill #0

## 2020-11-18 MED FILL — GABAPENTIN 300 MG CAPSULE: 300 | 30 days supply | Qty: 90 | Fill #0

## 2020-11-29 ENCOUNTER — Ambulatory Visit (INDEPENDENT_AMBULATORY_CARE_PROVIDER_SITE_OTHER): Payer: 59 | Admitting: Orthopaedic Surgery

## 2020-11-29 ENCOUNTER — Encounter: Payer: Self-pay | Admitting: Orthopaedic Surgery

## 2020-11-29 VITALS — BP 147/91 | HR 63 | Ht 66.0 in | Wt 230.0 lb

## 2020-11-29 DIAGNOSIS — Z9889 Other specified postprocedural states: Secondary | ICD-10-CM

## 2020-11-29 NOTE — Progress Notes (Signed)
   Post-Op Visit Note   Patient: Martha Alvarado           Date of Birth: 1966/02/08           MRN: [id number] Visit Date: 11/29/2020 PCP: Biagio Borg, MD   Assessment & Plan: Post microdiscectomy right L5-S1 on 10/10/2020.  Incision looks good.  She is ready for work resumption tomorrow.  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  I can recheck her again in 2 months  Chief Complaint:  Chief Complaint  Patient presents with  . Lower Back - Follow-up    10/10/2020 right L5-S1 microdiscectomy   Visit Diagnoses:  1. 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  Continue walking program continue to work on weight loss continue to work on some core strengthening.  Recheck 2 months.  Follow-Up Instructions: Return in about 2 months (around 01/29/2021).   Orders:  No orders of the defined types were placed in this encounter.  No orders of the defined types were placed in this encounter.   Imaging: No results found.  PMFS History: Patient Active Problem List   Diagnosis Date Noted  . S/P lumbar microdiscectomy 10/18/2020  . Right carpal tunnel syndrome 06/08/2020  . Low back pain with right-sided sciatica 06/08/2020  . Vitamin D deficiency 06/08/2020  . Hyperlipidemia 05/20/2015  . 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 Early menopause 05/01/2012  . Encounter for well adult exam with abnormal findings 07/09/2011  . Impaired glucose tolerance 07/08/2011  . SYPHILIS 09/13/2007  . OBESITY NOS 09/13/2007  . Essential hypertension 09/13/2007  . CHLAMYDIAL INFECTION, HX OF 09/13/2007   Past Medical History:  Diagnosis Date  . CHLAMYDIAL INFECTION, HX OF 09/13/2007  . Hyperlipidemia 05/20/2015  . HYPERTENSION 09/13/2007  . Impaired glucose tolerance 07/08/2011  . SYPHILIS 09/13/2007    Family History  Problem Relation Age of Onset  . Diabetes Mother   . Diabetes Sister   . Hypertension  Sister   . Hypertension Sister   . Colon cancer Neg Hx   . Esophageal cancer Neg Hx   . Rectal cancer Neg Hx   . Stomach cancer Neg Hx     Past Surgical History:  Procedure Laterality Date  . LUMBAR LAMINECTOMY N/A 10/10/2020   Procedure: RIGHT L5-S1 MICRODISCECTOMY LUMBAR LAMINECTOMY;  Surgeon: Marybelle Killings, MD;  Location: [facility];  Service: Orthopedics;  Laterality: N/A;  . TUBAL LIGATION  1991   Social History   Occupational History  . Not on file  Tobacco Use  . Smoking status: Never Smoker  . Smokeless tobacco: Never Used  Vaping Use  . Vaping Use: Never used  Substance and Sexual Activity  . Alcohol use: No  . Drug use: No  . Sexual activity: Yes    Birth control/protection: Post-menopausal, Surgical    Comment: 1st intercourse 54 yo-Fewer than 5 partners-BTL

## 2020-11-30 MED FILL — OLMSRTN-AMLDPN-HCTZ 40-5-12: 40-5-12.5 | 30 days supply | Qty: 30 | Fill #0

## 2020-12-08 ENCOUNTER — Other Ambulatory Visit: Payer: Self-pay

## 2020-12-08 ENCOUNTER — Ambulatory Visit: Payer: 59 | Admitting: Internal Medicine

## 2020-12-08 ENCOUNTER — Encounter: Payer: Self-pay | Admitting: Internal Medicine

## 2020-12-08 VITALS — BP 130/86 | HR 66 | Temp 98.2°F | Ht 66.0 in | Wt 233.0 lb

## 2020-12-08 DIAGNOSIS — E785 Hyperlipidemia, unspecified: Secondary | ICD-10-CM | POA: Diagnosis not present

## 2020-12-08 DIAGNOSIS — I10 Essential (primary) hypertension: Secondary | ICD-10-CM | POA: Diagnosis not present

## 2020-12-08 DIAGNOSIS — E559 Vitamin D deficiency, unspecified: Secondary | ICD-10-CM

## 2020-12-08 DIAGNOSIS — R7302 Impaired glucose tolerance (oral): Secondary | ICD-10-CM | POA: Diagnosis not present

## 2020-12-08 LAB — BASIC METABOLIC PANEL
BUN: 17 mg/dL (ref 6–23)
CO2: 34 mEq/L — ABNORMAL HIGH (ref 19–32)
Calcium: 9.4 mg/dL (ref 8.4–10.5)
Chloride: 101 mEq/L (ref 96–112)
Creatinine, Ser: 1.13 mg/dL (ref 0.40–1.20)
GFR: 55.37 mL/min — ABNORMAL LOW (ref >60.00)
Glucose, Bld: 139 mg/dL — ABNORMAL HIGH (ref 70–99)
Potassium: 3.4 mEq/L — ABNORMAL LOW (ref 3.5–5.1)
Sodium: 142 mEq/L (ref 135–145)

## 2020-12-08 LAB — HEPATIC FUNCTION PANEL
ALT: 27 U/L (ref 0–35)
AST: 23 U/L (ref 0–37)
Albumin: 4.1 g/dL (ref 3.5–5.2)
Alkaline Phosphatase: 95 U/L (ref 39–117)
Bilirubin, Direct: 0.1 mg/dL (ref 0.0–0.3)
Total Bilirubin: 0.5 mg/dL (ref 0.2–1.2)
Total Protein: 6.9 g/dL (ref 6.0–8.3)

## 2020-12-08 LAB — LIPID PANEL
Cholesterol: 134 mg/dL (ref 0–200)
HDL: 44.9 mg/dL (ref >39.00)
LDL Cholesterol: 76 mg/dL (ref 0–99)
NonHDL: 88.81
Total CHOL/HDL Ratio: 3
Triglycerides: 62 mg/dL (ref 0.0–149.0)
VLDL: 12.4 mg/dL (ref 0.0–40.0)

## 2020-12-08 LAB — HEMOGLOBIN A1C: Hgb A1c MFr Bld: 7.2 % — ABNORMAL HIGH (ref 4.6–6.5)

## 2020-12-08 LAB — VITAMIN D 25 HYDROXY (VIT D DEFICIENCY, FRACTURES): VITD: 30.37 ng/mL (ref 30.00–100.00)

## 2020-12-08 NOTE — Patient Instructions (Signed)
Please remember to have your booster shot done soon  Please continue all other medications as before, and refills have been done if requested.  Please have the pharmacy call with any other refills you may need.  Please continue your efforts at being more active, low cholesterol diet, and weight control.  You are otherwise up to date with prevention measures today.  Please keep your appointments with your specialists as you may have planned  Please go to the LAB at the blood drawing area for the tests to be done  You will be contacted by phone if any changes need to be made immediately.  Otherwise, you will receive a letter about your results with an explanation, but please check with MyChart first.  Please remember to sign up for MyChart if you have not done so, as this will be important to you in the future with finding out test results, communicating by private email, and scheduling acute appointments online when needed.  Please make an Appointment to return in 6 months, or sooner if needed, also with Lab Appointment for testing done 3-5 days before at the [facility] (so this is for TWO appointments - please see the scheduling desk as you leave)  Due to the ongoing Covid 19 pandemic, our lab now requires an appointment for any labs done at our office.  If you need labs done and do not have an appointment, please call our office ahead of time to schedule before presenting to the lab for your testing.

## 2020-12-08 NOTE — Progress Notes (Signed)
Subjective:    Patient ID: Martha Alvarado, female    DOB: 11-Mar-1966, 54 y.o.   MRN: [id number]  HPI    Here to f/u; overall doing ok,  Pt denies chest pain, increasing sob or doe, wheezing, orthopnea, PND, increased LE swelling, palpitations, dizziness or syncope.  Pt denies new neurological symptoms such as new headache, or facial or extremity weakness or numbness.  Pt denies polydipsia, polyuria, or low sugar episode.  Pt states overall good compliance with meds, mostly trying to follow appropriate diet, with wt overall stable,  but little exercise however. Back pain improved after lumbar microdiscectomy oct 2021.  Back to work now full time, doing well with occasaional muscle spasm., but unfort gained wt throughtout.  Wt incresaed from 222 in may 2019. 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   Wt Readings from Last 3 Encounters:  12/08/20 233 lb (105.7 kg)  11/29/20 230 lb (104.3 kg)  10/26/20 230 lb (104.3 kg)   Past Medical History:  Diagnosis Date  . CHLAMYDIAL INFECTION, HX OF 09/13/2007  . Hyperlipidemia 05/20/2015  . HYPERTENSION 09/13/2007  . Impaired glucose tolerance 07/08/2011  . SYPHILIS 09/13/2007   Past Surgical History:  Procedure Laterality Date  . LUMBAR LAMINECTOMY N/A 10/10/2020   Procedure: RIGHT L5-S1 MICRODISCECTOMY LUMBAR LAMINECTOMY;  Surgeon: Marybelle Killings, MD;  Location: [facility];  Service: Orthopedics;  Laterality: N/A;  . TUBAL LIGATION  1991    reports that she has never smoked. She has never used smokeless tobacco. She reports that she does not drink alcohol and does not use drugs. family history includes Diabetes in her mother and sister; Hypertension in her sister and sister. No Known Allergies Current Outpatient Medications on File Prior to Visit  Medication Sig Dispense Refill  . aspirin EC 81 MG tablet Take 81 mg by mouth daily. Swallow whole.    . gabapentin (NEURONTIN) 300 MG capsule  Take 1 capsule (300 mg total) by mouth 3 (three) times daily. 90 capsule 1  . methocarbamol (ROBAXIN) 500 MG tablet Take 1 tablet (500 mg total) by mouth every 6 (six) hours as needed for muscle spasms. 30 tablet 1  . Olmesartan-amLODIPine-HCTZ 40-5-12.5 MG TABS Take 1 tablet by mouth daily. 90 tablet 1  . oxyCODONE-acetaminophen (PERCOCET/ROXICET) 5-325 MG tablet Take 1 tablet by mouth every 4 (four) hours as needed for severe pain. 50 tablet 0  . potassium chloride (K-DUR) 10 MEQ tablet Take 2 tablets (20 mEq total) by mouth daily. (Patient taking differently: Take 10 mEq by mouth in the morning and at bedtime.) 180 tablet 3  . rosuvastatin (CRESTOR) 10 MG tablet Take 1 tablet (10 mg total) by mouth daily. 90 tablet 1   No current facility-administered medications on file prior to visit.   Review of Systems All otherwise neg per pt     Objective:   Physical Exam BP 130/86 (BP Location: Left Arm, Patient Position: Sitting, Cuff Size: Large)   Pulse 66   Temp 98.2 F (36.8 C) (Oral)   Ht 5\' 6"  (1.676 m)   Wt 233 lb (105.7 kg)   SpO2 95%   BMI 37.61 kg/m  VS noted,  Constitutional: Pt appears in NAD HENT: Head: NCAT.  Right Ear: External ear normal.  Left Ear: External ear normal.  Eyes: . Pupils are equal, round, and reactive to light. Conjunctivae and EOM are normal Nose: without d/c or deformity Neck:  Neck supple. Gross normal ROM Cardiovascular: Normal rate and regular rhythm.   Pulmonary/Chest: Effort normal and breath sounds without rales or wheezing.  Abd:  Soft, NT, ND, + BS, no organomegaly Neurological: Pt is alert. At baseline orientation, motor grossly intact Skin: Skin is warm. No rashes, other new lesions, no LE edema Psychiatric: Pt behavior is normal without agitation  All otherwise neg per pt  Lab Results  Component Value Date   WBC 4.6 09/28/2020   HGB 12.9 09/28/2020   HCT 40.9 09/28/2020   PLT 246 09/28/2020   GLUCOSE 139 (H) 12/08/2020   CHOL 134  12/08/2020   TRIG 62.0 12/08/2020   HDL 44.90 12/08/2020   LDLCALC 76 12/08/2020   ALT 27 12/08/2020   AST 23 12/08/2020   NA 142 12/08/2020   K 3.4 (L) 12/08/2020   CL 101 12/08/2020   CREATININE 1.13 12/08/2020   BUN 17 12/08/2020   CO2 34 (H) 12/08/2020   TSH 1.85 06/08/2020   HGBA1C 7.2 (H) 12/08/2020      Assessment & Plan:

## 2020-12-09 ENCOUNTER — Encounter: Payer: Self-pay | Admitting: Internal Medicine

## 2020-12-09 NOTE — Assessment & Plan Note (Signed)
stable overall by history and exam, recent data reviewed with pt, and pt to continue medical treatment as before,  to f/u any worsening symptoms or concerns  

## 2020-12-09 NOTE — Assessment & Plan Note (Addendum)
Cont oral replaceement  I spent 31 minutes in preparing to see the patient by review of recent labs, imaging and procedures, obtaining and reviewing separately obtained history, communicating with the patient and family or caregiver, ordering medications, tests or procedures, and documenting clinical information in the EHR including the differential Dx, treatment, and any further evaluation and other management of vit d def, htn, hld, hyperglycemia

## 2020-12-13 ENCOUNTER — Other Ambulatory Visit: Payer: Self-pay

## 2020-12-13 ENCOUNTER — Ambulatory Visit
Admission: RE | Admit: 2020-12-13 | Discharge: 2020-12-13 | Disposition: A | Discharge status: Home / Self Care | Payer: 59 | Source: Ambulatory Visit | Attending: Internal Medicine | Admitting: Internal Medicine

## 2020-12-13 DIAGNOSIS — Z1231 Encounter for screening mammogram for malignant neoplasm of breast: Secondary | ICD-10-CM

## 2021-01-10 MED FILL — OLMSRTN-AMLDPN-HCTZ 40-5-12: 40-5-12.5 | 30 days supply | Qty: 30 | Fill #1

## 2021-01-27 ENCOUNTER — Ambulatory Visit: Payer: 59 | Admitting: Orthopaedic Surgery

## 2021-02-10 MED FILL — ROSUVASTATIN CALCIUM 10 MG: 10 | 30 days supply | Qty: 30 | Fill #1

## 2021-02-10 MED FILL — GABAPENTIN 300 MG CAPSULE: 300 | 30 days supply | Qty: 90 | Fill #1

## 2021-02-10 MED FILL — OLMSRTN-AMLDPN-HCTZ 40-5-12: 40-5-12.5 | 30 days supply | Qty: 30 | Fill #2

## 2021-02-12 ENCOUNTER — Encounter: Payer: Self-pay | Admitting: Internal Medicine

## 2021-02-13 ENCOUNTER — Other Ambulatory Visit: Payer: Self-pay

## 2021-02-13 ENCOUNTER — Other Ambulatory Visit: Payer: Self-pay | Admitting: Internal Medicine

## 2021-02-13 MED ORDER — POTASSIUM CHLORIDE ER 10 MEQ PO TBCR
10.0000 meq | EXTENDED_RELEASE_TABLET | Freq: Two times a day (BID) | ORAL | 2 refills | Status: DC
Start: 2021-02-13 — End: 2021-02-13

## 2021-02-13 MED ORDER — METHOCARBAMOL 500 MG PO TABS: 500.0000 mg | ORAL_TABLET | Freq: Four times a day (QID) | ORAL | 1 refills | Status: DC | PRN

## 2021-02-13 MED FILL — POTASSIUM CL ER 10 MEQ TAB: 10 | 30 days supply | Qty: 60 | Fill #0

## 2021-02-14 MED FILL — METHOCARBAMOL 500 MG TABS: 500 | 15 days supply | Qty: 60 | Fill #0

## 2021-02-17 ENCOUNTER — Encounter: Payer: Self-pay | Admitting: Orthopaedic Surgery

## 2021-02-17 ENCOUNTER — Ambulatory Visit: Payer: 59 | Admitting: Orthopaedic Surgery

## 2021-02-17 VITALS — BP 160/83 | Ht 66.0 in | Wt 235.0 lb

## 2021-02-17 DIAGNOSIS — Z9889 Other specified postprocedural states: Secondary | ICD-10-CM | POA: Diagnosis not present

## 2021-02-17 NOTE — Progress Notes (Signed)
Office Visit Note   Patient: Martha Alvarado           Date of Birth: 28-Aug-1966           MRN: [id number] Visit Date: 02/17/2021              Requested by: Biagio Borg, MD [address] [place],  [place] [address] PCP: Biagio Borg, MD   Assessment & Plan: Visit Diagnoses:  1. S/P lumbar microdiscectomy     Plan: Patient is happy the surgical result we will release her from care check her back on a as needed basis.  Encouraged her to continue her walking and dieting program to get to her target weight of 180.  She can return if she has recurrent problems.  Follow-Up Instructions: Return if symptoms worsen or fail to improve.   Orders:  No orders of the defined types were placed in this encounter.  No orders of the defined types were placed in this encounter.     Procedures: No procedures performed   Clinical Data: No additional findings.   Subjective: Chief Complaint  Patient presents with  . Lower Back - Follow-up    10/10/2020 Right L5-S1 microdiscectomy, lumbar laminectomy    HPI 55 year old female returns post microdiscectomy right L5-S1 10/10/2020.  She also has some disc degeneration and bulging at L3-4 and L45 some paracentral and some lateral.  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  She states her goal is to get back down to a weight of 180 and is working on a walking program.  At times she has had some spasms in her right leg intermittently feels like cramping.  The severe constant burning pain she had before surgery has been relieved.  Review of Systems updated unchanged since October surgery.   Objective: Vital Signs: BP (!) 160/83   Ht 5\' 6"  (1.676 m)   Wt 235 lb (106.6 kg)   BMI 37.93 kg/m   Physical Exam Constitutional:      Appearance: She is well-developed.  HENT:     Head: Normocephalic.     Right Ear: External ear normal.     Left Ear: External  ear normal.  Eyes:     Pupils: Pupils are equal, round, and reactive to light.  Neck:     Thyroid: No thyromegaly.     Trachea: No tracheal deviation.  Cardiovascular:     Rate and Rhythm: Normal rate.  Pulmonary:     Effort: Pulmonary effort is normal.  Abdominal:     Palpations: Abdomen is soft.  Skin:    General: Skin is warm and dry.  Neurological:     Mental Status: She is alert and oriented to person, place, and time.  Psychiatric:        Mood and Affect: Mood and affect normal.        Behavior: Behavior normal.     Ortho Exam lumbar incision well-healed.  No tenderness.  Sciatic notch is nontender no trochanteric bursal tenderness normal hip range of motion normal heel toe gait no limping.  Good gastrocsoleus strength right and left. Specialty Comments:  No specialty comments available.  Imaging: No results found.   PMFS History: Patient Active Problem List   Diagnosis Date Noted  . S/P lumbar microdiscectomy 10/18/2020  . Right carpal tunnel syndrome 06/08/2020  . Low back pain with right-sided sciatica 06/08/2020  .  Vitamin D deficiency 06/08/2020  . Hyperlipidemia 05/20/2015  . 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 Early menopause 05/01/2012  . Encounter for well adult exam with abnormal findings 07/09/2011  . Impaired glucose tolerance 07/08/2011  . SYPHILIS 09/13/2007  . OBESITY NOS 09/13/2007  . Essential hypertension 09/13/2007  . CHLAMYDIAL INFECTION, HX OF 09/13/2007   Past Medical History:  Diagnosis Date  . CHLAMYDIAL INFECTION, HX OF 09/13/2007  . Hyperlipidemia 05/20/2015  . HYPERTENSION 09/13/2007  . Impaired glucose tolerance 07/08/2011  . SYPHILIS 09/13/2007    Family History  Problem Relation Age of Onset  . Diabetes Mother   . Diabetes Sister   . Hypertension Sister   . Hypertension Sister   . Colon cancer Neg Hx   . Esophageal cancer Neg Hx   . Rectal cancer Neg Hx   . Stomach cancer Neg Hx     Past Surgical History:  Procedure  Laterality Date  . LUMBAR LAMINECTOMY N/A 10/10/2020   Procedure: RIGHT L5-S1 MICRODISCECTOMY LUMBAR LAMINECTOMY;  Surgeon: Marybelle Killings, MD;  Location: [facility];  Service: Orthopedics;  Laterality: N/A;  . TUBAL LIGATION  1991   Social History   Occupational History  . Not on file  Tobacco Use  . Smoking status: Never Smoker  . Smokeless tobacco: Never Used  Vaping Use  . Vaping Use: Never used  Substance and Sexual Activity  . Alcohol use: No  . Drug use: No  . Sexual activity: Yes    Birth control/protection: Post-menopausal, Surgical    Comment: 1st intercourse 55 yo-Fewer than 5 partners-BTL

## 2021-04-17 ENCOUNTER — Other Ambulatory Visit (HOSPITAL_COMMUNITY): Payer: Self-pay

## 2021-04-17 MED FILL — Potassium Chloride Tab ER 10 mEq: ORAL | 30 days supply | Qty: 60 | Fill #0 | Status: AC

## 2021-04-17 MED FILL — Rosuvastatin Calcium Tab 10 MG: ORAL | 30 days supply | Qty: 30 | Fill #0 | Status: AC

## 2021-04-17 MED FILL — Olmesartan-Amlodipine-Hydrochlorothiazide Tab 40-5-12.5 MG: ORAL | 30 days supply | Qty: 30 | Fill #0 | Status: AC

## 2021-04-18 ENCOUNTER — Other Ambulatory Visit (HOSPITAL_COMMUNITY): Payer: Self-pay

## 2021-04-19 ENCOUNTER — Other Ambulatory Visit (HOSPITAL_COMMUNITY): Payer: Self-pay

## 2021-04-20 ENCOUNTER — Other Ambulatory Visit (HOSPITAL_COMMUNITY): Payer: Self-pay

## 2021-05-18 MED FILL — Olmesartan-Amlodipine-Hydrochlorothiazide Tab 40-5-12.5 MG: ORAL | 30 days supply | Qty: 30 | Fill #1 | Status: AC

## 2021-05-22 ENCOUNTER — Other Ambulatory Visit (HOSPITAL_COMMUNITY): Payer: Self-pay

## 2021-05-25 ENCOUNTER — Other Ambulatory Visit (HOSPITAL_COMMUNITY): Payer: Self-pay

## 2021-05-25 MED ORDER — AMOXICILLIN 500 MG PO CAPS
500.0000 mg | ORAL_CAPSULE | Freq: Three times a day (TID) | ORAL | 0 refills | Status: DC
  Filled 2021-05-25: qty 21, 7d supply, fill #0

## 2021-06-06 ENCOUNTER — Other Ambulatory Visit (HOSPITAL_COMMUNITY): Payer: Self-pay

## 2021-06-06 ENCOUNTER — Encounter: Payer: Self-pay | Admitting: Internal Medicine

## 2021-06-06 ENCOUNTER — Ambulatory Visit (INDEPENDENT_AMBULATORY_CARE_PROVIDER_SITE_OTHER): Payer: 59 | Admitting: Internal Medicine

## 2021-06-06 ENCOUNTER — Other Ambulatory Visit: Payer: Self-pay

## 2021-06-06 VITALS — BP 144/82 | HR 57 | Temp 98.6°F | Ht 66.0 in | Wt 221.6 lb

## 2021-06-06 DIAGNOSIS — E1165 Type 2 diabetes mellitus with hyperglycemia: Secondary | ICD-10-CM | POA: Diagnosis not present

## 2021-06-06 DIAGNOSIS — E538 Deficiency of other specified B group vitamins: Secondary | ICD-10-CM | POA: Diagnosis not present

## 2021-06-06 DIAGNOSIS — I10 Essential (primary) hypertension: Secondary | ICD-10-CM | POA: Diagnosis not present

## 2021-06-06 DIAGNOSIS — E559 Vitamin D deficiency, unspecified: Secondary | ICD-10-CM

## 2021-06-06 DIAGNOSIS — Z0001 Encounter for general adult medical examination with abnormal findings: Secondary | ICD-10-CM

## 2021-06-06 DIAGNOSIS — E7800 Pure hypercholesterolemia, unspecified: Secondary | ICD-10-CM | POA: Diagnosis not present

## 2021-06-06 DIAGNOSIS — E876 Hypokalemia: Secondary | ICD-10-CM | POA: Insufficient documentation

## 2021-06-06 LAB — URINALYSIS, ROUTINE W REFLEX MICROSCOPIC
Bilirubin Urine: NEGATIVE
Hgb urine dipstick: NEGATIVE
Ketones, ur: NEGATIVE
Leukocytes,Ua: NEGATIVE
Nitrite: NEGATIVE
RBC / HPF: NONE SEEN (ref 0–?)
Specific Gravity, Urine: 1.025 (ref 1.000–1.030)
Total Protein, Urine: NEGATIVE
Urine Glucose: NEGATIVE
Urobilinogen, UA: 1 (ref 0.0–1.0)
pH: 6 (ref 5.0–8.0)

## 2021-06-06 LAB — HEPATIC FUNCTION PANEL
ALT: 24 U/L (ref 0–35)
AST: 19 U/L (ref 0–37)
Albumin: 4.3 g/dL (ref 3.5–5.2)
Alkaline Phosphatase: 89 U/L (ref 39–117)
Bilirubin, Direct: 0.1 mg/dL (ref 0.0–0.3)
Total Bilirubin: 0.6 mg/dL (ref 0.2–1.2)
Total Protein: 6.9 g/dL (ref 6.0–8.3)

## 2021-06-06 LAB — BASIC METABOLIC PANEL
BUN: 20 mg/dL (ref 6–23)
CO2: 27 mEq/L (ref 19–32)
Calcium: 9.2 mg/dL (ref 8.4–10.5)
Chloride: 104 mEq/L (ref 96–112)
Creatinine, Ser: 1.02 mg/dL (ref 0.40–1.20)
GFR: 62.39 mL/min (ref >60.00)
Glucose, Bld: 94 mg/dL (ref 70–99)
Potassium: 3.3 mEq/L — ABNORMAL LOW (ref 3.5–5.1)
Sodium: 142 mEq/L (ref 135–145)

## 2021-06-06 LAB — CBC WITH DIFFERENTIAL/PLATELET
Basophils Absolute: 0 10*3/uL (ref 0.0–0.1)
Basophils Relative: 0.4 % (ref 0.0–3.0)
Eosinophils Absolute: 0.1 10*3/uL (ref 0.0–0.7)
Eosinophils Relative: 1.3 % (ref 0.0–5.0)
HCT: 40.3 % (ref 36.0–46.0)
Hemoglobin: 13.8 g/dL (ref 12.0–15.0)
Lymphocytes Relative: 51.9 % — ABNORMAL HIGH (ref 12.0–46.0)
Lymphs Abs: 2.2 10*3/uL (ref 0.7–4.0)
MCHC: 34.2 g/dL (ref 30.0–36.0)
MCV: 83.1 fl (ref 78.0–100.0)
Monocytes Absolute: 0.4 10*3/uL (ref 0.1–1.0)
Monocytes Relative: 9.2 % (ref 3.0–12.0)
Neutro Abs: 1.6 10*3/uL (ref 1.4–7.7)
Neutrophils Relative %: 37.2 % — ABNORMAL LOW (ref 43.0–77.0)
Platelets: 231 10*3/uL (ref 150.0–400.0)
RBC: 4.85 Mil/uL (ref 3.87–5.11)
RDW: 13.8 % (ref 11.5–15.5)
WBC: 4.3 10*3/uL (ref 4.0–10.5)

## 2021-06-06 LAB — MICROALBUMIN / CREATININE URINE RATIO
Creatinine,U: 228 mg/dL
Microalb Creat Ratio: 1.1 mg/g (ref 0.0–30.0)
Microalb, Ur: 2.5 mg/dL — ABNORMAL HIGH (ref 0.0–1.9)

## 2021-06-06 LAB — VITAMIN D 25 HYDROXY (VIT D DEFICIENCY, FRACTURES): VITD: 27.67 ng/mL — ABNORMAL LOW (ref 30.00–100.00)

## 2021-06-06 LAB — LIPID PANEL
Cholesterol: 204 mg/dL — ABNORMAL HIGH (ref 0–200)
HDL: 56.5 mg/dL (ref >39.00)
LDL Cholesterol: 136 mg/dL — ABNORMAL HIGH (ref 0–99)
NonHDL: 147.8
Total CHOL/HDL Ratio: 4
Triglycerides: 58 mg/dL (ref 0.0–149.0)
VLDL: 11.6 mg/dL (ref 0.0–40.0)

## 2021-06-06 LAB — VITAMIN B12: Vitamin B-12: 567 pg/mL (ref 211–911)

## 2021-06-06 LAB — HEMOGLOBIN A1C: Hgb A1c MFr Bld: 6.1 % (ref 4.6–6.5)

## 2021-06-06 LAB — TSH: TSH: 2.28 u[IU]/mL (ref 0.35–4.50)

## 2021-06-06 LAB — MAGNESIUM: Magnesium: 2 mg/dL (ref 1.5–2.5)

## 2021-06-06 MED ORDER — GABAPENTIN 300 MG PO CAPS
ORAL_CAPSULE | Freq: Three times a day (TID) | ORAL | 5 refills | Status: DC
  Filled 2021-06-06: qty 90, 30d supply, fill #0
  Filled 2022-01-26: qty 90, 30d supply, fill #1

## 2021-06-06 MED ORDER — ROSUVASTATIN CALCIUM 10 MG PO TABS
ORAL_TABLET | Freq: Every day | ORAL | 3 refills | Status: DC
  Filled 2021-06-06: qty 30, 30d supply, fill #0
  Filled 2021-07-10: qty 30, 30d supply, fill #1
  Filled 2021-08-18: qty 30, 30d supply, fill #2
  Filled 2021-09-19: qty 30, 30d supply, fill #3
  Filled 2021-10-20: qty 30, 30d supply, fill #4
  Filled 2021-11-08: qty 30, 30d supply, fill #5

## 2021-06-06 MED ORDER — POTASSIUM CHLORIDE ER 10 MEQ PO TBCR
EXTENDED_RELEASE_TABLET | ORAL | 3 refills | Status: DC
  Filled 2021-06-06: qty 60, 30d supply, fill #0
  Filled 2021-07-10: qty 60, 30d supply, fill #1
  Filled 2021-08-18: qty 60, 30d supply, fill #2
  Filled 2021-10-03: qty 60, 30d supply, fill #3
  Filled 2021-11-08: qty 60, 30d supply, fill #4
  Filled 2021-12-22: qty 60, 30d supply, fill #5
  Filled 2022-02-01: qty 60, 30d supply, fill #6
  Filled 2022-03-14: qty 60, 30d supply, fill #7
  Filled 2022-04-19: qty 60, 30d supply, fill #8
  Filled 2022-06-01: qty 60, 30d supply, fill #9

## 2021-06-06 MED ORDER — OLMESARTAN-AMLODIPINE-HCTZ 40-5-12.5 MG PO TABS
1.0000 | ORAL_TABLET | Freq: Every day | ORAL | 3 refills | Status: DC
  Filled 2021-06-06: qty 30, 30d supply, fill #0
  Filled 2021-06-06: qty 90, 90d supply, fill #0
  Filled 2021-06-09: qty 30, 30d supply, fill #0
  Filled 2021-07-19: qty 30, 30d supply, fill #1
  Filled 2021-08-18: qty 30, 30d supply, fill #2
  Filled 2021-09-19: qty 30, 30d supply, fill #3
  Filled 2021-10-20: qty 30, 30d supply, fill #4
  Filled 2021-11-24: qty 30, 30d supply, fill #5
  Filled 2021-12-25: qty 30, 30d supply, fill #6
  Filled 2022-01-26: qty 30, 30d supply, fill #7
  Filled 2022-03-01: qty 30, 30d supply, fill #8
  Filled 2022-03-29: qty 30, 30d supply, fill #9
  Filled 2022-05-01: qty 30, 30d supply, fill #10
  Filled 2022-06-01: qty 30, 30d supply, fill #11

## 2021-06-06 NOTE — Patient Instructions (Addendum)
Please check to see if your insurance pays for the shingles shot  Ok to increase the Vit D to double what you are now taking  Please continue all other medications as before, and refills have been done if requested.  Please have the pharmacy call with any other refills you may need.  Please continue your efforts at being more active, low cholesterol diet, and weight control.  You are otherwise up to date with prevention measures today.  Please keep your appointments with your specialists as you may have planned  Please go to the LAB at the blood drawing area for the tests to be done  You will be contacted by phone if any changes need to be made immediately.  Otherwise, you will receive a letter about your results with an explanation, but please check with MyChart first.  Please remember to sign up for MyChart if you have not done so, as this will be important to you in the future with finding out test results, communicating by private email, and scheduling acute appointments online when needed.  Please make an Appointment to return in 6 months, or sooner if needed

## 2021-06-06 NOTE — Progress Notes (Signed)
Patient ID: Martha Alvarado, female   DOB: 1966-08-09, 55 y.o.   MRN: [id number]         Chief Complaint:: wellness exam and Follow-up  dm, htn, low vit d, low K, and hld       HPI:  Martha Alvarado is a 55 y.o. female here for wellness exam; decliens shingrix for now; othewise up to date with preventive referrals an dimmunizations.                         Also for two left upper teeth removed later today.  Pt denies chest pain, increased sob or doe, wheezing, orthopnea, PND, increased LE swelling, palpitations, dizziness or syncope.   Pt denies polydipsia, polyuria, or new focal neuro s/s.    Pt denies fever, wt loss, night sweats, loss of appetite, or other constitutional symptoms  No other new complaints. Taking 200 u vit d qd.  Has not had recent mag checked with low K.  BP has been < 140/90 at home and will continue to monitor.  Pt has a new position at work with increased overall activity, and lost wt with better diet per pt   Wt Readings from Last 3 Encounters:  06/06/21 221 lb 9.6 oz (100.5 kg)  02/17/21 235 lb (106.6 kg)  12/08/20 233 lb (105.7 kg)   BP Readings from Last 3 Encounters:  06/06/21 (!) 144/82  02/17/21 (!) 160/83  12/08/20 130/86   Immunization History  Administered Date(s) Administered  . Influenza,inj,Quad PF,6+ Mos 10/11/2020  . PFIZER(Purple Top)SARS-COV-2 Vaccination 12/30/2019, 01/18/2020, 12/19/2020  . Td 04/26/2009  . Tdap 06/25/2019   Health Maintenance Due  Topic Date Due  . PNEUMOCOCCAL POLYSACCHARIDE VACCINE AGE 62-64 HIGH RISK  Never done  . Pneumococcal Vaccine 54-77 Years old (1 of 2 - PPSV23) Never done  . FOOT EXAM  Never done  . OPHTHALMOLOGY EXAM  Never done      Past Medical History:  Diagnosis Date  . CHLAMYDIAL INFECTION, HX OF 09/13/2007  . Hyperlipidemia 05/20/2015  . HYPERTENSION 09/13/2007  . Impaired glucose tolerance 07/08/2011  . SYPHILIS 09/13/2007   Past Surgical History:  Procedure Laterality Date  . LUMBAR LAMINECTOMY N/A  10/10/2020   Procedure: RIGHT L5-S1 MICRODISCECTOMY LUMBAR LAMINECTOMY;  Surgeon: Marybelle Killings, MD;  Location: [facility];  Service: Orthopedics;  Laterality: N/A;  . TUBAL LIGATION  1991    reports that she has never smoked. She has never used smokeless tobacco. She reports that she does not drink alcohol and does not use drugs. family history includes Diabetes in her mother and sister; Hypertension in her sister and sister. No Known Allergies Current Outpatient Medications on File Prior to Visit  Medication Sig Dispense Refill  . aspirin EC 81 MG tablet Take 81 mg by mouth daily. Swallow whole.    . methocarbamol (ROBAXIN) 500 MG tablet TAKE 1 TABLET BY MOUTH EVERY 6 HOURS AS NEEDED FOR MUSCLE SPASMS 60 tablet 1   No current facility-administered medications on file prior to visit.        ROS:  All others reviewed and negative.  Objective        PE:  BP (!) 144/82 (BP Location: Left Arm, Patient Position: Sitting, Cuff Size: Large)   Pulse (!) 57   Temp 98.6 F (37 C) (Oral)   Ht 5\' 6"  (1.676 m)   Wt 221 lb 9.6 oz (100.5 kg)   SpO2 97%   BMI 35.77  kg/m                 Constitutional: Pt appears in NAD               HENT: Head: NCAT.                Right Ear: External ear normal.                 Left Ear: External ear normal.                Eyes: . Pupils are equal, round, and reactive to light. Conjunctivae and EOM are normal               Nose: without d/c or deformity               Neck: Neck supple. Gross normal ROM               Cardiovascular: Normal rate and regular rhythm.                 Pulmonary/Chest: Effort normal and breath sounds without rales or wheezing.                Abd:  Soft, NT, ND, + BS, no organomegaly               Neurological: Pt is alert. At baseline orientation, motor grossly intact               Skin: Skin is warm. No rashes, no other new lesions, LE edema - none               Psychiatric: Pt behavior is normal without agitation   Micro:  none  Cardiac tracings I have personally interpreted today:  none  Pertinent Radiological findings (summarize): none   Lab Results  Component Value Date   WBC 4.3 06/06/2021   HGB 13.8 06/06/2021   HCT 40.3 06/06/2021   PLT 231.0 06/06/2021   GLUCOSE 94 06/06/2021   CHOL 204 (H) 06/06/2021   TRIG 58.0 06/06/2021   HDL 56.50 06/06/2021   LDLCALC 136 (H) 06/06/2021   ALT 24 06/06/2021   AST 19 06/06/2021   NA 142 06/06/2021   K 3.3 (L) 06/06/2021   CL 104 06/06/2021   CREATININE 1.02 06/06/2021   BUN 20 06/06/2021   CO2 27 06/06/2021   TSH 2.28 06/06/2021   HGBA1C 6.1 06/06/2021   MICROALBUR 2.5 (H) 06/06/2021   Assessment/Plan:  Martha Alvarado is a 55 y.o. Black or African American [2] female with  has a past medical history of CHLAMYDIAL INFECTION, HX OF (09/13/2007), Hyperlipidemia (05/20/2015), HYPERTENSION (09/13/2007), Impaired glucose tolerance (07/08/2011), and SYPHILIS (09/13/2007).  Vitamin D deficiency Last vitamin D Lab Results  Component Value Date   VD25OH 30.37 12/08/2020   Low normal, for incrsaed oral replacement D3 to 4000 u qd   Encounter for well adult exam with abnormal findings Age and sex appropriate education and counseling updated with regular exercise and diet Referrals for preventative services - none needed Immunizations addressed - declines shingrix Smoking counseling  - none needed Evidence for depression or other mood disorder - none significant Most recent labs reviewed. I have personally reviewed and have noted: 1) the patient's medical and social history 2) The patient's current medications and supplements 3) The patient's height, weight, and BMI have been recorded in the chart   Hypertension, uncontrolled Mild increased today, pt states controlled at home < 140/90,  declines med change today BP Readings from Last 3 Encounters:  06/06/21 (!) 144/82  02/17/21 (!) 160/83  12/08/20 130/86     Diabetes ([place]) Lab Results  Component  Value Date   HGBA1C 6.1 06/06/2021   Stable, pt to continue current medical treatment  - diet   Hyperlipidemia Lab Results  Component Value Date   LDLCALC 136 (H) 06/06/2021   Stable, pt to continue current statin - to start crestor 10 qd, low chol diet   Hypokalemia Also for magnesium with labs,  to f/u any worsening symptoms or concerns  Followup: Return in about 6 months (around 12/06/2021).  [name], MD 06/07/2021 10:55 PM [facility]

## 2021-06-06 NOTE — Assessment & Plan Note (Signed)
Last vitamin D Lab Results  Component Value Date   VD25OH 30.37 12/08/2020   Low normal, for incrsaed oral replacement D3 to 4000 u qd

## 2021-06-07 ENCOUNTER — Encounter: Payer: Self-pay | Admitting: Internal Medicine

## 2021-06-07 NOTE — Assessment & Plan Note (Signed)

## 2021-06-07 NOTE — Assessment & Plan Note (Signed)
Also for magnesium with labs,  to f/u any worsening symptoms or concerns

## 2021-06-07 NOTE — Assessment & Plan Note (Signed)
Mild increased today, pt states controlled at home < 140/90, declines med change today BP Readings from Last 3 Encounters:  06/06/21 (!) 144/82  02/17/21 (!) 160/83  12/08/20 130/86

## 2021-06-07 NOTE — Assessment & Plan Note (Signed)
Lab Results  Component Value Date   LDLCALC 136 (H) 06/06/2021   Stable, pt to continue current statin - to start crestor 10 qd, low chol diet

## 2021-06-07 NOTE — Assessment & Plan Note (Signed)
Lab Results  Component Value Date   HGBA1C 6.1 06/06/2021   Stable, pt to continue current medical treatment  - diet

## 2021-06-09 ENCOUNTER — Other Ambulatory Visit (HOSPITAL_COMMUNITY): Payer: Self-pay

## 2021-06-12 ENCOUNTER — Other Ambulatory Visit (HOSPITAL_COMMUNITY): Payer: Self-pay

## 2021-06-15 IMAGING — XA Imaging study
2 series · 2 of 2 positions shown · non-contrast
Comparison: none

CLINICAL DATA: Lumbosacral spondylosis without myelopathy.
Right-sided radiculopathy. Excellent relief after prior injection in
June 2018 with recurrence of similar symptoms.

[Series 1: ortho standard · 1 of 1 slices shown (1 of 2)]
[im 1/1]
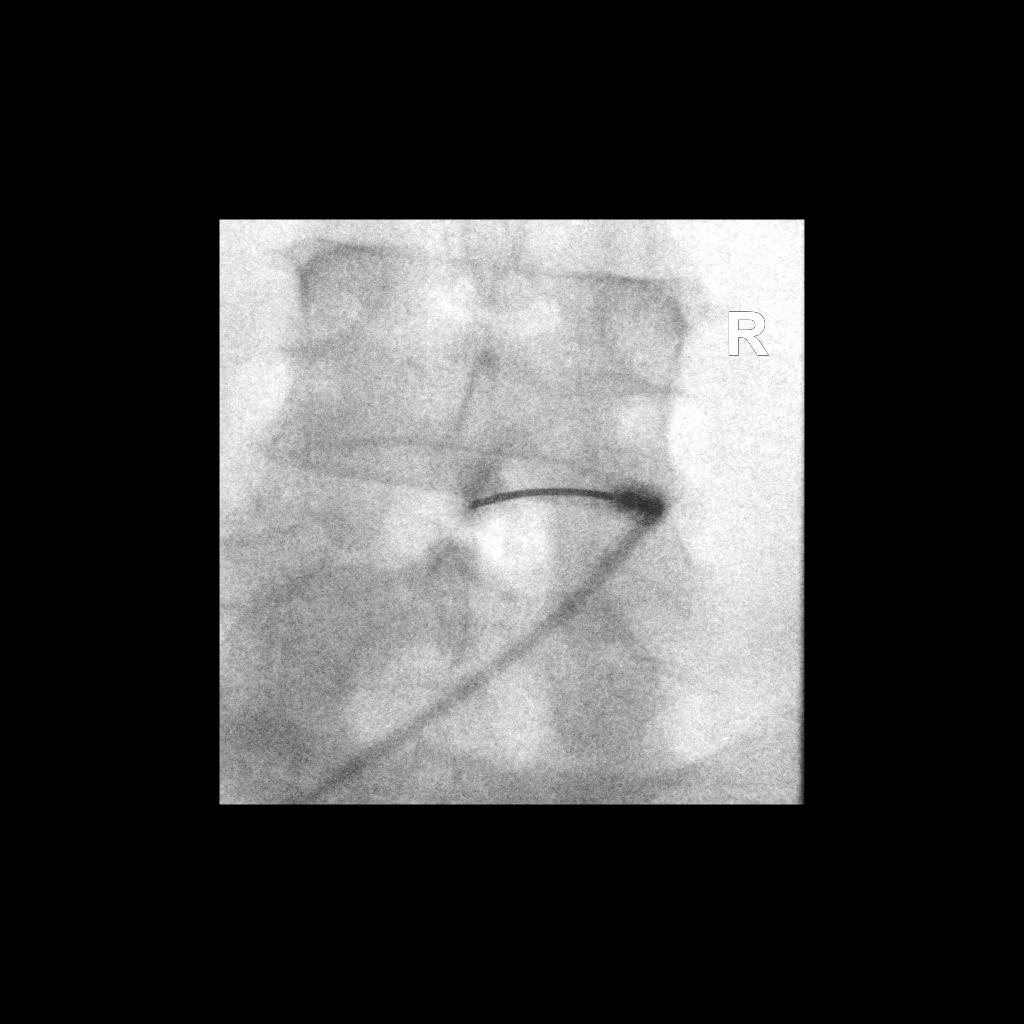

[Series 2: ortho standard · 1 of 1 slices shown (2 of 2)]
[im 1/1]
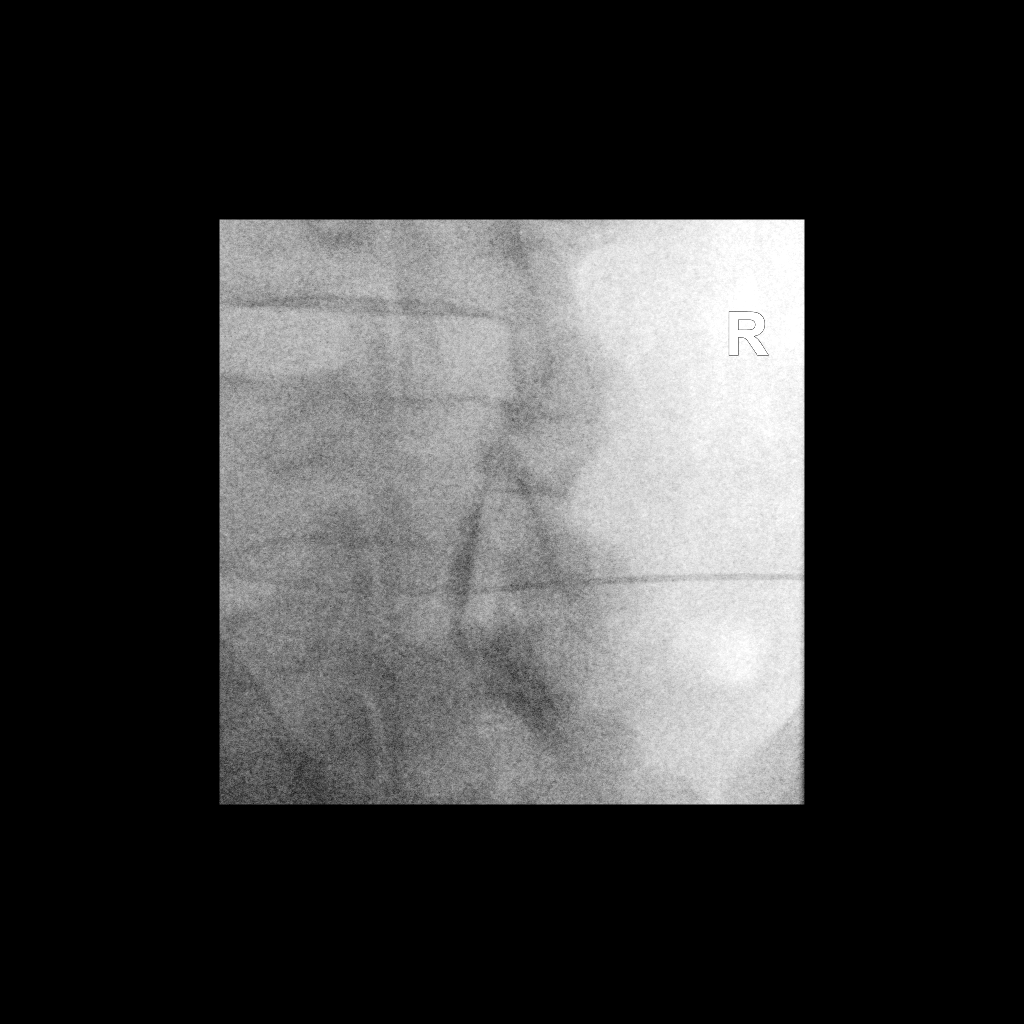

[2 of 2 positions shown; findings below may reference images not displayed]

FLUOROSCOPY TIME:  Radiation Exposure Index (as provided by the
fluoroscopic device): 3.6 mGy

Fluoroscopy Time:  3 seconds

Number of Acquired Images:  0

PROCEDURE:
The procedure, risks, benefits, and alternatives were explained to
the patient. Questions regarding the procedure were encouraged and
answered. The patient understands and consents to the procedure.

LUMBAR EPIDURAL INJECTION:

An interlaminar approach was performed on the right at L4-L5. The
overlying skin was cleansed and anesthetized. A 3.5 inch 20 gauge
epidural needle was advanced using loss-of-resistance technique.

DIAGNOSTIC EPIDURAL INJECTION:

Injection of Isovue-M 200 shows a good epidural pattern with spread
above and below the level of needle placement, primarily in the
midline. No vascular opacification is seen.

THERAPEUTIC EPIDURAL INJECTION:

120 mg of Depo-Medrol mixed with 3 mL of 1% lidocaine were
instilled. The procedure was well-tolerated, and the patient was
discharged thirty minutes following the injection in good condition.

COMPLICATIONS:
None immediate.
IMPRESSION: Technically successful interlaminar epidural injection on the right
at L4-L5.

## 2021-07-13 ENCOUNTER — Other Ambulatory Visit (HOSPITAL_COMMUNITY): Payer: Self-pay

## 2021-07-19 ENCOUNTER — Other Ambulatory Visit (HOSPITAL_COMMUNITY): Payer: Self-pay

## 2021-07-20 ENCOUNTER — Other Ambulatory Visit (HOSPITAL_COMMUNITY): Payer: Self-pay

## 2021-07-26 ENCOUNTER — Other Ambulatory Visit (HOSPITAL_COMMUNITY): Payer: Self-pay

## 2021-07-26 MED ORDER — HYDROCODONE-ACETAMINOPHEN 5-325 MG PO TABS
ORAL_TABLET | ORAL | 0 refills | Status: DC
  Filled 2021-07-26: qty 6, 1d supply, fill #0

## 2021-08-18 ENCOUNTER — Other Ambulatory Visit (HOSPITAL_COMMUNITY): Payer: Self-pay

## 2021-08-19 ENCOUNTER — Other Ambulatory Visit (HOSPITAL_COMMUNITY): Payer: Self-pay

## 2021-08-21 ENCOUNTER — Other Ambulatory Visit (HOSPITAL_COMMUNITY): Payer: Self-pay

## 2021-09-07 ENCOUNTER — Other Ambulatory Visit: Payer: Self-pay

## 2021-09-07 ENCOUNTER — Ambulatory Visit (INDEPENDENT_AMBULATORY_CARE_PROVIDER_SITE_OTHER): Payer: 59 | Admitting: Obstetrics and Gynecology

## 2021-09-07 ENCOUNTER — Encounter: Payer: Self-pay | Admitting: Obstetrics and Gynecology

## 2021-09-07 VITALS — BP 124/70 | HR 68 | Ht 66.0 in | Wt 217.0 lb

## 2021-09-07 DIAGNOSIS — Z01419 Encounter for gynecological examination (general) (routine) without abnormal findings: Secondary | ICD-10-CM | POA: Diagnosis not present

## 2021-09-07 NOTE — Patient Instructions (Signed)

## 2021-09-07 NOTE — Progress Notes (Signed)
54 y.o. G5P2002 Married Black or [place] American Not Hispanic or Latino female here for annual exam.  No vaginal bleeding. No dyspareunia. No bowel or bladder c/o.    She has issues with chronic back pain, has already had surgery.   H/O DM, last HgbA1C was 6.1% in 6/22, diet controlled. She has lost of 20 lbs.  No LMP recorded. Patient is postmenopausal.          Sexually active: Yes.    The current method of family planning is tubal ligation.    Exercising: Yes.     walking Smoker:  no  Health Maintenance: Pap:  01/29/18 WNL Hr HPV Neg, 05/01/12 wnl  History of abnormal Pap:  no MMG:  12/13/20  Bi-rads 1 neg  BMD:   none  Colonoscopy: 07/31/17 polyps, f/u in 10 years.   TDaP:  06/25/2019 Gardasil: n/a   reports that she has never smoked. She has never used smokeless tobacco. She reports that she does not drink alcohol and does not use drugs. Rare ETOH. 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 Son is 109, daughter is 26. Yolanda Bonine is 6. Everyone is local.   Past Medical History:  Diagnosis Date   CHLAMYDIAL INFECTION, HX OF 09/13/2007   Hyperlipidemia 05/20/2015   HYPERTENSION 09/13/2007   Impaired glucose tolerance 07/08/2011   SYPHILIS 09/13/2007    Past Surgical History:  Procedure Laterality Date   LUMBAR LAMINECTOMY N/A 10/10/2020   Procedure: RIGHT L5-S1 MICRODISCECTOMY LUMBAR LAMINECTOMY;  Surgeon: Marybelle Killings, MD;  Location: [facility];  Service: Orthopedics;  Laterality: N/A;   TUBAL LIGATION  1991    Current Outpatient Medications  Medication Sig Dispense Refill   aspirin EC 81 MG tablet Take 81 mg by mouth daily. Swallow whole.     gabapentin (NEURONTIN) 300 MG capsule TAKE 1 CAPSULE BY MOUTH 3 TIMES DAILY 90 capsule 5   methocarbamol (ROBAXIN) 500 MG tablet TAKE 1 TABLET BY MOUTH EVERY 6 HOURS AS NEEDED FOR MUSCLE SPASMS 60 tablet 1   Olmesartan-amLODIPine-HCTZ 40-5-12.5 MG TABS TAKE 1 TABLET BY MOUTH ONCE A DAY 90 tablet 3   potassium chloride  (KLOR-CON) 10 MEQ tablet TAKE 1 TABLET BY MOUTH EVERY MORNING AND EVERY NIGHT AT BEDTIME 180 tablet 3   rosuvastatin (CRESTOR) 10 MG tablet TAKE 1 TABLET BY MOUTH ONCE A DAY 90 tablet 3   No current facility-administered medications for this visit.    Family History  Problem Relation Age of Onset   Diabetes Mother    Diabetes Sister    Hypertension Sister    Hypertension Sister    Colon cancer Neg Hx    Esophageal cancer Neg Hx    Rectal cancer Neg Hx    Stomach cancer Neg Hx     Review of Systems  All other systems reviewed and are negative.  Exam:   BP 124/70   Pulse 68   Ht '5\' 6"'$  (1.676 m)   Wt 217 lb (98.4 kg)   SpO2 99%   BMI 35.02 kg/m   Weight change: '@WEIGHTCHANGE'$ @ Height:   Height: '5\' 6"'$  (167.6 cm)  Ht Readings from Last 3 Encounters:  09/07/21 '5\' 6"'$  (1.676 m)  06/06/21 '5\' 6"'$  (1.676 m)  02/17/21 '5\' 6"'$  (1.676 m)    General appearance: alert, cooperative and appears stated age Head: Normocephalic, without obvious abnormality, atraumatic Neck: no adenopathy, supple, symmetrical, trachea midline and thyroid normal to inspection and palpation Lungs: clear to auscultation bilaterally  Cardiovascular: regular rate and rhythm Breasts: normal appearance, no masses or tenderness Abdomen: soft, non-tender; non distended,  no masses,  no organomegaly Extremities: extremities normal, atraumatic, no cyanosis or edema Skin: Skin color, texture, turgor normal. No rashes or lesions Lymph nodes: Cervical, supraclavicular, and axillary nodes normal. No abnormal inguinal nodes palpated Neurologic: Grossly normal   Pelvic: External genitalia:  no lesions              Urethra:  normal appearing urethra with no masses, tenderness or lesions              Bartholins and Skenes: normal                 Vagina: normal appearing vagina with normal color and discharge, no lesions              Cervix: no lesions               Bimanual Exam:  Uterus:   no masses or tenderness               Adnexa: no mass, fullness, tenderness               Rectovaginal: Confirms               Anus:  normal sphincter tone, no lesions  Gae Dry chaperoned for the exam.  1. Well woman exam No pap this year Mammogram in 12/22 Labs with primary Colonoscopy due in 2028 Discussed breast self exam Discussed calcium and vit D intake

## 2021-09-20 ENCOUNTER — Other Ambulatory Visit (HOSPITAL_COMMUNITY): Payer: Self-pay

## 2021-10-03 ENCOUNTER — Other Ambulatory Visit (HOSPITAL_COMMUNITY): Payer: Self-pay

## 2021-10-20 ENCOUNTER — Other Ambulatory Visit (HOSPITAL_COMMUNITY): Payer: Self-pay

## 2021-10-23 ENCOUNTER — Other Ambulatory Visit (HOSPITAL_COMMUNITY): Payer: Self-pay

## 2021-11-09 ENCOUNTER — Other Ambulatory Visit (HOSPITAL_COMMUNITY): Payer: Self-pay

## 2021-11-11 ENCOUNTER — Other Ambulatory Visit (HOSPITAL_COMMUNITY): Payer: Self-pay

## 2021-11-14 ENCOUNTER — Other Ambulatory Visit (HOSPITAL_COMMUNITY): Payer: Self-pay

## 2021-11-21 ENCOUNTER — Other Ambulatory Visit (HOSPITAL_COMMUNITY): Payer: Self-pay

## 2021-11-21 ENCOUNTER — Encounter: Payer: Self-pay | Admitting: Family Medicine

## 2021-11-21 ENCOUNTER — Telehealth (INDEPENDENT_AMBULATORY_CARE_PROVIDER_SITE_OTHER): Payer: 59 | Admitting: Family Medicine

## 2021-11-21 VITALS — Temp 99.1°F

## 2021-11-21 DIAGNOSIS — R6889 Other general symptoms and signs: Secondary | ICD-10-CM | POA: Diagnosis not present

## 2021-11-21 MED ORDER — OSELTAMIVIR PHOSPHATE 75 MG PO CAPS: 75.0000 mg | ORAL_CAPSULE | Freq: Two times a day (BID) | ORAL | 0 refills | Status: DC

## 2021-11-21 MED ORDER — BENZONATATE 100 MG PO CAPS: ORAL_CAPSULE | ORAL | 0 refills | Status: DC

## 2021-11-21 MED ORDER — BENZONATATE 100 MG PO CAPS
ORAL_CAPSULE | ORAL | 0 refills | Status: DC
  Filled 2021-11-21: qty 30, 7d supply, fill #0

## 2021-11-21 MED ORDER — OSELTAMIVIR PHOSPHATE 75 MG PO CAPS
75.0000 mg | ORAL_CAPSULE | Freq: Two times a day (BID) | ORAL | 0 refills | Status: DC
  Filled 2021-11-21: qty 10, 5d supply, fill #0

## 2021-11-21 NOTE — Patient Instructions (Addendum)
---------------------------------------------------------------------------------------------------------------------------    WORK SLIP:  Patient Martha Alvarado,  08/31/1966, was seen for a medical visit today, 11/21/21 . Please excuse from work for a COVID/flu like illness. If Covid19 testing is positive she will likely be contagious for 7010 days from the onset of symptoms (11/20/21) PLUS 1 day of no fever and improved symptoms. Will defer to employer for a sooner return to work if patient has 2 negative covid tests 48 hours apart and is feeling better, or if symptoms have resolved, it is greater than 5 days since the positive test and the patient can wear a high-quality, tight fitting mask such as N95 or KN95 at all times for an additional 5 days. Would also suggest COVID19 antigen testing is negative prior to early return from a Covid illness.  Sincerely: E-signature: Dr. Colin Benton, DO [place] Ph: [phone]   ------------------------------------------------------------------------------------------------------------------------------   HOME CARE TIPS:  -COVID19 testing information: [email or web address].[email or web address]  Most pharmacies also offer testing and home test kits. If the Covid19 test is positive and you desire antiviral treatment, please contact a [facility] or schedule a follow up virtual visit through your primary care office or through the [facility].  Other test to treat options: ConnectRV.is?[email or web address]  -I sent the medication(s) we discussed to your pharmacy: Meds ordered this encounter  Medications   DISCONTD: oseltamivir (TAMIFLU) 75 MG capsule    Sig: Take 1 capsule (75 mg total) by mouth 2 (two) times daily.    Dispense:  10 capsule    Refill:  0   DISCONTD: benzonatate (TESSALON PERLES) 100 MG capsule    Sig: Take 1 to 2 capsules twice daily as needed for cough     Dispense:  30 capsule    Refill:  0   benzonatate (TESSALON PERLES) 100 MG capsule    Sig: 1-2 capsules twice daily as needed for cough    Dispense:  30 capsule    Refill:  0   oseltamivir (TAMIFLU) 75 MG capsule    Sig: Take 1 capsule (75 mg total) by mouth 2 (two) times daily.    Dispense:  10 capsule    Refill:  0     -can use tylenol or aleve if needed for fevers, aches and pains per instructions  -can use nasal saline a few times per day if you have nasal congestion; sometimes  a short course of Afrin nasal spray for 3 days can help with symptoms as well  -stay hydrated, drink plenty of fluids and eat small healthy meals - avoid dairy  -can take 1000 IU (68mcg) Vit D3 and 100-500 mg of Vit C daily per instructions  -If the Covid test is positive, check out the [facility] website for more information on home care, transmission and treatment for COVID19  -follow up with your doctor in 2-3 days unless improving and feeling better  -stay home while sick, except to seek medical care. If you have COVID19, ideally it would be best to stay home for a full 10 days since the onset of symptoms PLUS one day of no fever and feeling better. Wear a good mask that fits snugly (such as N95 or KN95) if around others to reduce the risk of transmission.  It was nice to meet you today, and I really hope you are feeling better soon. I help [facility] out with telemedicine visits on Tuesdays and Thursdays and am available for visits on those days. If you have any concerns  or questions following this visit please schedule a follow up visit with your Primary Care doctor or seek care at a local urgent care clinic to avoid delays in care.    Seek in person care or schedule a follow up video visit promptly if your symptoms worsen, new concerns arise or you are not improving with treatment. Call 911 and/or seek emergency care if your symptoms are severe or life threatening.

## 2021-11-21 NOTE — Progress Notes (Signed)
Virtual Visit via Video Note  I connected with Martha Alvarado  on 11/21/21 at  5:00 PM EST by a video enabled telemedicine application and verified that I am speaking with the correct person using two identifiers.  Location patient: home, [place] Location provider:work or home office Persons participating in the virtual visit: patient, provider  I discussed the limitations of evaluation and management by telemedicine and the availability of in person appointments. The patient expressed understanding and agreed to proceed.   HPI:  Acute telemedicine visit for flu like symptoms: -Onset:yesterday -Symptoms include:chills, sore throat, cough, body aches, headache, temp 99.1 -Denies: CP, SOB, NVD -Pertinent past medical history:see below -Pertinent medication allergies: No Known Allergies -COVID-19 vaccine status:  Immunization History  Administered Date(s) Administered   Influenza,inj,Quad PF,6+ Mos 10/11/2020   PFIZER(Purple Top)SARS-COV-2 Vaccination 12/30/2019, 01/18/2020, 12/19/2020, 10/17/2021   Td 04/26/2009   Tdap 06/25/2019    ROS: See pertinent positives and negatives per HPI.  Past Medical History:  Diagnosis Date   CHLAMYDIAL INFECTION, HX OF 09/13/2007   Hyperlipidemia 05/20/2015   HYPERTENSION 09/13/2007   Impaired glucose tolerance 07/08/2011   SYPHILIS 09/13/2007    Past Surgical History:  Procedure Laterality Date   LUMBAR LAMINECTOMY N/A 10/10/2020   Procedure: RIGHT L5-S1 MICRODISCECTOMY LUMBAR LAMINECTOMY;  Surgeon: Marybelle Killings, MD;  Location: [facility];  Service: Orthopedics;  Laterality: N/A;   TUBAL LIGATION  1991     Current Outpatient Medications:    aspirin EC 81 MG tablet, Take 81 mg by mouth daily. Swallow whole., Disp: , Rfl:    gabapentin (NEURONTIN) 300 MG capsule, TAKE 1 CAPSULE BY MOUTH 3 TIMES DAILY, Disp: 90 capsule, Rfl: 5   methocarbamol (ROBAXIN) 500 MG tablet, TAKE 1 TABLET BY MOUTH EVERY 6 HOURS AS NEEDED FOR MUSCLE SPASMS, Disp: 60 tablet, Rfl: 1    Olmesartan-amLODIPine-HCTZ 40-5-12.5 MG TABS, TAKE 1 TABLET BY MOUTH ONCE A DAY, Disp: 90 tablet, Rfl: 3   potassium chloride (KLOR-CON) 10 MEQ tablet, TAKE 1 TABLET BY MOUTH EVERY MORNING AND EVERY NIGHT AT BEDTIME, Disp: 180 tablet, Rfl: 3   rosuvastatin (CRESTOR) 10 MG tablet, TAKE 1 TABLET BY MOUTH ONCE A DAY, Disp: 90 tablet, Rfl: 3   benzonatate (TESSALON PERLES) 100 MG capsule, 1-2 capsules twice daily as needed for cough, Disp: 30 capsule, Rfl: 0   oseltamivir (TAMIFLU) 75 MG capsule, Take 1 capsule (75 mg total) by mouth 2 (two) times daily., Disp: 10 capsule, Rfl: 0  EXAM:  VITALS per patient if applicable:  GENERAL: alert, oriented, appears well and in no acute distress  HEENT: atraumatic, conjunttiva clear, no obvious abnormalities on inspection of external nose and ears  NECK: normal movements of the head and neck  LUNGS: on inspection no signs of respiratory distress, breathing rate appears normal, no obvious gross SOB, gasping or wheezing  CV: no obvious cyanosis  MS: moves all visible extremities without noticeable abnormality  PSYCH/NEURO: pleasant and cooperative, no obvious depression or anxiety, speech and thought processing grossly intact  ASSESSMENT AND PLAN:  Discussed the following assessment and plan:  Flu-like symptoms  -we discussed possible serious and likely etiologies, options for evaluation and workup, limitations of telemedicine visit vs in person visit, treatment, treatment risks and precautions. Pt is agreeable to treatment via telemedicine at this moment. Query influenza, covid19 vs other viral resp illness vs other. She prefers to start empiric tamiflu and tessalon for cough and do home covid testing. Advised if covid testing positive to stop tamiflu and if  desires antiviral and in first 5 days of symptoms could call [facility] pharmacy or schedule follow up vv for treatment.  Work/School slipped offered: provided in patient instructions   Advised  to seek prompt in person care if worsening, new symptoms arise, or if is not improving as expected.   I discussed the assessment and treatment plan with the patient. The patient was provided an opportunity to ask questions and all were answered. The patient agreed with the plan and demonstrated an understanding of the instructions.     Lucretia Kern, DO

## 2021-11-22 ENCOUNTER — Other Ambulatory Visit (HOSPITAL_COMMUNITY): Payer: Self-pay

## 2021-11-27 ENCOUNTER — Other Ambulatory Visit (HOSPITAL_COMMUNITY): Payer: Self-pay

## 2021-11-29 ENCOUNTER — Other Ambulatory Visit (HOSPITAL_COMMUNITY): Payer: Self-pay

## 2021-12-06 ENCOUNTER — Encounter: Payer: Self-pay | Admitting: Internal Medicine

## 2021-12-06 ENCOUNTER — Ambulatory Visit (INDEPENDENT_AMBULATORY_CARE_PROVIDER_SITE_OTHER): Payer: 59 | Admitting: Internal Medicine

## 2021-12-06 ENCOUNTER — Other Ambulatory Visit (HOSPITAL_COMMUNITY): Payer: Self-pay

## 2021-12-06 ENCOUNTER — Other Ambulatory Visit: Payer: Self-pay

## 2021-12-06 VITALS — BP 132/70 | HR 55 | Temp 98.8°F | Ht 66.0 in | Wt 213.0 lb

## 2021-12-06 DIAGNOSIS — Z23 Encounter for immunization: Secondary | ICD-10-CM | POA: Diagnosis not present

## 2021-12-06 DIAGNOSIS — E559 Vitamin D deficiency, unspecified: Secondary | ICD-10-CM | POA: Diagnosis not present

## 2021-12-06 DIAGNOSIS — E1165 Type 2 diabetes mellitus with hyperglycemia: Secondary | ICD-10-CM | POA: Diagnosis not present

## 2021-12-06 DIAGNOSIS — E7800 Pure hypercholesterolemia, unspecified: Secondary | ICD-10-CM | POA: Diagnosis not present

## 2021-12-06 DIAGNOSIS — M5441 Lumbago with sciatica, right side: Secondary | ICD-10-CM | POA: Diagnosis not present

## 2021-12-06 DIAGNOSIS — G8929 Other chronic pain: Secondary | ICD-10-CM

## 2021-12-06 DIAGNOSIS — I10 Essential (primary) hypertension: Secondary | ICD-10-CM

## 2021-12-06 DIAGNOSIS — E538 Deficiency of other specified B group vitamins: Secondary | ICD-10-CM

## 2021-12-06 LAB — BASIC METABOLIC PANEL
BUN: 17 mg/dL (ref 6–23)
CO2: 33 mEq/L — ABNORMAL HIGH (ref 19–32)
Calcium: 9.3 mg/dL (ref 8.4–10.5)
Chloride: 105 mEq/L (ref 96–112)
Creatinine, Ser: 1.03 mg/dL (ref 0.40–1.20)
GFR: 61.45 mL/min (ref >60.00)
Glucose, Bld: 89 mg/dL (ref 70–99)
Potassium: 3.4 mEq/L — ABNORMAL LOW (ref 3.5–5.1)
Sodium: 144 mEq/L (ref 135–145)

## 2021-12-06 LAB — LIPID PANEL
Cholesterol: 137 mg/dL (ref 0–200)
HDL: 57 mg/dL (ref >39.00)
LDL Cholesterol: 70 mg/dL (ref 0–99)
NonHDL: 80.03
Total CHOL/HDL Ratio: 2
Triglycerides: 50 mg/dL (ref 0.0–149.0)
VLDL: 10 mg/dL (ref 0.0–40.0)

## 2021-12-06 LAB — HEPATIC FUNCTION PANEL
ALT: 21 U/L (ref 0–35)
AST: 18 U/L (ref 0–37)
Albumin: 4 g/dL (ref 3.5–5.2)
Alkaline Phosphatase: 75 U/L (ref 39–117)
Bilirubin, Direct: 0.2 mg/dL (ref 0.0–0.3)
Total Bilirubin: 0.7 mg/dL (ref 0.2–1.2)
Total Protein: 6.6 g/dL (ref 6.0–8.3)

## 2021-12-06 LAB — HEMOGLOBIN A1C: Hgb A1c MFr Bld: 6.1 % (ref 4.6–6.5)

## 2021-12-06 MED ORDER — ROSUVASTATIN CALCIUM 40 MG PO TABS
40.0000 mg | ORAL_TABLET | Freq: Every day | ORAL | 3 refills | Status: DC
  Filled 2021-12-06: qty 30, 30d supply, fill #0
  Filled 2022-01-08: qty 30, 30d supply, fill #1
  Filled 2022-02-22: qty 30, 30d supply, fill #2
  Filled 2022-03-27: qty 30, 30d supply, fill #3
  Filled 2022-05-01: qty 30, 30d supply, fill #4
  Filled 2022-06-03: qty 30, 30d supply, fill #5
  Filled 2022-07-05: qty 30, 30d supply, fill #6
  Filled 2022-08-03: qty 30, 30d supply, fill #7
  Filled 2022-09-08: qty 30, 30d supply, fill #8
  Filled 2022-10-03: qty 30, 30d supply, fill #9

## 2021-12-06 MED ORDER — CHOLECALCIFEROL 50 MCG (2000 UT) PO TABS: ORAL_TABLET | ORAL | 99 refills | Status: AC

## 2021-12-06 NOTE — Progress Notes (Signed)
Patient ID: Martha Alvarado, female   DOB: [birth year], 55 y.o.   MRN: [id number]        Chief Complaint: follow up HTN, HLD and hyperglycemia, right sciatica       HPI:  Martha Alvarado is a 55 y.o. female here overall doing ok;  Pt denies chest pain, increased sob or doe, wheezing, orthopnea, PND, increased LE swelling, palpitations, dizziness or syncope.   Pt denies polydipsia, polyuria, or low sugar symptoms  Pt is S/p recent influenza last wk - tx with tamiflu, has not yet had the flu shot.  Wt coming down with better diet, more exercise at work.  Pt continues to have recurring LBP with right sciatica with mod intermittent flares, needs FMLA form signed, but no bowel or bladder change, fever, wt loss,  worsening numbness/weakness, gait change or falls.  Not taking vit d Wt Readings from Last 3 Encounters:  12/06/21 213 lb (96.6 kg)  09/07/21 217 lb (98.4 kg)  06/06/21 221 lb 9.6 oz (100.5 kg)   BP Readings from Last 3 Encounters:  12/06/21 132/70  09/07/21 124/70  06/06/21 (!) 144/82         Past Medical History:  Diagnosis Date   CHLAMYDIAL INFECTION, HX OF 09/13/2007   Hyperlipidemia 05/20/2015   HYPERTENSION 09/13/2007   Impaired glucose tolerance 07/08/2011   SYPHILIS 09/13/2007   Past Surgical History:  Procedure Laterality Date   LUMBAR LAMINECTOMY N/A 10/10/2020   Procedure: RIGHT L5-S1 MICRODISCECTOMY LUMBAR LAMINECTOMY;  Surgeon: Marybelle Killings, MD;  Location: [facility];  Service: Orthopedics;  Laterality: N/A;   TUBAL LIGATION  1991    reports that she has never smoked. She has never used smokeless tobacco. She reports that she does not drink alcohol and does not use drugs. family history includes Diabetes in her mother and sister; Hypertension in her sister and sister. No Known Allergies Current Outpatient Medications on File Prior to Visit  Medication Sig Dispense Refill   aspirin EC 81 MG tablet Take 81 mg by mouth daily. Swallow whole.     benzonatate (TESSALON PERLES) 100  MG capsule 1-2 capsules twice daily as needed for cough 30 capsule 0   gabapentin (NEURONTIN) 300 MG capsule TAKE 1 CAPSULE BY MOUTH 3 TIMES DAILY 90 capsule 5   methocarbamol (ROBAXIN) 500 MG tablet TAKE 1 TABLET BY MOUTH EVERY 6 HOURS AS NEEDED FOR MUSCLE SPASMS 60 tablet 1   Olmesartan-amLODIPine-HCTZ 40-5-12.5 MG TABS TAKE 1 TABLET BY MOUTH ONCE A DAY 90 tablet 3   potassium chloride (KLOR-CON) 10 MEQ tablet TAKE 1 TABLET BY MOUTH EVERY MORNING AND EVERY NIGHT AT BEDTIME 180 tablet 3   No current facility-administered medications on file prior to visit.        ROS:  All others reviewed and negative.  Objective        PE:  BP 132/70 (BP Location: Right Arm, Patient Position: Sitting, Cuff Size: Large)   Pulse (!) 55   Temp 98.8 F (37.1 C) (Oral)   Ht 5\' 6"  (1.676 m)   Wt 213 lb (96.6 kg)   SpO2 98%   BMI 34.38 kg/m                 Constitutional: Pt appears in NAD               HENT: Head: NCAT.                Right Ear: External ear normal.  Left Ear: External ear normal.                Eyes: . Pupils are equal, round, and reactive to light. Conjunctivae and EOM are normal               Nose: without d/c or deformity               Neck: Neck supple. Gross normal ROM               Cardiovascular: Normal rate and regular rhythm.                 Pulmonary/Chest: Effort normal and breath sounds without rales or wheezing.                Abd:  Soft, NT, ND, + BS, no organomegaly               Neurological: Pt is alert. At baseline orientation, motor grossly intact               Skin: Skin is warm. No rashes, no other new lesions, LE edema - none               Psychiatric: Pt behavior is normal without agitation   Micro: none  Cardiac tracings I have personally interpreted today:  none  Pertinent Radiological findings (summarize): none   Lab Results  Component Value Date   WBC 4.3 06/06/2021   HGB 13.8 06/06/2021   HCT 40.3 06/06/2021   PLT 231.0 06/06/2021    GLUCOSE 89 12/06/2021   CHOL 137 12/06/2021   TRIG 50.0 12/06/2021   HDL 57.00 12/06/2021   LDLCALC 70 12/06/2021   ALT 21 12/06/2021   AST 18 12/06/2021   NA 144 12/06/2021   K 3.4 (L) 12/06/2021   CL 105 12/06/2021   CREATININE 1.03 12/06/2021   BUN 17 12/06/2021   CO2 33 (H) 12/06/2021   TSH 2.28 06/06/2021   HGBA1C 6.1 12/06/2021   MICROALBUR 2.5 (H) 06/06/2021   Assessment/Plan:  Martha Alvarado is a 55 y.o. Black or African American [2] female with  has a past medical history of CHLAMYDIAL INFECTION, HX OF (09/13/2007), Hyperlipidemia (05/20/2015), HYPERTENSION (09/13/2007), Impaired glucose tolerance (07/08/2011), and SYPHILIS (09/13/2007).  Vitamin D deficiency Last vitamin D Lab Results  Component Value Date   VD25OH 27.67 (L) 06/06/2021   Low, to start oral replacement   Hypertension, uncontrolled BP Readings from Last 3 Encounters:  12/06/21 132/70  09/07/21 124/70  06/06/21 (!) 144/82   Stable, pt to continue medical treatment tribenzor   Diabetes [facility]) Lab Results  Component Value Date   HGBA1C 6.1 12/06/2021   Stable, pt to continue current medical treatment  -  diet   Hyperlipidemia Lab Results  Component Value Date   [place] 70 12/06/2021   Uncontrolled, goal ldl < 70, pt to increase crestor 40 qd   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  Martha Cower, MD 12/10/2021 8:25 PM [facility]

## 2021-12-06 NOTE — Patient Instructions (Addendum)
You had the flu shot today  You are given the work note today  [place] to increase the crestor to 40 mg per day  Please take OTC Vitamin D3 at 2000 units per day, indefinitely  Please continue all other medications as before, and refills have been done if requested.  Please have the pharmacy call with any other refills you may need.  Please continue your efforts at being more active, low cholesterol diet, and weight control  Please keep your appointments with your specialists as you may have planned  Please go to the LAB at the blood drawing area for the tests to be done  You will be contacted by phone if any changes need to be made immediately.  Otherwise, you will receive a letter about your results with an explanation, but please check with MyChart first.  Please remember to sign up for MyChart if you have not done so, as this will be important to you in the future with finding out test results, communicating by private email, and scheduling acute appointments online when needed.  Please make an Appointment to return in 6 months, or sooner if needed

## 2021-12-06 NOTE — Assessment & Plan Note (Signed)
Last vitamin D Lab Results  Component Value Date   VD25OH 27.67 (L) 06/06/2021   Low, to start oral replacement

## 2021-12-07 ENCOUNTER — Encounter: Payer: Self-pay | Admitting: Internal Medicine

## 2021-12-10 ENCOUNTER — Encounter: Payer: Self-pay | Admitting: Internal Medicine

## 2021-12-10 NOTE — Assessment & Plan Note (Signed)
Lab Results  Component Value Date   LDLCALC 70 12/06/2021   Uncontrolled, goal ldl < 70, pt to increase crestor 40 qd

## 2021-12-10 NOTE — Assessment & Plan Note (Signed)
Lab Results  Component Value Date   HGBA1C 6.1 12/06/2021   Stable, pt to continue current medical treatment  -  diet

## 2021-12-10 NOTE — Assessment & Plan Note (Signed)
Chronic recurrent mild to mod but overall stable, for work note as per pt reqeust

## 2021-12-10 NOTE — Assessment & Plan Note (Signed)
BP Readings from Last 3 Encounters:  12/06/21 132/70  09/07/21 124/70  06/06/21 (!) 144/82   Stable, pt to continue medical treatment tribenzor

## 2021-12-22 ENCOUNTER — Other Ambulatory Visit (HOSPITAL_COMMUNITY): Payer: Self-pay

## 2021-12-26 ENCOUNTER — Other Ambulatory Visit (HOSPITAL_COMMUNITY): Payer: Self-pay

## 2021-12-27 ENCOUNTER — Other Ambulatory Visit (HOSPITAL_COMMUNITY): Payer: Self-pay

## 2022-01-08 ENCOUNTER — Other Ambulatory Visit (HOSPITAL_COMMUNITY): Payer: Self-pay

## 2022-01-26 ENCOUNTER — Other Ambulatory Visit (HOSPITAL_COMMUNITY): Payer: Self-pay

## 2022-01-29 ENCOUNTER — Other Ambulatory Visit (HOSPITAL_COMMUNITY): Payer: Self-pay

## 2022-02-01 ENCOUNTER — Other Ambulatory Visit (HOSPITAL_COMMUNITY): Payer: Self-pay

## 2022-02-22 ENCOUNTER — Other Ambulatory Visit (HOSPITAL_COMMUNITY): Payer: Self-pay

## 2022-03-02 ENCOUNTER — Other Ambulatory Visit (HOSPITAL_COMMUNITY): Payer: Self-pay

## 2022-03-14 ENCOUNTER — Other Ambulatory Visit (HOSPITAL_COMMUNITY): Payer: Self-pay

## 2022-03-28 ENCOUNTER — Other Ambulatory Visit (HOSPITAL_COMMUNITY): Payer: Self-pay

## 2022-03-30 ENCOUNTER — Other Ambulatory Visit (HOSPITAL_COMMUNITY): Payer: Self-pay

## 2022-04-19 ENCOUNTER — Other Ambulatory Visit (HOSPITAL_COMMUNITY): Payer: Self-pay

## 2022-05-01 ENCOUNTER — Other Ambulatory Visit (HOSPITAL_COMMUNITY): Payer: Self-pay

## 2022-05-02 ENCOUNTER — Other Ambulatory Visit (HOSPITAL_COMMUNITY): Payer: Self-pay

## 2022-05-04 ENCOUNTER — Other Ambulatory Visit (HOSPITAL_COMMUNITY): Payer: Self-pay

## 2022-06-01 ENCOUNTER — Other Ambulatory Visit (HOSPITAL_COMMUNITY): Payer: Self-pay

## 2022-06-04 ENCOUNTER — Other Ambulatory Visit (HOSPITAL_COMMUNITY): Payer: Self-pay

## 2022-06-15 ENCOUNTER — Encounter: Payer: Self-pay | Admitting: Internal Medicine

## 2022-06-18 MED ORDER — OLMESARTAN-AMLODIPINE-HCTZ 40-5-12.5 MG PO TABS: 1.0000 | ORAL_TABLET | Freq: Every day | ORAL | 0 refills | Status: DC

## 2022-07-05 ENCOUNTER — Other Ambulatory Visit: Payer: Self-pay | Admitting: Internal Medicine

## 2022-07-05 ENCOUNTER — Other Ambulatory Visit (HOSPITAL_COMMUNITY): Payer: Self-pay

## 2022-07-05 MED ORDER — POTASSIUM CHLORIDE ER 10 MEQ PO TBCR
EXTENDED_RELEASE_TABLET | ORAL | 1 refills | Status: DC
  Filled 2022-07-05: qty 60, 30d supply, fill #0
  Filled 2022-08-03: qty 60, 30d supply, fill #1
  Filled 2022-09-08: qty 60, 30d supply, fill #2
  Filled 2022-10-03: qty 60, 30d supply, fill #3

## 2022-07-05 MED ORDER — GABAPENTIN 300 MG PO CAPS
ORAL_CAPSULE | Freq: Three times a day (TID) | ORAL | 1 refills | Status: DC
  Filled 2022-07-05: qty 249, 83d supply, fill #0
  Filled 2022-07-05: qty 90, 30d supply, fill #0
  Filled 2022-08-03: qty 90, 30d supply, fill #1
  Filled 2022-09-08: qty 90, 30d supply, fill #2
  Filled 2022-10-03: qty 90, 30d supply, fill #3

## 2022-07-07 ENCOUNTER — Other Ambulatory Visit (HOSPITAL_COMMUNITY): Payer: Self-pay

## 2022-08-04 ENCOUNTER — Other Ambulatory Visit (HOSPITAL_COMMUNITY): Payer: Self-pay

## 2022-08-06 ENCOUNTER — Other Ambulatory Visit (HOSPITAL_COMMUNITY): Payer: Self-pay

## 2022-09-08 ENCOUNTER — Other Ambulatory Visit (HOSPITAL_COMMUNITY): Payer: Self-pay

## 2022-10-03 ENCOUNTER — Other Ambulatory Visit: Payer: Self-pay | Admitting: Internal Medicine

## 2022-10-03 DIAGNOSIS — Z1231 Encounter for screening mammogram for malignant neoplasm of breast: Secondary | ICD-10-CM

## 2022-10-04 ENCOUNTER — Other Ambulatory Visit (HOSPITAL_COMMUNITY): Payer: Self-pay

## 2022-10-12 ENCOUNTER — Telehealth: Payer: Self-pay | Admitting: Internal Medicine

## 2022-10-12 NOTE — Telephone Encounter (Signed)
Patient is requesting olmesartan/hctz tablet 40/12.5 - Patient is requesting 90 day with 3 refills.

## 2022-10-15 MED ORDER — OLMESARTAN-AMLODIPINE-HCTZ 40-5-12.5 MG PO TABS: 1.0000 | ORAL_TABLET | Freq: Every day | ORAL | 3 refills | Status: DC

## 2022-10-18 ENCOUNTER — Ambulatory Visit (INDEPENDENT_AMBULATORY_CARE_PROVIDER_SITE_OTHER): Payer: 59 | Admitting: *Deleted

## 2022-10-18 DIAGNOSIS — Z23 Encounter for immunization: Secondary | ICD-10-CM | POA: Diagnosis not present

## 2022-10-18 NOTE — Progress Notes (Signed)
Administered shingle shot left deltoid and regular flu shot right deltoid. Pt tolerated well.

## 2022-10-23 ENCOUNTER — Ambulatory Visit
Admission: RE | Admit: 2022-10-23 | Discharge: 2022-10-23 | Disposition: A | Discharge status: Home / Self Care | Payer: 59 | Source: Ambulatory Visit | Attending: Internal Medicine | Admitting: Internal Medicine

## 2022-10-23 DIAGNOSIS — Z1231 Encounter for screening mammogram for malignant neoplasm of breast: Secondary | ICD-10-CM

## 2022-11-08 ENCOUNTER — Encounter: Payer: 59 | Admitting: Internal Medicine

## 2022-11-09 ENCOUNTER — Other Ambulatory Visit (HOSPITAL_COMMUNITY): Payer: Self-pay

## 2022-11-09 ENCOUNTER — Ambulatory Visit (INDEPENDENT_AMBULATORY_CARE_PROVIDER_SITE_OTHER): Payer: 59 | Admitting: Internal Medicine

## 2022-11-09 VITALS — BP 128/80 | HR 61 | Temp 98.0°F | Ht 66.0 in | Wt 217.0 lb

## 2022-11-09 DIAGNOSIS — L918 Other hypertrophic disorders of the skin: Secondary | ICD-10-CM

## 2022-11-09 DIAGNOSIS — Z0001 Encounter for general adult medical examination with abnormal findings: Secondary | ICD-10-CM | POA: Diagnosis not present

## 2022-11-09 DIAGNOSIS — E538 Deficiency of other specified B group vitamins: Secondary | ICD-10-CM | POA: Diagnosis not present

## 2022-11-09 DIAGNOSIS — E559 Vitamin D deficiency, unspecified: Secondary | ICD-10-CM

## 2022-11-09 DIAGNOSIS — E1165 Type 2 diabetes mellitus with hyperglycemia: Secondary | ICD-10-CM | POA: Diagnosis not present

## 2022-11-09 DIAGNOSIS — I10 Essential (primary) hypertension: Secondary | ICD-10-CM

## 2022-11-09 DIAGNOSIS — E7800 Pure hypercholesterolemia, unspecified: Secondary | ICD-10-CM

## 2022-11-09 LAB — BASIC METABOLIC PANEL
BUN: 21 mg/dL (ref 6–23)
CO2: 32 mEq/L (ref 19–32)
Calcium: 9.1 mg/dL (ref 8.4–10.5)
Chloride: 105 mEq/L (ref 96–112)
Creatinine, Ser: 1.06 mg/dL (ref 0.40–1.20)
GFR: 58.98 mL/min — ABNORMAL LOW (ref >60.00)
Glucose, Bld: 112 mg/dL — ABNORMAL HIGH (ref 70–99)
Potassium: 3.6 mEq/L (ref 3.5–5.1)
Sodium: 141 mEq/L (ref 135–145)

## 2022-11-09 LAB — LIPID PANEL
Cholesterol: 124 mg/dL (ref 0–200)
HDL: 52 mg/dL (ref >39.00)
LDL Cholesterol: 63 mg/dL (ref 0–99)
NonHDL: 71.69
Total CHOL/HDL Ratio: 2
Triglycerides: 42 mg/dL (ref 0.0–149.0)
VLDL: 8.4 mg/dL (ref 0.0–40.0)

## 2022-11-09 LAB — CBC WITH DIFFERENTIAL/PLATELET
Basophils Absolute: 0 10*3/uL (ref 0.0–0.1)
Basophils Relative: 0.4 % (ref 0.0–3.0)
Eosinophils Absolute: 0.1 10*3/uL (ref 0.0–0.7)
Eosinophils Relative: 2.6 % (ref 0.0–5.0)
HCT: 39.2 % (ref 36.0–46.0)
Hemoglobin: 13 g/dL (ref 12.0–15.0)
Lymphocytes Relative: 58.6 % — ABNORMAL HIGH (ref 12.0–46.0)
Lymphs Abs: 2.1 10*3/uL (ref 0.7–4.0)
MCHC: 33.2 g/dL (ref 30.0–36.0)
MCV: 83.7 fl (ref 78.0–100.0)
Monocytes Absolute: 0.3 10*3/uL (ref 0.1–1.0)
Monocytes Relative: 8.6 % (ref 3.0–12.0)
Neutro Abs: 1.1 10*3/uL — ABNORMAL LOW (ref 1.4–7.7)
Neutrophils Relative %: 29.8 % — ABNORMAL LOW (ref 43.0–77.0)
Platelets: 218 10*3/uL (ref 150.0–400.0)
RBC: 4.68 Mil/uL (ref 3.87–5.11)
RDW: 13.5 % (ref 11.5–15.5)
WBC: 3.6 10*3/uL — ABNORMAL LOW (ref 4.0–10.5)

## 2022-11-09 LAB — URINALYSIS, ROUTINE W REFLEX MICROSCOPIC
Bilirubin Urine: NEGATIVE
Hgb urine dipstick: NEGATIVE
Ketones, ur: NEGATIVE
Leukocytes,Ua: NEGATIVE
Nitrite: NEGATIVE
Specific Gravity, Urine: 1.02 (ref 1.000–1.030)
Total Protein, Urine: 30 — AB
Urine Glucose: NEGATIVE
Urobilinogen, UA: 1 (ref 0.0–1.0)
pH: 7 (ref 5.0–8.0)

## 2022-11-09 LAB — HEPATIC FUNCTION PANEL
ALT: 28 U/L (ref 0–35)
AST: 24 U/L (ref 0–37)
Albumin: 4.2 g/dL (ref 3.5–5.2)
Alkaline Phosphatase: 78 U/L (ref 39–117)
Bilirubin, Direct: 0.1 mg/dL (ref 0.0–0.3)
Total Bilirubin: 0.5 mg/dL (ref 0.2–1.2)
Total Protein: 6.6 g/dL (ref 6.0–8.3)

## 2022-11-09 LAB — MICROALBUMIN / CREATININE URINE RATIO
Creatinine,U: 199.7 mg/dL
Microalb Creat Ratio: 3.8 mg/g (ref 0.0–30.0)
Microalb, Ur: 7.6 mg/dL — ABNORMAL HIGH (ref 0.0–1.9)

## 2022-11-09 LAB — VITAMIN D 25 HYDROXY (VIT D DEFICIENCY, FRACTURES): VITD: 32.38 ng/mL (ref 30.00–100.00)

## 2022-11-09 LAB — HEMOGLOBIN A1C: Hgb A1c MFr Bld: 6.4 % (ref 4.6–6.5)

## 2022-11-09 LAB — TSH: TSH: 1.33 u[IU]/mL (ref 0.35–5.50)

## 2022-11-09 LAB — VITAMIN B12: Vitamin B-12: 735 pg/mL (ref 211–911)

## 2022-11-09 MED ORDER — OLMESARTAN-AMLODIPINE-HCTZ 40-5-12.5 MG PO TABS
1.0000 | ORAL_TABLET | Freq: Every day | ORAL | 3 refills | Status: DC
  Filled 2022-11-09 – 2023-01-10 (×2): qty 30, 30d supply, fill #0
  Filled 2023-02-14: qty 30, 30d supply, fill #1
  Filled 2023-03-15: qty 30, 30d supply, fill #2
  Filled 2023-04-15 (×2): qty 30, 30d supply, fill #3
  Filled 2023-05-16: qty 30, 30d supply, fill #4
  Filled 2023-06-11: qty 30, 30d supply, fill #5
  Filled 2023-07-08: qty 30, 30d supply, fill #6
  Filled 2023-08-11: qty 30, 30d supply, fill #7
  Filled 2023-09-10: qty 30, 30d supply, fill #8

## 2022-11-09 MED ORDER — ROSUVASTATIN CALCIUM 40 MG PO TABS
40.0000 mg | ORAL_TABLET | Freq: Every day | ORAL | 3 refills | Status: DC
  Filled 2022-11-09 – 2022-11-29 (×2): qty 30, 30d supply, fill #0
  Filled 2023-01-10: qty 30, 30d supply, fill #1
  Filled 2023-02-14: qty 30, 30d supply, fill #2
  Filled 2023-03-15: qty 30, 30d supply, fill #3
  Filled 2023-04-15 (×2): qty 30, 30d supply, fill #4
  Filled 2023-05-16: qty 30, 30d supply, fill #5
  Filled 2023-06-11: qty 30, 30d supply, fill #6
  Filled 2023-07-08: qty 30, 30d supply, fill #7
  Filled 2023-08-11: qty 30, 30d supply, fill #8
  Filled 2023-09-10: qty 30, 30d supply, fill #9

## 2022-11-09 MED ORDER — POTASSIUM CHLORIDE ER 10 MEQ PO TBCR
EXTENDED_RELEASE_TABLET | ORAL | 3 refills | Status: DC
  Filled 2022-11-09 – 2022-11-29 (×2): qty 60, 30d supply, fill #0
  Filled 2023-01-10: qty 60, 30d supply, fill #1
  Filled 2023-02-14: qty 60, 30d supply, fill #2
  Filled 2023-03-15: qty 60, 30d supply, fill #3
  Filled 2023-04-15 (×2): qty 60, 30d supply, fill #4
  Filled 2023-07-08: qty 60, 30d supply, fill #5
  Filled 2023-08-11: qty 60, 30d supply, fill #6
  Filled 2023-09-10: qty 60, 30d supply, fill #7

## 2022-11-09 MED ORDER — GABAPENTIN 300 MG PO CAPS
ORAL_CAPSULE | Freq: Three times a day (TID) | ORAL | 1 refills | Status: DC
  Filled 2022-11-09: qty 90, 30d supply, fill #0

## 2022-11-09 NOTE — Patient Instructions (Signed)
Please continue all other medications as before, and refills have been done if requested.  Please have the pharmacy call with any other refills you may need.  Please continue your efforts at being more active, low cholesterol diet, and weight control.  You are otherwise up to date with prevention measures today.  Please keep your appointments with your specialists as you may have planned  You will be contacted regarding the referral for: Dermatology  Please go to the LAB at the blood drawing area for the tests to be done  You will be contacted by phone if any changes need to be made immediately.  Otherwise, you will receive a letter about your results with an explanation, but please check with MyChart first.  Please remember to sign up for MyChart if you have not done so, as this will be important to you in the future with finding out test results, communicating by private email, and scheduling acute appointments online when needed.  Please make an Appointment to return for your 1 year visit, or sooner if needed, with Lab testing by Appointment as well, to be done about 3-5 days before at the [facility] (so this is for TWO appointments - please see the scheduling desk as you leave)

## 2022-11-09 NOTE — Progress Notes (Unsigned)
Patient ID: Martha Alvarado, female   DOB: [birth year], 56 y.o.   MRN: [id number]         Chief Complaint:: wellness exam and skin tag, htn, hld, DM, low vit d       HPI:  Martha REZNICK is a 56 y.o. female here for wellness exam; declines covid booster, o/w up to date                        Also Pt denies chest pain, increased sob or doe, wheezing, orthopnea, PND, increased LE swelling, palpitations, dizziness or syncope.   Pt denies polydipsia, polyuria, or new focal neuro s/s.    Pt denies fever, wt loss, night sweats, loss of appetite, or other constitutional symptoms  Has several skin tags large and irritated, asking for removal.     Wt Readings from Last 3 Encounters:  11/09/22 217 lb (98.4 kg)  12/06/21 213 lb (96.6 kg)  09/07/21 217 lb (98.4 kg)   BP Readings from Last 3 Encounters:  11/09/22 128/80  12/06/21 132/70  09/07/21 124/70   Immunization History  Administered Date(s) Administered   Influenza,inj,Quad PF,6+ Mos 10/11/2020, 12/06/2021, 10/18/2022   PFIZER(Purple Top)SARS-COV-2 Vaccination 12/30/2019, 01/18/2020, 12/19/2020, 10/17/2021   Td 04/26/2009   Tdap 06/25/2019   Zoster Recombinat (Shingrix) 10/18/2022   There are no preventive care reminders to display for this patient.     Past Medical History:  Diagnosis Date   CHLAMYDIAL INFECTION, HX OF 09/13/2007   Hyperlipidemia 05/20/2015   HYPERTENSION 09/13/2007   Impaired glucose tolerance 07/08/2011   SYPHILIS 09/13/2007   Past Surgical History:  Procedure Laterality Date   LUMBAR LAMINECTOMY N/A 10/10/2020   Procedure: RIGHT L5-S1 MICRODISCECTOMY LUMBAR LAMINECTOMY;  Surgeon: Marybelle Killings, MD;  Location: [facility];  Service: Orthopedics;  Laterality: N/A;   TUBAL LIGATION  1991    reports that she has never smoked. She has never used smokeless tobacco. She reports that she does not drink alcohol and does not use drugs. family history includes Diabetes in her mother and sister; Hypertension in her sister and  sister. No Known Allergies Current Outpatient Medications on File Prior to Visit  Medication Sig Dispense Refill   aspirin EC 81 MG tablet Take 81 mg by mouth daily. Swallow whole.     Cholecalciferol 50 MCG (2000 UT) TABS 1 tab by mouth once daily 30 tablet 99   No current facility-administered medications on file prior to visit.        ROS:  All others reviewed and negative.  Objective        PE:  BP 128/80 (BP Location: Left Arm, Patient Position: Sitting, Cuff Size: Large)   Pulse 61   Temp 98 F (36.7 C) (Oral)   Ht '5\' 6"'$  (1.676 m)   Wt 217 lb (98.4 kg)   SpO2 98%   BMI 35.02 kg/m                 Constitutional: Pt appears in NAD               HENT: Head: NCAT.                Right Ear: External ear normal.                 Left Ear: External ear normal.                Eyes: . Pupils are equal, round, and  reactive to light. Conjunctivae and EOM are normal               Nose: without d/c or deformity               Neck: Neck supple. Gross normal ROM               Cardiovascular: Normal rate and regular rhythm.                 Pulmonary/Chest: Effort normal and breath sounds without rales or wheezing.                Abd:  Soft, NT, ND, + BS, no organomegaly               Neurological: Pt is alert. At baseline orientation, motor grossly intact               Skin: Skin is warm. No rashes, no other new lesions, LE edema - none               Psychiatric: Pt behavior is normal without agitation   Micro: none  Cardiac tracings I have personally interpreted today:  none  Pertinent Radiological findings (summarize): none   Lab Results  Component Value Date   WBC 3.6 (L) 11/09/2022   HGB 13.0 11/09/2022   HCT 39.2 11/09/2022   PLT 218.0 11/09/2022   GLUCOSE 112 (H) 11/09/2022   CHOL 124 11/09/2022   TRIG 42.0 11/09/2022   HDL 52.00 11/09/2022   LDLCALC 63 11/09/2022   ALT 28 11/09/2022   AST 24 11/09/2022   NA 141 11/09/2022   K 3.6 11/09/2022   CL 105 11/09/2022    CREATININE 1.06 11/09/2022   BUN 21 11/09/2022   CO2 32 11/09/2022   TSH 1.33 11/09/2022   HGBA1C 6.4 11/09/2022   MICROALBUR 7.6 (H) 11/09/2022   Assessment/Plan:  Martha Alvarado is a 56 y.o. Black or African American [2] female with  has a past medical history of CHLAMYDIAL INFECTION, HX OF (09/13/2007), Hyperlipidemia (05/20/2015), HYPERTENSION (09/13/2007), Impaired glucose tolerance (07/08/2011), and SYPHILIS (09/13/2007).  Encounter for well adult exam with abnormal findings Age and sex appropriate education and counseling updated with regular exercise and diet Referrals for preventative services - none needed Immunizations addressed - declines covid booster Smoking counseling  - none needed Evidence for depression or other mood disorder - none significant Most recent labs reviewed. I have personally reviewed and have noted: 1) the patient's medical and social history 2) The patient's current medications and supplements 3) The patient's height, weight, and BMI have been recorded in the chart   Vitamin D deficiency Last vitamin D Lab Results  Component Value Date   VD25OH 32.38 11/09/2022   Low, to start oral replacement   Hypertension, uncontrolled BP Readings from Last 3 Encounters:  11/09/22 128/80  12/06/21 132/70  09/07/21 124/70   Stable, pt to continue medical treatment tribenzor 40-5-12.5 mg qd   Hyperlipidemia Lab Results  Component Value Date   LDLCALC 63 11/09/2022   Stable, pt to continue current statin crestor 40 mg qd   Diabetes ([place]) Lab Results  Component Value Date   HGBA1C 6.4 11/09/2022   Stable, pt to continue current medical treatment  - diet, wt control, excercise   Skin tag Also for dermatology referral  Followup: Return in about 1 year (around 11/10/2023).  Martha Cower, MD 11/11/2022 3:28 PM [facility]

## 2022-11-11 ENCOUNTER — Encounter: Payer: Self-pay | Admitting: Internal Medicine

## 2022-11-11 DIAGNOSIS — L918 Other hypertrophic disorders of the skin: Secondary | ICD-10-CM | POA: Insufficient documentation

## 2022-11-11 NOTE — Assessment & Plan Note (Signed)
Also for dermatology referral

## 2022-11-11 NOTE — Assessment & Plan Note (Signed)
Last vitamin D Lab Results  Component Value Date   VD25OH 32.38 11/09/2022   Low, to start oral replacement

## 2022-11-11 NOTE — Assessment & Plan Note (Signed)
Lab Results  Component Value Date   LDLCALC 63 11/09/2022   Stable, pt to continue current statin crestor 40 mg qd

## 2022-11-11 NOTE — Assessment & Plan Note (Signed)
Lab Results  Component Value Date   HGBA1C 6.4 11/09/2022   Stable, pt to continue current medical treatment  - diet, wt control, excercise

## 2022-11-11 NOTE — Assessment & Plan Note (Signed)
BP Readings from Last 3 Encounters:  11/09/22 128/80  12/06/21 132/70  09/07/21 124/70   Stable, pt to continue medical treatment tribenzor 40-5-12.5 mg qd

## 2022-11-11 NOTE — Assessment & Plan Note (Signed)

## 2022-11-19 NOTE — Progress Notes (Signed)
56 y.o. G16P2002 Married Black or [place] American Not Hispanic or Latino female here for annual exam. No vaginal bleeding. No dyspareunia.     No LMP recorded. Patient is postmenopausal.          Sexually active: Yes.    The current method of family planning is post menopausal status.    Exercising: Yes.     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   TDaP:  06/25/2019 Gardasil: n/a   reports that she has never smoked. She has never used smokeless tobacco. She reports that she does not currently use drugs. She reports that she does not drink alcohol.  Works in dialysis. Son is 68, daughter is 74. Yolanda Bonine is 7. Everyone is local.    Past Medical History:  Diagnosis Date   CHLAMYDIAL INFECTION, HX OF 09/13/2007   Hyperlipidemia 05/20/2015   HYPERTENSION 09/13/2007   Impaired glucose tolerance 07/08/2011   SYPHILIS 09/13/2007    Past Surgical History:  Procedure Laterality Date   LUMBAR LAMINECTOMY N/A 10/10/2020   Procedure: RIGHT L5-S1 MICRODISCECTOMY LUMBAR LAMINECTOMY;  Surgeon: Marybelle Killings, MD;  Location: [facility];  Service: Orthopedics;  Laterality: N/A;   TUBAL LIGATION  1991    Current Outpatient Medications  Medication Sig Dispense Refill   aspirin EC 81 MG tablet Take 81 mg by mouth daily. Swallow whole.     Cholecalciferol 50 MCG (2000 UT) TABS 1 tab by mouth once daily 30 tablet 99   gabapentin (NEURONTIN) 300 MG capsule TAKE 1 CAPSULE BY MOUTH 3 TIMES DAILY 270 capsule 1   Olmesartan-amLODIPine-HCTZ 40-5-12.5 MG TABS TAKE 1 TABLET BY MOUTH ONCE A DAY 90 tablet 3   potassium chloride (KLOR-CON) 10 MEQ tablet TAKE 1 TABLET BY MOUTH EVERY MORNING AND EVERY NIGHT AT BEDTIME 180 tablet 3   rosuvastatin (CRESTOR) 40 MG tablet Take 1 tablet (40 mg total) by mouth daily. 90  tablet 3   No current facility-administered medications for this visit.    Family History  Problem Relation Age of Onset   Diabetes Mother    Diabetes Sister    Hypertension Sister    Hypertension Sister    Colon cancer Neg Hx    Esophageal cancer Neg Hx    Rectal cancer Neg Hx    Stomach cancer Neg Hx     Review of Systems  All other systems reviewed and are negative.   Exam:   BP 110/82   Pulse 65   Ht '5\' 4"'$  (1.626 m)   Wt 217 lb (98.4 kg)   SpO2 98%   BMI 37.25 kg/m   Weight change: '@WEIGHTCHANGE'$ @ Height:   Height: '5\' 4"'$  (162.6 cm)  Ht Readings from Last 3 Encounters:  11/28/22 '5\' 4"'$  (1.626 m)  11/09/22 '5\' 6"'$  (1.676 m)  12/06/21 '5\' 6"'$  (1.676 m)    General appearance: alert, cooperative and appears stated age Head: Normocephalic, without obvious abnormality, atraumatic Neck: no adenopathy, supple, symmetrical, trachea midline and thyroid normal to inspection and palpation Lungs: clear to auscultation bilaterally Cardiovascular: regular rate and rhythm Breasts: normal appearance, no masses or tenderness Abdomen: soft, non-tender; non distended,  no masses,  no organomegaly Extremities: extremities normal, atraumatic, no cyanosis or edema Skin: Skin color, texture, turgor  normal. No rashes or lesions Lymph nodes: Cervical, supraclavicular, and axillary nodes normal. No abnormal inguinal nodes palpated Neurologic: Grossly normal   Pelvic: External genitalia:  no lesions              Urethra:  normal appearing urethra with no masses, tenderness or lesions              Bartholins and Skenes: normal                 Vagina: normal appearing vagina with normal color and discharge, no lesions              Cervix: no lesions               Bimanual Exam:  Uterus:  normal size, contour, position, consistency, mobility, non-tender and anteverted              Adnexa: no mass, fullness, tenderness               Rectovaginal: Confirms               Anus:  normal sphincter  tone, no lesions  Glorianne Manchester, RN chaperoned for the exam.  1. Well woman exam Discussed breast self exam Discussed calcium and vit D intake Mammogram and colonoscopy are UTD Labs with primary  2. Screening for cervical cancer - Cytology - PAP

## 2022-11-21 ENCOUNTER — Other Ambulatory Visit (HOSPITAL_COMMUNITY): Payer: Self-pay

## 2022-11-28 ENCOUNTER — Other Ambulatory Visit (HOSPITAL_COMMUNITY)
Admission: RE | Admit: 2022-11-28 | Discharge: 2022-11-28 | Disposition: A | Discharge status: Home / Self Care | Payer: 59 | Source: Ambulatory Visit | Attending: Obstetrics and Gynecology | Admitting: Obstetrics and Gynecology

## 2022-11-28 ENCOUNTER — Encounter: Payer: Self-pay | Admitting: Obstetrics and Gynecology

## 2022-11-28 ENCOUNTER — Ambulatory Visit (INDEPENDENT_AMBULATORY_CARE_PROVIDER_SITE_OTHER): Payer: 59 | Admitting: Obstetrics and Gynecology

## 2022-11-28 ENCOUNTER — Other Ambulatory Visit: Payer: Self-pay

## 2022-11-28 VITALS — BP 110/82 | HR 65 | Ht 64.0 in | Wt 217.0 lb

## 2022-11-28 DIAGNOSIS — Z124 Encounter for screening for malignant neoplasm of cervix: Secondary | ICD-10-CM

## 2022-11-28 DIAGNOSIS — Z01419 Encounter for gynecological examination (general) (routine) without abnormal findings: Secondary | ICD-10-CM | POA: Diagnosis not present

## 2022-11-28 NOTE — Patient Instructions (Signed)

## 2022-11-29 LAB — CYTOLOGY - PAP
Comment: NEGATIVE
Diagnosis: NEGATIVE
High risk HPV: NEGATIVE

## 2022-11-30 ENCOUNTER — Other Ambulatory Visit (HOSPITAL_COMMUNITY): Payer: Self-pay

## 2022-12-19 ENCOUNTER — Ambulatory Visit: Payer: 59

## 2022-12-21 ENCOUNTER — Ambulatory Visit (INDEPENDENT_AMBULATORY_CARE_PROVIDER_SITE_OTHER): Payer: 59

## 2022-12-21 DIAGNOSIS — Z23 Encounter for immunization: Secondary | ICD-10-CM

## 2023-01-10 ENCOUNTER — Other Ambulatory Visit (HOSPITAL_COMMUNITY): Payer: Self-pay

## 2023-01-11 ENCOUNTER — Other Ambulatory Visit: Payer: Self-pay

## 2023-01-11 ENCOUNTER — Other Ambulatory Visit (HOSPITAL_COMMUNITY): Payer: Self-pay

## 2023-03-16 ENCOUNTER — Other Ambulatory Visit (HOSPITAL_COMMUNITY): Payer: Self-pay

## 2023-04-15 ENCOUNTER — Other Ambulatory Visit (HOSPITAL_COMMUNITY): Payer: Self-pay

## 2023-05-16 ENCOUNTER — Other Ambulatory Visit: Payer: Self-pay

## 2023-05-16 ENCOUNTER — Other Ambulatory Visit (HOSPITAL_COMMUNITY): Payer: Self-pay

## 2023-06-03 ENCOUNTER — Other Ambulatory Visit (HOSPITAL_COMMUNITY): Payer: Self-pay

## 2023-06-03 MED ORDER — CELECOXIB 200 MG PO CAPS
200.0000 mg | ORAL_CAPSULE | Freq: Every day | ORAL | 0 refills | Status: DC
  Filled 2023-06-03: qty 30, 30d supply, fill #0

## 2023-06-03 MED ORDER — CYCLOBENZAPRINE HCL 10 MG PO TABS
10.0000 mg | ORAL_TABLET | Freq: Every evening | ORAL | 0 refills | Status: DC
  Filled 2023-06-03: qty 30, 30d supply, fill #0

## 2023-06-12 ENCOUNTER — Other Ambulatory Visit (HOSPITAL_COMMUNITY): Payer: Self-pay

## 2023-06-13 ENCOUNTER — Other Ambulatory Visit (HOSPITAL_COMMUNITY): Payer: Self-pay

## 2023-07-01 ENCOUNTER — Other Ambulatory Visit (HOSPITAL_COMMUNITY): Payer: Self-pay

## 2023-07-01 MED ORDER — CYCLOBENZAPRINE HCL 10 MG PO TABS
10.0000 mg | ORAL_TABLET | Freq: Every evening | ORAL | 0 refills | Status: DC
  Filled 2023-07-01: qty 30, 30d supply, fill #0

## 2023-07-01 MED ORDER — CELECOXIB 200 MG PO CAPS
200.0000 mg | ORAL_CAPSULE | Freq: Every day | ORAL | 0 refills | Status: DC
  Filled 2023-07-01: qty 30, 30d supply, fill #0

## 2023-07-08 ENCOUNTER — Other Ambulatory Visit: Payer: Self-pay

## 2023-07-10 ENCOUNTER — Other Ambulatory Visit (HOSPITAL_COMMUNITY): Payer: Self-pay

## 2023-08-11 ENCOUNTER — Other Ambulatory Visit: Payer: Self-pay

## 2023-08-31 ENCOUNTER — Other Ambulatory Visit (HOSPITAL_COMMUNITY): Payer: Self-pay

## 2023-08-31 MED ORDER — CYCLOBENZAPRINE HCL 10 MG PO TABS
10.0000 mg | ORAL_TABLET | Freq: Every day | ORAL | 0 refills | Status: DC
  Filled 2023-08-31: qty 30, 30d supply, fill #0

## 2023-08-31 MED ORDER — CELECOXIB 200 MG PO CAPS
200.0000 mg | ORAL_CAPSULE | Freq: Every day | ORAL | 0 refills | Status: DC
  Filled 2023-08-31: qty 30, 30d supply, fill #0

## 2023-09-03 ENCOUNTER — Other Ambulatory Visit (HOSPITAL_COMMUNITY): Payer: Self-pay

## 2023-09-10 ENCOUNTER — Other Ambulatory Visit (HOSPITAL_COMMUNITY): Payer: Self-pay

## 2023-09-24 ENCOUNTER — Other Ambulatory Visit: Payer: Self-pay | Admitting: Internal Medicine

## 2023-09-24 DIAGNOSIS — Z1231 Encounter for screening mammogram for malignant neoplasm of breast: Secondary | ICD-10-CM

## 2023-10-07 ENCOUNTER — Ambulatory Visit: Payer: 59 | Admitting: Internal Medicine

## 2023-10-07 ENCOUNTER — Other Ambulatory Visit (HOSPITAL_COMMUNITY): Payer: Self-pay

## 2023-10-07 ENCOUNTER — Encounter: Payer: Self-pay | Admitting: Internal Medicine

## 2023-10-07 VITALS — BP 122/74 | HR 79 | Temp 98.1°F | Ht 64.0 in | Wt 227.0 lb

## 2023-10-07 DIAGNOSIS — M25461 Effusion, right knee: Secondary | ICD-10-CM

## 2023-10-07 DIAGNOSIS — Z0000 Encounter for general adult medical examination without abnormal findings: Secondary | ICD-10-CM

## 2023-10-07 DIAGNOSIS — E7800 Pure hypercholesterolemia, unspecified: Secondary | ICD-10-CM | POA: Diagnosis not present

## 2023-10-07 DIAGNOSIS — E1165 Type 2 diabetes mellitus with hyperglycemia: Secondary | ICD-10-CM

## 2023-10-07 DIAGNOSIS — Z7985 Long-term (current) use of injectable non-insulin antidiabetic drugs: Secondary | ICD-10-CM

## 2023-10-07 DIAGNOSIS — Z23 Encounter for immunization: Secondary | ICD-10-CM

## 2023-10-07 DIAGNOSIS — M25462 Effusion, left knee: Secondary | ICD-10-CM | POA: Diagnosis not present

## 2023-10-07 DIAGNOSIS — E538 Deficiency of other specified B group vitamins: Secondary | ICD-10-CM

## 2023-10-07 DIAGNOSIS — E559 Vitamin D deficiency, unspecified: Secondary | ICD-10-CM | POA: Diagnosis not present

## 2023-10-07 DIAGNOSIS — Z0001 Encounter for general adult medical examination with abnormal findings: Secondary | ICD-10-CM

## 2023-10-07 DIAGNOSIS — I10 Essential (primary) hypertension: Secondary | ICD-10-CM

## 2023-10-07 LAB — URINALYSIS, ROUTINE W REFLEX MICROSCOPIC
Bilirubin Urine: NEGATIVE
Hgb urine dipstick: NEGATIVE
Ketones, ur: NEGATIVE
Leukocytes,Ua: NEGATIVE
Nitrite: NEGATIVE
RBC / HPF: NONE SEEN (ref 0–?)
Specific Gravity, Urine: 1.015 (ref 1.000–1.030)
Urine Glucose: 250 — AB
Urobilinogen, UA: 0.2 (ref 0.0–1.0)
pH: 6 (ref 5.0–8.0)

## 2023-10-07 LAB — HEPATIC FUNCTION PANEL
ALT: 20 U/L (ref 0–35)
AST: 16 U/L (ref 0–37)
Albumin: 4 g/dL (ref 3.5–5.2)
Alkaline Phosphatase: 96 U/L (ref 39–117)
Bilirubin, Direct: 0.1 mg/dL (ref 0.0–0.3)
Total Bilirubin: 0.6 mg/dL (ref 0.2–1.2)
Total Protein: 6.4 g/dL (ref 6.0–8.3)

## 2023-10-07 LAB — CBC WITH DIFFERENTIAL/PLATELET
Basophils Absolute: 0 10*3/uL (ref 0.0–0.1)
Basophils Relative: 0.3 % (ref 0.0–3.0)
Eosinophils Absolute: 0.1 10*3/uL (ref 0.0–0.7)
Eosinophils Relative: 1.9 % (ref 0.0–5.0)
HCT: 40.9 % (ref 36.0–46.0)
Hemoglobin: 13.3 g/dL (ref 12.0–15.0)
Lymphocytes Relative: 42.6 % (ref 12.0–46.0)
Lymphs Abs: 1.8 10*3/uL (ref 0.7–4.0)
MCHC: 32.6 g/dL (ref 30.0–36.0)
MCV: 84.1 fL (ref 78.0–100.0)
Monocytes Absolute: 0.2 10*3/uL (ref 0.1–1.0)
Monocytes Relative: 5.3 % (ref 3.0–12.0)
Neutro Abs: 2.2 10*3/uL (ref 1.4–7.7)
Neutrophils Relative %: 49.9 % (ref 43.0–77.0)
Platelets: 244 10*3/uL (ref 150.0–400.0)
RBC: 4.86 Mil/uL (ref 3.87–5.11)
RDW: 13.3 % (ref 11.5–15.5)
WBC: 4.3 10*3/uL (ref 4.0–10.5)

## 2023-10-07 LAB — BASIC METABOLIC PANEL
BUN: 19 mg/dL (ref 6–23)
CO2: 32 meq/L (ref 19–32)
Calcium: 9.3 mg/dL (ref 8.4–10.5)
Chloride: 101 meq/L (ref 96–112)
Creatinine, Ser: 1.18 mg/dL (ref 0.40–1.20)
GFR: 51.53 mL/min — ABNORMAL LOW (ref >60.00)
Glucose, Bld: 197 mg/dL — ABNORMAL HIGH (ref 70–99)
Potassium: 3.5 meq/L (ref 3.5–5.1)
Sodium: 141 meq/L (ref 135–145)

## 2023-10-07 LAB — LIPID PANEL
Cholesterol: 125 mg/dL (ref 0–200)
HDL: 54.9 mg/dL (ref >39.00)
LDL Cholesterol: 52 mg/dL (ref 0–99)
NonHDL: 69.87
Total CHOL/HDL Ratio: 2
Triglycerides: 88 mg/dL (ref 0.0–149.0)
VLDL: 17.6 mg/dL (ref 0.0–40.0)

## 2023-10-07 LAB — MICROALBUMIN / CREATININE URINE RATIO
Creatinine,U: 115 mg/dL
Microalb Creat Ratio: 3.8 mg/g (ref 0.0–30.0)
Microalb, Ur: 4.3 mg/dL — ABNORMAL HIGH (ref 0.0–1.9)

## 2023-10-07 LAB — VITAMIN D 25 HYDROXY (VIT D DEFICIENCY, FRACTURES): VITD: 29.4 ng/mL — ABNORMAL LOW (ref 30.00–100.00)

## 2023-10-07 LAB — VITAMIN B12: Vitamin B-12: 422 pg/mL (ref 211–911)

## 2023-10-07 LAB — TSH: TSH: 2.14 u[IU]/mL (ref 0.35–5.50)

## 2023-10-07 LAB — HEMOGLOBIN A1C: Hgb A1c MFr Bld: 6.6 % — ABNORMAL HIGH (ref 4.6–6.5)

## 2023-10-07 MED ORDER — OLMESARTAN-AMLODIPINE-HCTZ 40-5-12.5 MG PO TABS
1.0000 | ORAL_TABLET | Freq: Every day | ORAL | 3 refills | Status: DC
  Filled 2023-10-07: qty 30, 30d supply, fill #0
  Filled 2023-11-14: qty 30, 30d supply, fill #1
  Filled 2023-12-13: qty 30, 30d supply, fill #2
  Filled 2024-01-11: qty 30, 30d supply, fill #3
  Filled 2024-02-09: qty 30, 30d supply, fill #4
  Filled 2024-03-09: qty 30, 30d supply, fill #5
  Filled 2024-04-14: qty 30, 30d supply, fill #6
  Filled 2024-05-10: qty 30, 30d supply, fill #7
  Filled 2024-06-06: qty 30, 30d supply, fill #8
  Filled 2024-07-10: qty 30, 30d supply, fill #9
  Filled 2024-08-11: qty 30, 30d supply, fill #10
  Filled 2024-09-09: qty 30, 30d supply, fill #11

## 2023-10-07 MED ORDER — TIRZEPATIDE 2.5 MG/0.5ML ~~LOC~~ SOAJ
2.5000 mg | SUBCUTANEOUS | 11 refills | Status: DC
  Filled 2023-10-07: qty 2, 28d supply, fill #0
  Filled 2023-12-13: qty 2, 28d supply, fill #1
  Filled 2024-01-11: qty 2, 28d supply, fill #2
  Filled 2024-03-09: qty 2, 28d supply, fill #3
  Filled 2024-04-14: qty 2, 28d supply, fill #4
  Filled 2024-05-10: qty 2, 28d supply, fill #5
  Filled 2024-06-06: qty 2, 28d supply, fill #6
  Filled 2024-07-10: qty 2, 28d supply, fill #7
  Filled 2024-08-11: qty 2, 28d supply, fill #8
  Filled 2024-09-09 – 2024-09-29 (×2): qty 2, 28d supply, fill #9

## 2023-10-07 MED ORDER — ROSUVASTATIN CALCIUM 40 MG PO TABS
40.0000 mg | ORAL_TABLET | Freq: Every day | ORAL | 3 refills | Status: DC
  Filled 2023-10-07: qty 30, 30d supply, fill #0
  Filled 2023-11-14: qty 30, 30d supply, fill #1
  Filled 2023-12-13: qty 30, 30d supply, fill #2
  Filled 2024-01-11: qty 30, 30d supply, fill #3
  Filled 2024-02-09: qty 30, 30d supply, fill #4
  Filled 2024-03-09: qty 30, 30d supply, fill #5
  Filled 2024-04-14: qty 30, 30d supply, fill #6
  Filled 2024-05-10: qty 30, 30d supply, fill #7
  Filled 2024-06-06: qty 30, 30d supply, fill #8
  Filled 2024-07-10: qty 30, 30d supply, fill #9
  Filled 2024-08-11: qty 30, 30d supply, fill #10
  Filled 2024-09-09: qty 30, 30d supply, fill #11

## 2023-10-07 MED ORDER — TRAMADOL HCL 50 MG PO TABS
50.0000 mg | ORAL_TABLET | Freq: Four times a day (QID) | ORAL | 0 refills | Status: DC | PRN
  Filled 2023-10-07: qty 30, 7d supply, fill #0

## 2023-10-07 MED ORDER — POTASSIUM CHLORIDE ER 10 MEQ PO TBCR
EXTENDED_RELEASE_TABLET | ORAL | 3 refills | Status: DC
  Filled 2023-10-07: qty 60, 30d supply, fill #0
  Filled 2023-12-31: qty 60, 30d supply, fill #1
  Filled 2024-02-09: qty 60, 30d supply, fill #2
  Filled 2024-03-09: qty 60, 30d supply, fill #3
  Filled 2024-04-14: qty 60, 30d supply, fill #4
  Filled 2024-05-10: qty 60, 30d supply, fill #5
  Filled 2024-07-10: qty 60, 30d supply, fill #6
  Filled 2024-08-11: qty 60, 30d supply, fill #7
  Filled 2024-09-09: qty 60, 30d supply, fill #8

## 2023-10-07 NOTE — Progress Notes (Unsigned)
Patient ID: Martha Alvarado, female   DOB: 20-Mar-1966, 57 y.o.   MRN: [id number]         Chief Complaint:: wellness exam and Knee Pain (Has been going on for 2 months swelling and pain for the past 2 months everyday )  , dm, htn, hld, low vit d       HPI:  Martha Alvarado is a 57 y.o. female here for wellness exam; o/w up to date                Also Gained 10 lbs with less active due to left > right knee pain now with also large left knee effusion.  Hard to lose wt  .Pt denies chest pain, increased sob or doe, wheezing, orthopnea, PND, increased LE swelling, palpitations, dizziness or syncope.   Pt denies polydipsia, polyuria, or new focal neuro s/s.    Pt denies fever, wt loss, night sweats, loss of appetite, or other constitutional symptoms    Wt Readings from Last 3 Encounters:  10/09/23 232 lb (105.2 kg)  10/07/23 227 lb (103 kg)  11/28/22 217 lb (98.4 kg)   BP Readings from Last 3 Encounters:  10/09/23 126/82  10/07/23 122/74  11/28/22 110/82   Immunization History  Administered Date(s) Administered   Influenza, Seasonal, Injecte, Preservative Fre 10/07/2023   Influenza,inj,Quad PF,6+ Mos 10/11/2020, 12/06/2021, 10/18/2022   PFIZER(Purple Top)SARS-COV-2 Vaccination 12/30/2019, 01/18/2020, 12/19/2020, 10/17/2021   Td 04/26/2009   Tdap 06/25/2019   Zoster Recombinant(Shingrix) 10/18/2022, 12/21/2022   Health Maintenance Due  Topic Date Due   OPHTHALMOLOGY EXAM  05/01/2023      Past Medical History:  Diagnosis Date   CHLAMYDIAL INFECTION, HX OF 09/13/2007   Hyperlipidemia 05/20/2015   HYPERTENSION 09/13/2007   Impaired glucose tolerance 07/08/2011   SYPHILIS 09/13/2007   Past Surgical History:  Procedure Laterality Date   LUMBAR LAMINECTOMY N/A 10/10/2020   Procedure: RIGHT L5-S1 MICRODISCECTOMY LUMBAR LAMINECTOMY;  Surgeon: Eldred Manges, MD;  Location: [facility];  Service: Orthopedics;  Laterality: N/A;   TUBAL LIGATION  1991    reports that she has never smoked. She has  never used smokeless tobacco. She reports that she does not currently use drugs. She reports that she does not drink alcohol. family history includes Diabetes in her mother and sister; Hypertension in her sister and sister. No Known Allergies Current Outpatient Medications on File Prior to Visit  Medication Sig Dispense Refill   aspirin EC 81 MG tablet Take 81 mg by mouth daily. Swallow whole.     celecoxib (CELEBREX) 200 MG capsule Take 1 capsule (200 mg total) by mouth daily. 90 capsule 0   Cholecalciferol 50 MCG (2000 UT) TABS 1 tab by mouth once daily 30 tablet 99   gabapentin (NEURONTIN) 300 MG capsule TAKE 1 CAPSULE BY MOUTH 3 TIMES DAILY 270 capsule 1   No current facility-administered medications on file prior to visit.        ROS:  All others reviewed and negative.  Objective        PE:  BP 122/74 (BP Location: Right Arm, Patient Position: Sitting, Cuff Size: Normal)   Pulse 79   Temp 98.1 F (36.7 C) (Oral)   Ht 5\' 4"  (1.626 m)   Wt 227 lb (103 kg)   SpO2 99%   BMI 38.96 kg/m                 Constitutional: Pt appears in NAD  HENT: Head: NCAT.                Right Ear: External ear normal.                 Left Ear: External ear normal.                Eyes: . Pupils are equal, round, and reactive to light. Conjunctivae and EOM are normal               Nose: without d/c or deformity               Neck: Neck supple. Gross normal ROM               Cardiovascular: Normal rate and regular rhythm.                 Pulmonary/Chest: Effort normal and breath sounds without rales or wheezing.                Abd:  Soft, NT, ND, + BS, no organomegaly               Neurological: Pt is alert. At baseline orientation, motor grossly intact               Skin: Skin is warm. No rashes, no other new lesions, LE edema - none                Left knee with mod to large effusion, diffuse mild tender               Psychiatric: Pt behavior is normal without agitation   Micro:  none  Cardiac tracings I have personally interpreted today:  none  Pertinent Radiological findings (summarize): none   Lab Results  Component Value Date   WBC 4.3 10/07/2023   HGB 13.3 10/07/2023   HCT 40.9 10/07/2023   PLT 244.0 10/07/2023   GLUCOSE 197 (H) 10/07/2023   CHOL 125 10/07/2023   TRIG 88.0 10/07/2023   HDL 54.90 10/07/2023   LDLCALC 52 10/07/2023   ALT 20 10/07/2023   AST 16 10/07/2023   NA 141 10/07/2023   K 3.5 10/07/2023   CL 101 10/07/2023   CREATININE 1.18 10/07/2023   BUN 19 10/07/2023   CO2 32 10/07/2023   TSH 2.14 10/07/2023   HGBA1C 6.6 (H) 10/07/2023   MICROALBUR 4.3 (H) 10/07/2023   Assessment/Plan:  Martha Alvarado is a 57 y.o. Black or African American [2] female with  has a past medical history of CHLAMYDIAL INFECTION, HX OF (09/13/2007), Hyperlipidemia (05/20/2015), HYPERTENSION (09/13/2007), Impaired glucose tolerance (07/08/2011), and SYPHILIS (09/13/2007).  Encounter for well adult exam with abnormal findings Age and sex appropriate education and counseling updated with regular exercise and diet Referrals for preventative services - none needed Immunizations addressed - none needed Smoking counseling  - none needed Evidence for depression or other mood disorder - none significant Most recent labs reviewed. I have personally reviewed and have noted: 1) the patient's medical and social history 2) The patient's current medications and supplements 3) The patient's height, weight, and BMI have been recorded in the chart   Hypertension, uncontrolled BP Readings from Last 3 Encounters:  10/09/23 126/82  10/07/23 122/74  11/28/22 110/82   Stable, pt to continue medical treatment tribenzor 40 5 12.5 qd   Diabetes ([facility]) Lab Results  Component Value Date   HGBA1C 6.6 (H) 10/07/2023   uncontrolled, pt to start medical treatment start mounjaro  2.5 weekly, also refer optho    Hyperlipidemia Lab Results  Component Value Date   LDLCALC 52  10/07/2023   Stable, pt to continue current statin crestor 40 qd   Vitamin D deficiency Last vitamin D Lab Results  Component Value Date   VD25OH 29.40 (L) 10/07/2023   Low, to start oral replacement   Bilateral knee effusions With left > rght mod to severe pain  - for tramadol prn, refer sport medicine Followup: Return in about 6 months (around 04/06/2024).  Oliver Barre, MD 10/09/2023 9:17 PM [facility]

## 2023-10-07 NOTE — Progress Notes (Signed)
The test results show that your current treatment is OK, as the tests are stable.  Please continue the same plan.  There is no other need for change of treatment or further evaluation based on these results, at this time.  thanks 

## 2023-10-07 NOTE — Patient Instructions (Addendum)
Please take all new medication as prescribed - the mounjaro 2.5 mg weekly - and call in 1 month for the next higher dose if you are taking ok, for the next higher dose  Please take all new medication as prescribed - the tramadol for pain as needed;  ok to stop the flexeril muscle relaxer  You can still take the celebrex as needed, and also topical Voltaren gel for pain as well as needed  Please continue all other medications as before, and refills have been done if requested.  Please have the pharmacy call with any other refills you may need.  Please continue your efforts at being more active, low cholesterol diet, and weight control.  You are otherwise up to date with prevention measures today.  Please keep your appointments with your specialists as you may have planned  Please stop at the first floor for Appt with Sports Medicine  Please let me know if you need a work note  You will be contacted regarding the referral for: also for Eye doctor (ophthalmology)  Please go to the LAB at the blood drawing area for the tests to be done  You will be contacted by phone if any changes need to be made immediately.  Otherwise, you will receive a letter about your results with an explanation, but please check with MyChart first.  Please make an Appointment to return in 6 months, or sooner if needed

## 2023-10-09 ENCOUNTER — Encounter: Payer: Self-pay | Admitting: Family Medicine

## 2023-10-09 ENCOUNTER — Ambulatory Visit (INDEPENDENT_AMBULATORY_CARE_PROVIDER_SITE_OTHER): Payer: 59

## 2023-10-09 ENCOUNTER — Ambulatory Visit: Payer: 59 | Admitting: Family Medicine

## 2023-10-09 ENCOUNTER — Other Ambulatory Visit (HOSPITAL_COMMUNITY): Payer: Self-pay

## 2023-10-09 ENCOUNTER — Encounter: Payer: Self-pay | Admitting: Internal Medicine

## 2023-10-09 ENCOUNTER — Other Ambulatory Visit: Payer: Self-pay

## 2023-10-09 VITALS — BP 126/82 | HR 92 | Ht 64.0 in | Wt 232.0 lb

## 2023-10-09 DIAGNOSIS — G8929 Other chronic pain: Secondary | ICD-10-CM | POA: Diagnosis not present

## 2023-10-09 DIAGNOSIS — M170 Bilateral primary osteoarthritis of knee: Secondary | ICD-10-CM

## 2023-10-09 DIAGNOSIS — M25561 Pain in right knee: Secondary | ICD-10-CM

## 2023-10-09 DIAGNOSIS — M25461 Effusion, right knee: Secondary | ICD-10-CM | POA: Insufficient documentation

## 2023-10-09 DIAGNOSIS — M25562 Pain in left knee: Secondary | ICD-10-CM

## 2023-10-09 NOTE — Assessment & Plan Note (Signed)
Lab Results  Component Value Date   LDLCALC 52 10/07/2023   Stable, pt to continue current statin crestor 40 qd

## 2023-10-09 NOTE — Assessment & Plan Note (Signed)
BP Readings from Last 3 Encounters:  10/09/23 126/82  10/07/23 122/74  11/28/22 110/82   Stable, pt to continue medical treatment tribenzor 40 5 12.5 qd

## 2023-10-09 NOTE — Progress Notes (Signed)
I, Stevenson Clinch, CMA acting as a scribe for Clementeen Graham, MD.  Martha Alvarado is a 57 y.o. female who presents to [facility] at [facility] today for bilat knee pain ongoing for about 2 months. MOI: none. Pt locates pain to lateral aspect of the left knee and peripatellar region of the right knee. Notes that L knee pain is > R. Notes being slow to start when first standing. Minimal relief with OTC treatments. Dr. Jonny Ruiz prescribed Tramadol but she has not taken it yet. Works at the dialysis center, stands throughout most of the day .  Knee swelling: yes Mechanical symptoms: yes Aggravates: prolonged standing Treatments tried: IBU, Tylenol, Celebrex, heat, ice  Pertinent review of systems: No fevers or chills  Relevant historical information: Hypertension and diabetes.   Exam:  BP 126/82   Pulse 92   Ht 5\' 4"  (1.626 m)   Wt 232 lb (105.2 kg)   SpO2 97%   BMI 39.82 kg/m  General: Well Developed, well nourished, and in no acute distress.   MSK:  Left knee moderate effusion  normal motion with crepitation.  Tender palpation lateral joint line. Intact strength  Right knee mild effusion otherwise normal-appearing. Normal motion with crepitation. Tender palpation medial joint line. Intact strength  Lab and Radiology Results  Procedure: Real-time Ultrasound Guided Injection of left knee joint superior lateral patella space Device: Philips Affiniti 50G/GE Logiq Images permanently stored and available for review in PACS Verbal informed consent obtained.  Discussed risks and benefits of procedure. Warned about infection, bleeding, hyperglycemia damage to structures among others. Patient expresses understanding and agreement Time-out conducted.   Noted no overlying erythema, induration, or other signs of local infection.   Skin prepped in a sterile fashion.   Local anesthesia: Topical Ethyl chloride.   With sterile technique and under real time ultrasound  guidance: 40 mg of Kenalog and 2 mL of Marcaine injected into knee joint. Fluid seen entering the joint capsule.   Completed without difficulty   Pain immediately resolved suggesting accurate placement of the medication.   Advised to call if fevers/chills, erythema, induration, drainage, or persistent bleeding.   Images permanently stored and available for review in the ultrasound unit.  Impression: Technically successful ultrasound guided injection.   Procedure: Real-time Ultrasound Guided Injection of right knee joint superior lateral patella space Device: Philips Affiniti 50G/GE Logiq Images permanently stored and available for review in PACS Verbal informed consent obtained.  Discussed risks and benefits of procedure. Warned about infection, bleeding, hyperglycemia damage to structures among others. Patient expresses understanding and agreement Time-out conducted.   Noted no overlying erythema, induration, or other signs of local infection.   Skin prepped in a sterile fashion.   Local anesthesia: Topical Ethyl chloride.   With sterile technique and under real time ultrasound guidance: 40 mg of Kenalog and 2 mL of Marcaine injected into knee joint. Fluid seen entering the joint capsule.   Completed without difficulty   Pain immediately resolved suggesting accurate placement of the medication.   Advised to call if fevers/chills, erythema, induration, drainage, or persistent bleeding.   Images permanently stored and available for review in the ultrasound unit.  Impression: Technically successful ultrasound guided injection.   X-ray images bilateral knees obtained today personally and independently interpreted  Left knee: Mild lateral compartment DJD.  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  Right knee: Mild medial and moderate patellofemoral DJD.  No acute fractures are visible.  Await formal radiology  review    Assessment and Plan: 57 y.o. female with bilateral  knee pain left worse than right thought to be due to DJD.  Plan for steroid injection and Voltaren gel.  Continue Tylenol.  Recheck back as needed.   PDMP not reviewed this encounter. Orders Placed This Encounter  Procedures   [place] LIMITED JOINT SPACE STRUCTURES LOW BILAT(NO LINKED CHARGES)    Order Specific Question:   Reason for Exam (SYMPTOM  OR DIAGNOSIS REQUIRED)    Answer:   bilat knee pain    Order Specific Question:   Preferred imaging location?    Answer:   [facility] Knee AP/LAT W/Sunrise Right    Standing Status:   Future    Number of Occurrences:   1    Standing Expiration Date:   11/09/2023    Order Specific Question:   Reason for Exam (SYMPTOM  OR DIAGNOSIS REQUIRED)    Answer:   bilateral knee pain    Order Specific Question:   Preferred imaging location?    Answer:   [facility]    Order Specific Question:   Is patient pregnant?    Answer:   No   DG Knee AP/LAT W/Sunrise Left    Standing Status:   Future    Number of Occurrences:   1    Standing Expiration Date:   11/09/2023    Order Specific Question:   Reason for Exam (SYMPTOM  OR DIAGNOSIS REQUIRED)    Answer:   bilateral knee pain    Order Specific Question:   Preferred imaging location?    Answer:   [facility]    Order Specific Question:   Is patient pregnant?    Answer:   No   No orders of the defined types were placed in this encounter.    Discussed warning signs or symptoms. Please see discharge instructions. Patient expresses understanding.   The above documentation has been reviewed and is accurate and complete Clementeen Graham, M.D.

## 2023-10-09 NOTE — Assessment & Plan Note (Signed)
Last vitamin D Lab Results  Component Value Date   VD25OH 29.40 (L) 10/07/2023   Low, to start oral replacement

## 2023-10-09 NOTE — Patient Instructions (Signed)
Thank you for coming in today.   Please get an Xray today before you leave   Please use Voltaren gel (Generic Diclofenac Gel) up to 4x daily for pain as needed.  This is available over-the-counter as both the name brand Voltaren gel and the generic diclofenac gel.   Call or go to the ER if you develop a large red swollen joint with extreme pain or oozing puss.    I can do these same shots in 3 months if needed.

## 2023-10-09 NOTE — Assessment & Plan Note (Signed)

## 2023-10-09 NOTE — Assessment & Plan Note (Addendum)
Lab Results  Component Value Date   HGBA1C 6.6 (H) 10/07/2023   uncontrolled, pt to start medical treatment start mounjaro 2.5 weekly, also refer optho

## 2023-10-09 NOTE — Assessment & Plan Note (Signed)
With left > rght mod to severe pain  - for tramadol prn, refer sport medicine

## 2023-10-15 ENCOUNTER — Other Ambulatory Visit (HOSPITAL_COMMUNITY): Payer: Self-pay

## 2023-10-15 ENCOUNTER — Telehealth: Payer: Self-pay

## 2023-10-15 NOTE — Telephone Encounter (Signed)
Pharmacy Patient Advocate Encounter   Received notification from CoverMyMeds that prior authorization for [facility] 2.5MG /0.5ML auto-injectors is required/requested.   Insurance verification completed.   The patient is insured through [employer] .   Per test claim: APPROVED from 09/15/23 to 10/14/24    Key: [id number] PA Case ID #: [id number]

## 2023-10-18 ENCOUNTER — Other Ambulatory Visit (HOSPITAL_COMMUNITY): Payer: Self-pay

## 2023-10-21 ENCOUNTER — Other Ambulatory Visit (HOSPITAL_COMMUNITY): Payer: Self-pay

## 2023-10-23 ENCOUNTER — Other Ambulatory Visit (HOSPITAL_COMMUNITY): Payer: Self-pay

## 2023-10-25 ENCOUNTER — Ambulatory Visit
Admission: RE | Admit: 2023-10-25 | Discharge: 2023-10-25 | Disposition: A | Discharge status: Home / Self Care | Payer: 59 | Source: Ambulatory Visit | Attending: Internal Medicine | Admitting: Internal Medicine

## 2023-10-25 DIAGNOSIS — Z1231 Encounter for screening mammogram for malignant neoplasm of breast: Secondary | ICD-10-CM

## 2023-10-29 NOTE — Progress Notes (Signed)
Right knee x-ray shows medium to severe arthritis.

## 2023-10-29 NOTE — Progress Notes (Signed)
Left knee x-ray shows medium arthritis.

## 2023-11-13 ENCOUNTER — Encounter: Payer: Self-pay | Admitting: Internal Medicine

## 2023-11-14 ENCOUNTER — Other Ambulatory Visit (HOSPITAL_COMMUNITY): Payer: Self-pay

## 2023-12-13 ENCOUNTER — Encounter: Payer: Self-pay | Admitting: Obstetrics and Gynecology

## 2023-12-13 ENCOUNTER — Other Ambulatory Visit: Payer: Self-pay

## 2023-12-13 ENCOUNTER — Ambulatory Visit (INDEPENDENT_AMBULATORY_CARE_PROVIDER_SITE_OTHER): Payer: 59 | Admitting: Obstetrics and Gynecology

## 2023-12-13 VITALS — BP 136/82 | HR 59 | Ht 65.5 in | Wt 218.0 lb

## 2023-12-13 DIAGNOSIS — E2839 Other primary ovarian failure: Secondary | ICD-10-CM

## 2023-12-13 DIAGNOSIS — Z01419 Encounter for gynecological examination (general) (routine) without abnormal findings: Secondary | ICD-10-CM | POA: Diagnosis not present

## 2023-12-13 DIAGNOSIS — D219 Benign neoplasm of connective and other soft tissue, unspecified: Secondary | ICD-10-CM | POA: Diagnosis not present

## 2023-12-13 NOTE — Progress Notes (Signed)
57 y.o. y.o. female here for annual exam. No LMP recorded. Patient is postmenopausal.    The current method of family planning is post menopausal status.    Exercising: Yes.    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 Abnormal MRI in past to get baseline Colonoscopy: 07/31/17 benign polyps, f/u in 10 years.   TDaP:  06/25/2019 Gardasil: n/a Body mass index is 35.73 kg/m.     10/07/2023    8:43 AM 11/09/2022    8:15 AM 11/09/2022    8:06 AM  Depression screen PHQ 2/9  Decreased Interest 0 0 0  Down, Depressed, Hopeless 0 0 0  PHQ - 2 Score 0 0 0    Blood pressure 136/82, pulse (!) 59, height 5' 5.5" (1.664 m), weight 218 lb (98.9 kg), SpO2 97%.     Component Value Date/Time   DIAGPAP  11/28/2022 1422    - Negative for intraepithelial lesion or malignancy (NILM)   HPVHIGH Negative 11/28/2022 1422   ADEQPAP  11/28/2022 1422    Satisfactory for evaluation; transformation zone component PRESENT.    GYN HISTORY:    Component Value Date/Time   DIAGPAP  11/28/2022 1422    - Negative for intraepithelial lesion or malignancy (NILM)   HPVHIGH Negative 11/28/2022 1422   ADEQPAP  11/28/2022 1422    Satisfactory for evaluation; transformation zone component PRESENT.    OB History  Gravida Para Term Preterm AB Living  2 2 2   2   SAB IAB Ectopic Multiple Live Births      2    # Outcome Date GA Lbr Len/2nd Weight Sex Type Anes PTL Lv  2 Term     M Vag-Spont  N LIV  1 Term     F Vag-Spont  N LIV    Past Medical History:  Diagnosis Date   CHLAMYDIAL INFECTION, HX OF 09/13/2007   Hyperlipidemia 05/20/2015   HYPERTENSION 09/13/2007   Impaired glucose tolerance 07/08/2011   SYPHILIS 09/13/2007    Past Surgical History:  Procedure Laterality Date   LUMBAR LAMINECTOMY N/A 10/10/2020   Procedure: RIGHT L5-S1 MICRODISCECTOMY LUMBAR  LAMINECTOMY;  Surgeon: Eldred Manges, MD;  Location: [facility] OR;  Service: Orthopedics;  Laterality: N/A;   TUBAL LIGATION  1991    Current Outpatient Medications on File Prior to Visit  Medication Sig Dispense Refill   aspirin EC 81 MG tablet Take 81 mg by mouth daily. Swallow whole.     celecoxib (CELEBREX) 200 MG capsule Take 1 capsule (200 mg total) by mouth daily. 90 capsule 0   Cholecalciferol 50 MCG (2000 UT) TABS 1 tab by mouth once daily 30 tablet 99   Olmesartan-amLODIPine-HCTZ 40-5-12.5 MG TABS TAKE 1 TABLET BY MOUTH ONCE A DAY 90 tablet 3   potassium chloride (KLOR-CON) 10 MEQ tablet TAKE 1 TABLET BY MOUTH EVERY MORNING AND EVERY NIGHT AT BEDTIME 180 tablet 3   rosuvastatin (CRESTOR) 40 MG tablet Take 1 tablet (40 mg total) by mouth daily. 90 tablet 3   tirzepatide (MOUNJARO) 2.5 MG/0.5ML Pen Inject 2.5 mg into the skin once a week. E11.9 2 mL 11   traMADol (ULTRAM) 50 MG tablet Take 1 tablet (50 mg total) by mouth every 6 (six) hours as needed. 30 tablet 0   gabapentin (NEURONTIN) 300 MG capsule TAKE 1  CAPSULE BY MOUTH 3 TIMES DAILY 270 capsule 1   No current facility-administered medications on file prior to visit.    Social History   Socioeconomic History   Marital status: Married    Spouse name: Not on file   Number of children: Not on file   Years of education: Not on file   Highest education level: 12th grade  Occupational History   Not on file  Tobacco Use   Smoking status: Never   Smokeless tobacco: Never  Vaping Use   Vaping status: Never Used  Substance and Sexual Activity   Alcohol use: No   Drug use: Not Currently   Sexual activity: Yes    Birth control/protection: Post-menopausal, Surgical    Comment: 1st intercourse 57 yo-Fewer than 5 partners-BTL  Other Topics Concern   Not on file  Social History Narrative   Not on file   Social Drivers of Health   Financial Resource Strain: Low Risk  (10/06/2023)   Overall Financial Resource Strain (CARDIA)     Difficulty of Paying Living Expenses: Not hard at all  Food Insecurity: No Food Insecurity (10/06/2023)   Hunger Vital Sign    Worried About Running Out of Food in the Last Year: Never true    Ran Out of Food in the Last Year: Never true  Transportation Needs: No Transportation Needs (10/06/2023)   PRAPARE - Administrator, Civil Service (Medical): No    Lack of Transportation (Non-Medical): No  Physical Activity: Unknown (10/06/2023)   Exercise Vital Sign    Days of Exercise per Week: 0 days    Minutes of Exercise per Session: Not on file  Stress: No Stress Concern Present (10/06/2023)   [employer] of Occupational Health - Occupational Stress Questionnaire    Feeling of Stress : Not at all  Social Connections: Moderately Integrated (10/06/2023)   Social Connection and Isolation Panel [NHANES]    Frequency of Communication with Friends and Family: More than three times a week    Frequency of Social Gatherings with Friends and Family: Twice a week    Attends Religious Services: More than 4 times per year    Active Member of [employer] or Organizations: No    Attends Engineer, structural: Not on file    Marital Status: Married  Catering manager Violence: Not on file    Family History  Problem Relation Age of Onset   Diabetes Mother    Diabetes Sister    Hypertension Sister    Hypertension Sister    Colon cancer Neg Hx    Esophageal cancer Neg Hx    Rectal cancer Neg Hx    Stomach cancer Neg Hx      No Known Allergies    Patient's last menstrual period was No LMP recorded. Patient is postmenopausal..             Review of Systems Alls systems reviewed and are negative.     Physical Exam Constitutional:      Appearance: Normal appearance.  Genitourinary:     Vulva and urethral meatus normal.     No lesions in the vagina.     Right Labia: No rash, lesions or skin changes.    Left Labia: No lesions, skin changes or rash.    No vaginal discharge  or tenderness.     No vaginal prolapse present.    No vaginal atrophy present.     Right Adnexa: not tender, not palpable and no mass  present.    Left Adnexa: not tender, not palpable and no mass present.    No cervical motion tenderness or discharge.     Uterus is enlarged and irregular.     Uterus is not tender.  Breasts:    Right: Normal.     Left: Normal.  HENT:     Head: Normocephalic.  Neck:     Thyroid: No thyroid mass, thyromegaly or thyroid tenderness.  Cardiovascular:     Rate and Rhythm: Normal rate and regular rhythm.     Heart sounds: Normal heart sounds, S1 normal and S2 normal.  Pulmonary:     Effort: Pulmonary effort is normal.     Breath sounds: Normal breath sounds and air entry.  Abdominal:     General: There is no distension.     Palpations: Abdomen is soft. There is no mass.     Tenderness: There is no abdominal tenderness. There is no guarding or rebound.  Musculoskeletal:        General: Normal range of motion.     Cervical back: Full passive range of motion without pain, normal range of motion and neck supple. No tenderness.     Right lower leg: No edema.     Left lower leg: No edema.  Neurological:     Mental Status: She is alert.  Skin:    General: Skin is warm.  Psychiatric:        Mood and Affect: Mood normal.        Behavior: Behavior normal.        Thought Content: Thought content normal.  Vitals and nursing note reviewed. Exam conducted with a chaperone present.       A:         Well Woman GYN exam                             P:        Pap smear not indicated Encouraged annual mammogram screening Colon cancer screening up-to-date DXA ordered today hypoestrogen with risk factors Labs and immunizations to do with PMD Encouraged healthy lifestyle practices Encouraged Vit D and Calcium  H/o fibroids to get TV US to evaluate for any changes No follow-ups on file.  Earley Favor

## 2023-12-14 ENCOUNTER — Other Ambulatory Visit (HOSPITAL_COMMUNITY): Payer: Self-pay

## 2024-01-08 NOTE — Progress Notes (Signed)
 I, Leotis Batter, CMA acting as a scribe for Artist Lloyd, MD.  Martha Alvarado is a 58 y.o. female who presents to [facility] at [facility] today for exacerbation of her bilat knee pain. Pt was last seen by Dr. Lloyd on 10/09/23 and was given bilat knee steroid injections, use Voltaren gel, and cont Tylenol .  Today, pt reports continued bilateral knee pain. Notes intermittent swelling in the knees, worse after being more active. Sx worse with sit-stand, prolonged time sitting, and ambulation. Feels stiff. Mechanical sx present. Locates pain distal to patella on left knee and medial aspect of the right knee. Sx causing night disturbance. Has started on Magnesium Supplement and Vitamin D .    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   Exam:  BP 130/84   Pulse (!) 59   Ht 5' 5.5 (1.664 m)   Wt 218 lb (98.9 kg)   SpO2 100%   BMI 35.73 kg/m  General: Well Developed, well nourished, and in no acute distress.   MSK: Knees bilaterally mild effusion normal motion with crepitation.  Tender palpation medial joint line right knee and lateral joint line left knee.    Lab and Radiology Results  Procedure: Real-time Ultrasound Guided Injection of right knee joint superior lateral patella space Device: Philips Affiniti 50G/GE Logiq Images permanently stored and available for review in PACS Verbal informed consent obtained.  Discussed risks and benefits of procedure. Warned about infection, bleeding, hyperglycemia damage to structures among others. Patient expresses understanding and agreement Time-out conducted.   Noted no overlying erythema, induration, or other signs of local infection.   Skin prepped in a sterile fashion.   Local anesthesia: Topical Ethyl chloride.   With sterile technique and under real time ultrasound guidance: 40 mg of  Kenalog  and 2 mL of Marcaine  injected into knee joint. Fluid seen entering the joint capsule.   Completed without difficulty   Pain immediately resolved suggesting accurate placement of the medication.   Advised to call if fevers/chills, erythema, induration, drainage, or persistent bleeding.   Images permanently stored and available for review in the ultrasound unit.  Impression: Technically successful ultrasound guided injection.   Procedure: Real-time Ultrasound Guided Injection of left knee joint superior lateral patella space Device: Philips Affiniti 50G/GE Logiq Images permanently stored and available for review in PACS Verbal informed consent obtained.  Discussed risks and benefits of procedure. Warned about infection, bleeding, hyperglycemia damage to structures among others. Patient expresses understanding and agreement Time-out conducted.   Noted no overlying erythema, induration, or other signs of local infection.   Skin prepped in a sterile fashion.   Local anesthesia: Topical Ethyl chloride.   With sterile technique and under real time ultrasound guidance: 40 mg of Kenalog  and 2 mL of Marcaine  injected into knee joint. Fluid seen entering the joint capsule.   Completed without difficulty   Pain immediately resolved suggesting accurate placement of the medication.   Advised to call if fevers/chills, erythema, induration, drainage, or persistent bleeding.   Images permanently stored and available for review in the ultrasound unit.  Impression: Technically successful ultrasound guided injection.        Assessment and Plan: 58 y.o. female with chronic bilateral knee pain due to DJD seen on prior x-ray.  Plan today for repeat steroid injection.  Previous injections lasted about 2 months.  Will work on  authorization for hyaluronic acid injections and Zilretta  injections in preparation for potential injection with these products if the current steroid injection wears off sooner  than 3 months.  She will let me know ahead of time and we can be ready to go with what ever is next.   PDMP not reviewed this encounter. Orders Placed This Encounter  Procedures   [place]  LIMITED JOINT SPACE STRUCTURES LOW BILAT(NO LINKED CHARGES)    Reason for Exam (SYMPTOM  OR DIAGNOSIS REQUIRED):   bilat knee pain    Preferred imaging location?:   [facility]   No orders of the defined types were placed in this encounter.    Discussed warning signs or symptoms. Please see discharge instructions. Patient expresses understanding.   The above documentation has been reviewed and is accurate and complete Artist Lloyd, M.D.

## 2024-01-09 ENCOUNTER — Encounter: Payer: Self-pay | Admitting: Family Medicine

## 2024-01-09 ENCOUNTER — Ambulatory Visit: Payer: 59 | Admitting: Obstetrics and Gynecology

## 2024-01-09 ENCOUNTER — Ambulatory Visit: Payer: Self-pay

## 2024-01-09 ENCOUNTER — Ambulatory Visit: Payer: 59 | Admitting: Family Medicine

## 2024-01-09 ENCOUNTER — Encounter: Payer: Self-pay | Admitting: Obstetrics and Gynecology

## 2024-01-09 ENCOUNTER — Telehealth: Payer: Self-pay

## 2024-01-09 VITALS — BP 130/84 | HR 59 | Ht 65.5 in | Wt 218.0 lb

## 2024-01-09 VITALS — BP 118/70

## 2024-01-09 DIAGNOSIS — M25561 Pain in right knee: Secondary | ICD-10-CM | POA: Diagnosis not present

## 2024-01-09 DIAGNOSIS — M170 Bilateral primary osteoarthritis of knee: Secondary | ICD-10-CM

## 2024-01-09 DIAGNOSIS — D219 Benign neoplasm of connective and other soft tissue, unspecified: Secondary | ICD-10-CM | POA: Diagnosis not present

## 2024-01-09 DIAGNOSIS — Z09 Encounter for follow-up examination after completed treatment for conditions other than malignant neoplasm: Secondary | ICD-10-CM

## 2024-01-09 DIAGNOSIS — M25562 Pain in left knee: Secondary | ICD-10-CM | POA: Diagnosis not present

## 2024-01-09 DIAGNOSIS — G8929 Other chronic pain: Secondary | ICD-10-CM

## 2024-01-09 NOTE — Progress Notes (Signed)
   Acute Office Visit  Subjective:    Patient ID: Martha Alvarado, female    DOB: 1966/08/16, 58 y.o.   MRN: [id number]   HPI 58 y.o. presents today for Follow-up (Pt here for a follow up conversation//jj) . Patient presents for follow-up on last visit's  referrals.  No LMP recorded. Patient is postmenopausal.    Review of Systems     Objective:    OBGyn Exam  BP 118/70  Wt Readings from Last 3 Encounters:  01/09/24 218 lb (98.9 kg)  12/13/23 218 lb (98.9 kg)  10/09/23 232 lb (105.2 kg)        Patient informed chaperone available to be present for breast and/or pelvic exam. Patient has requested no chaperone to be present. Patient has been advised what will be completed during breast and pelvic exam.   Assessment & Plan:  Patient to schedule PUS at the front desk with h/o fibroids to evaluate for any concerning changes in menopause Patient to schedule bone dxa scan with abnormal changes on MRI and osteoarthritis history. She is planning on doing a knee replacement soon. 5 minutes spent on reviewing records, imaging,  and one on one patient time and counseling patient and documentation Dr. Glennon Almarie MARLA Glennon

## 2024-01-09 NOTE — Telephone Encounter (Signed)
 Check coverage for visco and Zilretta for bilat knee OA.

## 2024-01-09 NOTE — Patient Instructions (Addendum)
 Thank you for coming in today.  You received an injection today. Seek immediate medical attention if the joint becomes red, extremely painful, or is oozing fluid.  We will work on authorization for gel shots and Zilretta  shots.   Let me know when the shots today stop working.

## 2024-01-10 NOTE — Telephone Encounter (Signed)
 VOB initiated for Zilretta for BILAT knee OA

## 2024-01-11 ENCOUNTER — Other Ambulatory Visit (HOSPITAL_COMMUNITY): Payer: Self-pay

## 2024-01-14 ENCOUNTER — Other Ambulatory Visit (HOSPITAL_COMMUNITY): Payer: Self-pay

## 2024-01-15 NOTE — Telephone Encounter (Signed)
 Medical Buy and Raenette Bumps - prior authorization NOT required  Specialty Pharmacy - NOT covered under pharmacy benefits

## 2024-01-16 ENCOUNTER — Encounter: Payer: Self-pay | Admitting: Family Medicine

## 2024-01-16 ENCOUNTER — Ambulatory Visit (HOSPITAL_BASED_OUTPATIENT_CLINIC_OR_DEPARTMENT_OTHER)
Admission: RE | Admit: 2024-01-16 | Discharge: 2024-01-16 | Disposition: A | Discharge status: Home / Self Care | Payer: 59 | Source: Ambulatory Visit | Attending: Obstetrics and Gynecology | Admitting: Obstetrics and Gynecology

## 2024-01-16 DIAGNOSIS — Z01419 Encounter for gynecological examination (general) (routine) without abnormal findings: Secondary | ICD-10-CM | POA: Diagnosis present

## 2024-01-16 DIAGNOSIS — E2839 Other primary ovarian failure: Secondary | ICD-10-CM | POA: Insufficient documentation

## 2024-01-20 NOTE — Telephone Encounter (Signed)
VOB initiated for Orthovisc for BILAT knee OA.  

## 2024-01-21 NOTE — Telephone Encounter (Signed)
ZILRETTA for BILAT knee OA   Medical Buy and Bill  Primary Insurance: UHC EPO Co-pay: $35 Co-insurance: 20% Deductible: $0 of $500 met - must be met for coverage to apply Prior Auth: NOT required   Knee Injection History 10/09/23 - Kenalog BILAT 01/09/24 - Kenalog BILAT

## 2024-01-22 NOTE — Telephone Encounter (Signed)
Left message for patient to call back to schedule.  °

## 2024-01-23 ENCOUNTER — Encounter: Payer: Self-pay | Admitting: Obstetrics and Gynecology

## 2024-01-28 NOTE — Progress Notes (Unsigned)
  Zilretta injection bilateral knee Procedure: Real-time Ultrasound Guided Injection of right knee joint superior lateral patellar space Device: Philips Affiniti 50G Images permanently stored and available for review in PACS Verbal informed consent obtained.  Discussed risks and benefits of procedure. Warned about infection, hyperglycemia bleeding, damage to structures among others. Patient expresses understanding and agreement Time-out conducted.   Noted no overlying erythema, induration, or other signs of local infection.   Skin prepped in a sterile fashion.   Local anesthesia: Topical Ethyl chloride.   With sterile technique and under real time ultrasound guidance: Zilretta 32 mg injected into knee joint. Fluid seen entering the joint capsule.   Completed without difficulty   Advised to call if fevers/chills, erythema, induration, drainage, or persistent bleeding.   Images permanently stored and available for review in the ultrasound unit.  Impression: Technically successful ultrasound guided injection.   Procedure: Real-time Ultrasound Guided Injection of left knee joint superior lateral patellar space Device: Philips Affiniti 50G Images permanently stored and available for review in PACS Verbal informed consent obtained.  Discussed risks and benefits of procedure. Warned about infection, hyperglycemia bleeding, damage to structures among others. Patient expresses understanding and agreement Time-out conducted.   Noted no overlying erythema, induration, or other signs of local infection.   Skin prepped in a sterile fashion.   Local anesthesia: Topical Ethyl chloride.   With sterile technique and under real time ultrasound guidance: Zilretta 32 mg injected into knee joint. Fluid seen entering the joint capsule.   Completed without difficulty   Advised to call if fevers/chills, erythema, induration, drainage, or persistent bleeding.   Images permanently stored and available for review  in the ultrasound unit.  Impression: Technically successful ultrasound guided injection.  Lot number: [id number]

## 2024-01-28 NOTE — Telephone Encounter (Signed)
Medical Buy and Annette Stable - Prior Authorization REQUIRED  Pharmacy benefit - [employer] - Prior Authorization REQUIRED

## 2024-01-28 NOTE — Telephone Encounter (Signed)
Per [facility] provider portal:  Drug Exception  This drug may be covered under the [facility] pharmacy benefit. Please contact the member's PBM for an authorization.

## 2024-01-29 ENCOUNTER — Other Ambulatory Visit: Payer: Self-pay

## 2024-01-29 ENCOUNTER — Ambulatory Visit: Payer: 59 | Admitting: Family Medicine

## 2024-01-29 VITALS — BP 130/84 | HR 59 | Ht 65.5 in | Wt 218.0 lb

## 2024-01-29 DIAGNOSIS — M170 Bilateral primary osteoarthritis of knee: Secondary | ICD-10-CM

## 2024-01-29 DIAGNOSIS — M25561 Pain in right knee: Secondary | ICD-10-CM | POA: Diagnosis not present

## 2024-01-29 DIAGNOSIS — M25562 Pain in left knee: Secondary | ICD-10-CM | POA: Diagnosis not present

## 2024-01-29 DIAGNOSIS — G8929 Other chronic pain: Secondary | ICD-10-CM

## 2024-01-29 MED ORDER — TRIAMCINOLONE ACETONIDE 32 MG IX SRER
32.0000 mg | Freq: Once | INTRA_ARTICULAR | Status: AC
  Administered 2024-01-29: 32 mg via INTRA_ARTICULAR

## 2024-01-29 NOTE — Patient Instructions (Addendum)
Thank you for coming in today.   You received an injection today. Seek immediate medical attention if the joint becomes red, extremely painful, or is oozing fluid.   I can repeat these injections in 3 months, if needed.  Send me a MyChart message about a week in advance.

## 2024-01-30 ENCOUNTER — Other Ambulatory Visit: Payer: Self-pay | Admitting: Obstetrics and Gynecology

## 2024-01-30 DIAGNOSIS — E2839 Other primary ovarian failure: Secondary | ICD-10-CM

## 2024-01-30 DIAGNOSIS — Z01419 Encounter for gynecological examination (general) (routine) without abnormal findings: Secondary | ICD-10-CM

## 2024-01-30 DIAGNOSIS — D219 Benign neoplasm of connective and other soft tissue, unspecified: Secondary | ICD-10-CM

## 2024-02-04 NOTE — Telephone Encounter (Signed)
 VOB initiated for GELSYN for BILAT knee OA.

## 2024-02-05 NOTE — Telephone Encounter (Signed)
 Pt had Zilretta . Holding until needed.

## 2024-02-05 NOTE — Telephone Encounter (Signed)
 Medical Buy and Annette Stable - Prior Authorization NOT required  Pharmacy Benefit - Prior Authorization NOT required

## 2024-02-05 NOTE — Telephone Encounter (Signed)
 GELSYN for BILAT knee OA   Medical Buy and Bill  Primary Insurance: [employer] EPO Co-pay: $35 Co-insurance: 20% Deductible: $500 of $500 met Prior Auth: NOT required  Pharmacy Benefit  PBM - [employer] Prior Auth: NOT required   Knee Injection History 10/09/23 - Kenalog  BILAT 01/29/24 - Zilretta  BILAT

## 2024-02-07 ENCOUNTER — Telehealth: Payer: Self-pay

## 2024-02-07 NOTE — Telephone Encounter (Signed)
 Noted.

## 2024-02-07 NOTE — Telephone Encounter (Signed)
 Patient is scheduled for 04/28/24   Gelsyn authorized for bilateral knee   Plan covers at 80% of allowable amount for Gelsyn-3 J7328 and 100% of allowable amount for procedure 20610/20611. Deductible must be met before coverage applies. Patient has a $35 copay whether, or not an office visit is billed. Only one copay applies per date of service. If out of pocket is met, coverage goes to 100% and copay will no longer apply. No pre-cert or referrals needed. Medical notes must be submitted with th

## 2024-02-09 ENCOUNTER — Other Ambulatory Visit: Payer: Self-pay

## 2024-02-13 ENCOUNTER — Ambulatory Visit (INDEPENDENT_AMBULATORY_CARE_PROVIDER_SITE_OTHER): Payer: 59

## 2024-02-13 ENCOUNTER — Encounter: Payer: Self-pay | Admitting: Obstetrics and Gynecology

## 2024-02-13 ENCOUNTER — Ambulatory Visit: Payer: 59 | Admitting: Obstetrics and Gynecology

## 2024-02-13 VITALS — BP 118/78 | HR 64

## 2024-02-13 DIAGNOSIS — N840 Polyp of corpus uteri: Secondary | ICD-10-CM

## 2024-02-13 DIAGNOSIS — D219 Benign neoplasm of connective and other soft tissue, unspecified: Secondary | ICD-10-CM

## 2024-02-13 NOTE — Progress Notes (Signed)
Patient presents for [place] results. No PM bleeding.  PUS 6.56cm anteverted uterus Two fibroids seen 4.19cm and 2.53cm EM 1.76mm FF seen in canal A 15 x 5mm avascular complex area is seen LUS in the EM canal ?polyp  Both ovaries WNL  No adnexal masses No free fluid   A/p PUS results  [place] with possible polyp. Discussed polyps can cause bleeding and have associated risk for cancer <1% risk.  Discussed best way to remove these is with the hysteroscopy myosure D&C. This is done with anesthesia in the operating room.   The procedure was reviewed in detail. She would like to have this scheduled. Discussed she will need a driver there and to home.  She could have light bleeding for 7-10 days after. She will need to rest the night of surgery. She can return to work in 1-3 days.  15 minutes spent on reviewing records, imaging,  and one on one patient time and counseling patient and documentation Dr. Karma Greaser

## 2024-02-21 NOTE — Telephone Encounter (Signed)
 Pt received Zilretta injections for BILAT knee OA on 01/29/24 Can consider repeat injections on or after 04/23/24

## 2024-03-10 ENCOUNTER — Other Ambulatory Visit (HOSPITAL_COMMUNITY): Payer: Self-pay

## 2024-03-27 ENCOUNTER — Telehealth: Payer: Self-pay | Admitting: Family Medicine

## 2024-03-27 ENCOUNTER — Encounter: Payer: Self-pay | Admitting: Family Medicine

## 2024-03-27 NOTE — Telephone Encounter (Signed)
 Pt called to move up her appt from 4/29 to 4/11 (per MyChart msg). Appt note sounds like gel, but pt mentioned Zilretta. This new appt will be too soon for Zilretta, please clarify what pt will be getting or if appt needs to be rescheduled.

## 2024-04-08 ENCOUNTER — Telehealth: Payer: Self-pay | Admitting: Family Medicine

## 2024-04-08 ENCOUNTER — Ambulatory Visit: Payer: 59 | Admitting: Family Medicine

## 2024-04-08 NOTE — Telephone Encounter (Signed)
 We are good to go, no prior auth, medication is in stock.   Gelsyn authorized for bilateral knee    Plan covers at 80% of allowable amount for Gelsyn-3 J7328 and 100% of allowable amount for procedure 20610/20611. Deductible must be met before coverage applies. Patient has a $35 copay whether, or not an office visit is billed. Only one copay applies per date of service. If out of pocket is met, coverage goes to 100% and copay will no longer apply. No pre-cert or referrals needed. Medical notes must be submitted with th

## 2024-04-08 NOTE — Telephone Encounter (Signed)
 Patient called to reschedule appointment that was Friday (4/11) to Thursday (4/10). Appointment notes say for [facility]k [facility] and wanted to confirm that her coming Thursday was going to be okay?  Please confirm.

## 2024-04-09 ENCOUNTER — Other Ambulatory Visit: Payer: Self-pay

## 2024-04-09 ENCOUNTER — Encounter: Payer: Self-pay | Admitting: Family Medicine

## 2024-04-09 ENCOUNTER — Ambulatory Visit: Admitting: Family Medicine

## 2024-04-09 VITALS — BP 126/88 | HR 70 | Ht 65.5 in | Wt 214.0 lb

## 2024-04-09 DIAGNOSIS — M25562 Pain in left knee: Secondary | ICD-10-CM | POA: Diagnosis not present

## 2024-04-09 DIAGNOSIS — M170 Bilateral primary osteoarthritis of knee: Secondary | ICD-10-CM | POA: Diagnosis not present

## 2024-04-09 DIAGNOSIS — G8929 Other chronic pain: Secondary | ICD-10-CM | POA: Diagnosis not present

## 2024-04-09 DIAGNOSIS — M25561 Pain in right knee: Secondary | ICD-10-CM

## 2024-04-09 MED ORDER — SODIUM HYALURONATE (VISCOSUP) 16.8 MG/2ML IX SOSY
16.8000 mg | PREFILLED_SYRINGE | Freq: Once | INTRA_ARTICULAR | Status: AC
  Administered 2024-04-09: 16.8 mg via INTRA_ARTICULAR

## 2024-04-09 NOTE — Patient Instructions (Addendum)
 Thank you for coming in today.   You received an injection today. Seek immediate medical attention if the joint becomes red, extremely painful, or is oozing fluid.   Schedule the 2nd Gelsyn injection for 2 weeks from now and the 3rd for the week after that

## 2024-04-09 NOTE — Progress Notes (Addendum)
 I, Miquel Amen, CMA acting as a scribe for Garlan Juniper, MD.  Martha Alvarado is a 58 y.o. female who presents to [facility] today to begin the Gelsyn series bilaterally. Pt was last seen by Dr. Alease Hunter on 01/29/24 and was given bilat Zilretta  injections.  Today, pt reports flare up of bilat knee OA sx over the past several days. Notes L>R knee pain.Denies visible swelling. Gelsyn injection bilaterally today.   Dx imaging: 10/09/23 R & L knee XR   Pertinent review of systems: No fevers or chills  Relevant historical information: Hypertension and diabetes   Exam:  BP 126/88   Pulse 70   Ht 5' 5.5" (1.664 m)   Wt 214 lb (97.1 kg)   SpO2 97%   BMI 35.07 kg/m  General: Well Developed, well nourished, and in no acute distress.   MSK: Bilateral knee mild effusion Mild laxity left knee to LCL stress test.   Lab and Radiology Results  Martha Alvarado presents to clinic today for Gelsyn injection bilateral knee 1/3 Procedure: Real-time Ultrasound Guided Injection of right knee joint superior lateral patellar space Device: Philips Affiniti 50G/GE Logiq Images permanently stored and available for review in PACS Verbal informed consent obtained.  Discussed risks and benefits of procedure. Warned about infection, bleeding, damage to structures among others. Patient expresses understanding and agreement Time-out conducted.   Noted no overlying erythema, induration, or other signs of local infection.   Skin prepped in a sterile fashion.   Local anesthesia: Topical Ethyl chloride.   With sterile technique and under real time ultrasound guidance: Gelsyn 2 mL injected into knee joint. Fluid seen entering the joint capsule.   Completed without difficulty   Advised to call if fevers/chills, erythema, induration, drainage, or persistent bleeding.   Images permanently stored and available for review in the ultrasound unit.  Impression: Technically successful  ultrasound guided injection.  Procedure: Real-time Ultrasound Guided Injection of left knee joint superior lateral patellar space Device: Philips Affiniti 50G/GE Logiq Images permanently stored and available for review in PACS Verbal informed consent obtained.  Discussed risks and benefits of procedure. Warned about infection, bleeding, damage to structures among others. Patient expresses understanding and agreement Time-out conducted.   Noted no overlying erythema, induration, or other signs of local infection.   Skin prepped in a sterile fashion.   Local anesthesia: Topical Ethyl chloride.   With sterile technique and under real time ultrasound guidance: Gelsyn 2 mL injected into knee joint. Fluid seen entering the joint capsule.   Completed without difficulty   Advised to call if fevers/chills, erythema, induration, drainage, or persistent bleeding.   Images permanently stored and available for review in the ultrasound unit.  Impression: Technically successful ultrasound guided injection. Lot number: [id number]     Assessment and Plan: 58 y.o. female with bilateral knee pain due to DJD.  Plan for Gelsyn series starting today.  Return in a little over 1 week for Gelsyn injection both knees 2/3  Additionally we will proceed with a lateral off loader knee brace to the left knee.  This will help with knee pain due to DJD and some of the instability seen on exam today.  Given her quad to calf ratio it is possible that she may require custom knee brace.  PDMP not reviewed this encounter. Orders Placed This Encounter  Procedures   US  LIMITED JOINT SPACE STRUCTURES LOW BILAT(NO LINKED CHARGES)    Reason for Exam (SYMPTOM  OR  DIAGNOSIS REQUIRED):   bilat knee pain / OA    Preferred imaging location?:   [facility] ordered this encounter  Medications   sodium hyaluronate (viscosup) (GELSYN-3) intra-articular injection 16.8 mg   sodium hyaluronate (viscosup)  (GELSYN-3) intra-articular injection 16.8 mg     Discussed warning signs or symptoms. Please see discharge instructions. Patient expresses understanding.   The above documentation has been reviewed and is accurate and complete Garlan Juniper, M.D.

## 2024-04-10 ENCOUNTER — Ambulatory Visit: Admitting: Family Medicine

## 2024-04-10 NOTE — Telephone Encounter (Signed)
 Appointment scheduled for 04/23/24.

## 2024-04-14 ENCOUNTER — Other Ambulatory Visit (HOSPITAL_COMMUNITY): Payer: Self-pay

## 2024-04-23 ENCOUNTER — Other Ambulatory Visit: Payer: Self-pay

## 2024-04-23 ENCOUNTER — Ambulatory Visit: Admitting: Family Medicine

## 2024-04-23 DIAGNOSIS — G8929 Other chronic pain: Secondary | ICD-10-CM | POA: Diagnosis not present

## 2024-04-23 DIAGNOSIS — M25561 Pain in right knee: Secondary | ICD-10-CM

## 2024-04-23 DIAGNOSIS — M170 Bilateral primary osteoarthritis of knee: Secondary | ICD-10-CM | POA: Diagnosis not present

## 2024-04-23 DIAGNOSIS — M25562 Pain in left knee: Secondary | ICD-10-CM | POA: Diagnosis not present

## 2024-04-23 MED ORDER — SODIUM HYALURONATE (VISCOSUP) 16.8 MG/2ML IX SOSY
16.8000 mg | PREFILLED_SYRINGE | Freq: Once | INTRA_ARTICULAR | Status: AC
  Administered 2024-04-23: 16.8 mg via INTRA_ARTICULAR

## 2024-04-23 NOTE — Patient Instructions (Addendum)
 Thank you for coming in today.   You received an injection today. Seek immediate medical attention if the joint becomes red, extremely painful, or is oozing fluid.   See you next week for injection #3

## 2024-04-23 NOTE — Progress Notes (Signed)
 Martha Alvarado presents to clinic today for Gelsyn injection bilateral knee 2/3 Procedure: Real-time Ultrasound Guided Injection of left knee joint superior lateral patellar space Device: Philips Affiniti 50G/GE Logiq Images permanently stored and available for review in PACS Verbal informed consent obtained.  Discussed risks and benefits of procedure. Warned about infection, bleeding, damage to structures among others. Patient expresses understanding and agreement Time-out conducted.   Noted no overlying erythema, induration, or other signs of local infection.   Skin prepped in a sterile fashion.   Local anesthesia: Topical Ethyl chloride.   With sterile technique and under real time ultrasound guidance: Gelsyn 2 mL injected into knee joint. Fluid seen entering the joint capsule.   Completed without difficulty   Advised to call if fevers/chills, erythema, induration, drainage, or persistent bleeding.   Images permanently stored and available for review in the ultrasound unit.  Impression: Technically successful ultrasound guided injection.  Procedure: Real-time Ultrasound Guided Injection of right knee joint superior lateral patellar space Device: Philips Affiniti 50G/GE Logiq Images permanently stored and available for review in PACS Verbal informed consent obtained.  Discussed risks and benefits of procedure. Warned about infection, bleeding, damage to structures among others. Patient expresses understanding and agreement Time-out conducted.   Noted no overlying erythema, induration, or other signs of local infection.   Skin prepped in a sterile fashion.   Local anesthesia: Topical Ethyl chloride.   With sterile technique and under real time ultrasound guidance: Gelsyn 2 mL injected into knee joint. Fluid seen entering the joint capsule.   Completed without difficulty   Advised to call if fevers/chills, erythema, induration, drainage, or persistent bleeding.   Images permanently  stored and available for review in the ultrasound unit.  Impression: Technically successful ultrasound guided injection. Lot number: [id number] for both injections  Patient is still experiencing more significant left knee pain.  Will proceed to a lateral off loader knee brace left knee. Return in 1 week for Gelsyn injections 3/3 both knees.

## 2024-04-24 ENCOUNTER — Telehealth: Payer: Self-pay

## 2024-04-24 NOTE — Telephone Encounter (Signed)
 Zilretta  authorized for bilateral knee No PA required Patient is responsible for 20% of Zilretta  With the remaining covered at 80% by the payer at the contracted rate Patient has a $35 copay whether or not an office visit is billed Deductible is $500 has met $500 OOP max $4500 has met $1783.15 Once OOP has been met coverage goes to 100% and copay will no longer apply  Reference # [id number] 10/23/24

## 2024-04-28 ENCOUNTER — Ambulatory Visit: Payer: 59 | Admitting: Family Medicine

## 2024-04-30 ENCOUNTER — Other Ambulatory Visit: Payer: Self-pay

## 2024-04-30 ENCOUNTER — Ambulatory Visit: Admitting: Family Medicine

## 2024-04-30 DIAGNOSIS — M170 Bilateral primary osteoarthritis of knee: Secondary | ICD-10-CM | POA: Diagnosis not present

## 2024-04-30 MED ORDER — SODIUM HYALURONATE (VISCOSUP) 16.8 MG/2ML IX SOSY
16.8000 mg | PREFILLED_SYRINGE | Freq: Once | INTRA_ARTICULAR | Status: AC
  Administered 2024-04-30: 16.8 mg via INTRA_ARTICULAR

## 2024-04-30 NOTE — Progress Notes (Signed)
 Martha Alvarado presents to clinic today for  Gelysn injection BL knee 3/3 Procedure: Real-time Ultrasound Guided Injection of Right knee joint superior lateral patellar space Device: Philips Affiniti 50G/GE Logiq Images permanently stored and available for review in PACS Verbal informed consent obtained.  Discussed risks and benefits of procedure. Warned about infection, bleeding, damage to structures among others. Patient expresses understanding and agreement Time-out conducted.   Noted no overlying erythema, induration, or other signs of local infection.   Skin prepped in a sterile fashion.   Local anesthesia: Topical Ethyl chloride.   With sterile technique and under real time ultrasound guidance: Gelsyn 2 mL injected into knee joint. Fluid seen entering the joint capsule.   Completed without difficulty   Advised to call if fevers/chills, erythema, induration, drainage, or persistent bleeding.   Images permanently stored and available for review in the ultrasound unit.  Impression: Technically successful ultrasound guided injection.  Procedure: Real-time Ultrasound Guided Injection of left knee joint superior lateral patellar space Device: Philips Affiniti 50G/GE Logiq Images permanently stored and available for review in PACS Verbal informed consent obtained.  Discussed risks and benefits of procedure. Warned about infection, bleeding, damage to structures among others. Patient expresses understanding and agreement Time-out conducted.   Noted no overlying erythema, induration, or other signs of local infection.   Skin prepped in a sterile fashion.   Local anesthesia: Topical Ethyl chloride.   With sterile technique and under real time ultrasound guidance: Gelsyn 2 mL injected into knee joint. Fluid seen entering the joint capsule.   Completed without difficulty   Advised to call if fevers/chills, erythema, induration, drainage, or persistent bleeding.   Images permanently stored and  available for review in the ultrasound unit.  Impression: Technically successful ultrasound guided injection. Lot number: [id number] for both injections Check back as needed.  Patient is getting fitted for off loader knee brace next week.

## 2024-04-30 NOTE — Patient Instructions (Signed)
 Thank you for coming in today.   You received an injection today. Seek immediate medical attention if the joint becomes red, extremely painful, or is oozing fluid.

## 2024-05-10 ENCOUNTER — Other Ambulatory Visit (HOSPITAL_COMMUNITY): Payer: Self-pay

## 2024-05-12 ENCOUNTER — Encounter: Payer: Self-pay | Admitting: Family Medicine

## 2024-06-08 ENCOUNTER — Other Ambulatory Visit (HOSPITAL_COMMUNITY): Payer: Self-pay

## 2024-06-25 ENCOUNTER — Ambulatory Visit: Admitting: Family Medicine

## 2024-06-25 ENCOUNTER — Other Ambulatory Visit: Payer: Self-pay

## 2024-06-25 VITALS — BP 126/88 | HR 76 | Ht 65.5 in | Wt 214.0 lb

## 2024-06-25 DIAGNOSIS — M25561 Pain in right knee: Secondary | ICD-10-CM | POA: Diagnosis not present

## 2024-06-25 DIAGNOSIS — M25562 Pain in left knee: Secondary | ICD-10-CM

## 2024-06-25 DIAGNOSIS — G8929 Other chronic pain: Secondary | ICD-10-CM | POA: Diagnosis not present

## 2024-06-25 DIAGNOSIS — M170 Bilateral primary osteoarthritis of knee: Secondary | ICD-10-CM

## 2024-06-25 DIAGNOSIS — M1712 Unilateral primary osteoarthritis, left knee: Secondary | ICD-10-CM

## 2024-06-25 MED ORDER — TRIAMCINOLONE ACETONIDE 32 MG IX SRER
32.0000 mg | Freq: Once | INTRA_ARTICULAR | Status: AC
  Administered 2024-06-25: 32 mg via INTRA_ARTICULAR

## 2024-06-25 NOTE — Progress Notes (Signed)
   LILLETTE Ileana Collet, PhD, LAT, ATC acting as a scribe for Artist Lloyd, MD.  Martha Alvarado is a 58 y.o. female who presents to [facility] at [facility] today for cont'd bilat knee pain. Pt was last seen by Dr. Lloyd on 04/30/24 and completed the Gelsyn series, 3/3, bilaterally.  Today, pt reports only short term relief from prior gel shots. She just got back from the beach and had been doing a lot of walking. Most of her pain is in the L knee, but the R knee is starting to hurt too.   Dx imaging: 10/09/23 R & L knee XR   Pertinent review of systems: No fevers or chills  Relevant historical information: Diabetes   Exam:  BP 126/88   Pulse 76   Ht 5' 5.5 (1.664 m)   Wt 214 lb (97.1 kg)   SpO2 99%   BMI 35.07 kg/m  General: Well Developed, well nourished, and in no acute distress.   MSK: Knee moderate effusion normal-appearing otherwise.  Normal motion.    Lab and Radiology Results   Zilretta  injection left knee Procedure: Real-time Ultrasound Guided Injection of left knee joint superior lateral patellar space Device: Philips Affiniti 50G Images permanently stored and available for review in PACS Verbal informed consent obtained.  Discussed risks and benefits of procedure. Warned about infection, hyperglycemia bleeding, damage to structures among others. Patient expresses understanding and agreement Time-out conducted.   Noted no overlying erythema, induration, or other signs of local infection.   Skin prepped in a sterile fashion.   Local anesthesia: Topical Ethyl chloride.   With sterile technique and under real time ultrasound guidance: Zilretta  32 mg injected into knee joint. Fluid seen entering the joint capsule.   Completed without difficulty   Advised to call if fevers/chills, erythema, induration, drainage, or persistent bleeding.   Images permanently stored and available for review in the ultrasound unit.  Impression: Technically successful  ultrasound guided injection.  Lot number: [id number]     Assessment and Plan: 58 y.o. female with left knee pain due to DJD exacerbation.  Plan for Zilretta  injection today.  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  She would like to potentially have a knee replacement around October of this year.  Is about 3 months away which would be about the right timing after a Zilretta  injection.  Plan for Zilretta  today and referral to orthopedic surgery for evaluation total knee replacement.   PDMP not reviewed this encounter. Orders Placed This Encounter  Procedures   US  LIMITED JOINT SPACE STRUCTURES LOW BILAT(NO LINKED CHARGES)    Reason for Exam (SYMPTOM  OR DIAGNOSIS REQUIRED):   bilateral knee pain    Preferred imaging location?:   [facility] referral to Orthopedic Surgery    Referral Priority:   Routine    Referral Type:   Surgical    Referral Reason:   Specialty Services Required    Referred to Provider:   Vernetta Lonni GRADE, MD    Requested Specialty:   Orthopedic Surgery    Number of Visits Requested:   1   Meds ordered this encounter  Medications   Triamcinolone  Acetonide (ZILRETTA ) intra-articular injection 32 mg     Discussed warning signs or symptoms. Please see discharge instructions. Patient expresses understanding.   The above documentation has been reviewed and is accurate and complete Artist Lloyd, M.D.

## 2024-06-25 NOTE — Patient Instructions (Addendum)
 Thank you for coming in today.   You received an injection today. Seek immediate medical attention if the joint becomes red, extremely painful, or is oozing fluid.   I've referred you to [facility] for a surgical consultation.  Let us  know if you don't hear from them in one week.   *Reminder, Dr. Joane will be out of the office starting August 1st for about 6 weeks.

## 2024-06-27 ENCOUNTER — Encounter (HOSPITAL_COMMUNITY): Payer: Self-pay

## 2024-06-27 ENCOUNTER — Ambulatory Visit (HOSPITAL_COMMUNITY)
Admission: EM | Admit: 2024-06-27 | Discharge: 2024-06-27 | Disposition: A | Discharge status: Home / Self Care | Attending: Nurse Practitioner | Admitting: Nurse Practitioner

## 2024-06-27 ENCOUNTER — Other Ambulatory Visit (HOSPITAL_COMMUNITY): Payer: Self-pay

## 2024-06-27 DIAGNOSIS — M25511 Pain in right shoulder: Secondary | ICD-10-CM | POA: Diagnosis not present

## 2024-06-27 MED ORDER — KETOROLAC TROMETHAMINE 30 MG/ML IJ SOLN
INTRAMUSCULAR | Status: AC
  Filled 2024-06-27: qty 1

## 2024-06-27 MED ORDER — OXYCODONE HCL 5 MG PO TABS
5.0000 mg | ORAL_TABLET | ORAL | 0 refills | Status: AC | PRN
  Filled 2024-06-27: qty 10, 2d supply, fill #0

## 2024-06-27 MED ORDER — NAPROXEN 500 MG PO TABS
500.0000 mg | ORAL_TABLET | Freq: Two times a day (BID) | ORAL | 0 refills | Status: AC
  Filled 2024-06-27: qty 20, 10d supply, fill #0

## 2024-06-27 MED ORDER — DEXAMETHASONE SODIUM PHOSPHATE 10 MG/ML IJ SOLN
INTRAMUSCULAR | Status: AC
  Filled 2024-06-27: qty 1

## 2024-06-27 MED ORDER — DEXAMETHASONE SODIUM PHOSPHATE 10 MG/ML IJ SOLN
10.0000 mg | Freq: Once | INTRAMUSCULAR | Status: AC
  Administered 2024-06-27: 10 mg via INTRAMUSCULAR

## 2024-06-27 MED ORDER — KETOROLAC TROMETHAMINE 60 MG/2ML IM SOLN
60.0000 mg | Freq: Once | INTRAMUSCULAR | Status: AC
  Administered 2024-06-27: 60 mg via INTRAMUSCULAR

## 2024-06-27 MED ORDER — KETOROLAC TROMETHAMINE 60 MG/2ML IM SOLN
INTRAMUSCULAR | Status: AC
  Filled 2024-06-27: qty 2

## 2024-06-27 MED ORDER — CYCLOBENZAPRINE HCL 10 MG PO TABS
10.0000 mg | ORAL_TABLET | Freq: Two times a day (BID) | ORAL | 0 refills | Status: AC
  Filled 2024-06-27: qty 20, 10d supply, fill #0

## 2024-06-27 NOTE — ED Triage Notes (Signed)
 Right shoulder pain and stiffness started 3 days ago. No injury or trauma to note.

## 2024-06-27 NOTE — ED Provider Notes (Signed)
 [facility]    CSN: [id number] Arrival date & time: 06/27/24  1001      History   Chief Complaint Chief Complaint  Patient presents with   Shoulder Pain    HPI Martha Alvarado Alvarado is a 58 y.o. female.   Discussed the use of AI scribe software for clinical note transcription with the patient, who gave verbal consent to proceed.   Martha Alvarado Alvarado is a 58 y.o. female  with right shoulder pain that began on Thursday night after receiving a cortisone shot in her knee earlier that day. The patient reports constant pain in her shoulder, which started while she was braiding her daughter's hair. The pain is localized to the shoulder and does not radiate into the neck. Martha Alvarado denies any numbness or tingling sensations. She reports difficulty moving her arm and specifically mentions that she cannot push up due to the pain. The patient, who is right-handed, states that the pain has significantly impacted her daily functioning, causing her to call out of work today. 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 Martha Alvarado Alvarado has attempted to alleviate the pain with ibuprofen , but reports no improvement. She denies any prior issues with her shoulder or any recent changes in her routine that might have contributed to the pain. The patient is currently taking Mounjaro  for weight loss.  The following portions of the patient's history were reviewed and updated as appropriate: allergies, current medications, past family history, past medical history, past social history, past surgical history, and problem list.    Past Medical History:  Diagnosis Date   CHLAMYDIAL INFECTION, HX OF 09/13/2007   Hyperlipidemia 05/20/2015   HYPERTENSION 09/13/2007   Impaired glucose tolerance 07/08/2011   SYPHILIS 09/13/2007    Patient Active Problem List   Diagnosis Date Noted   Bilateral knee effusions 10/09/2023   Skin tag 11/11/2022   Hypokalemia 06/06/2021   S/P lumbar  microdiscectomy 10/18/2020   Right carpal tunnel syndrome 06/08/2020   Low back pain with right-sided sciatica 06/08/2020   Vitamin D  deficiency 06/08/2020   Hyperlipidemia 05/20/2015   Menopause syndrome 05/08/2012   Early menopause 05/01/2012   Encounter for well adult exam with abnormal findings 07/09/2011   Diabetes (HCC) 07/08/2011   SYPHILIS 09/13/2007   OBESITY NOS 09/13/2007   Hypertension, uncontrolled 09/13/2007   CHLAMYDIAL INFECTION, HX OF 09/13/2007    Past Surgical History:  Procedure Laterality Date   LUMBAR LAMINECTOMY N/A 10/10/2020   Procedure: RIGHT L5-S1 MICRODISCECTOMY LUMBAR LAMINECTOMY;  Surgeon: Barbarann Oneil BROCKS, MD;  Location: [facility];  Service: Orthopedics;  Laterality: N/A;   TUBAL LIGATION  1991    OB History     Gravida  2   Para  2   Term  2   Preterm      AB      Living  2      SAB      IAB      Ectopic      Multiple      Live Births  2            Home Medications    Prior to Admission medications   Medication Sig Start Date End Date Taking? Authorizing Provider  aspirin EC 81 MG tablet Take 81 mg by mouth daily. Swallow whole.   Yes [provider]  Cholecalciferol  50 MCG (2000 UT) TABS 1 tab by mouth once daily 12/06/21  Yes Norleen Lynwood ORN,  MD  cyclobenzaprine  (FLEXERIL ) 10 MG tablet Take 1 tablet (10 mg total) by mouth 2 (two) times daily. 06/27/24  Yes Makendra Vigeant, FNP  naproxen (NAPROSYN) 500 MG tablet Take 1 tablet (500 mg total) by mouth 2 (two) times daily with a meal. 06/27/24  Yes Iola Lukes, FNP  Olmesartan -amLODIPine -HCTZ 40-5-12.5 MG TABS TAKE 1 TABLET BY MOUTH ONCE A DAY 10/07/23 10/06/24 Yes Norleen Lynwood ORN, MD  oxyCODONE  (ROXICODONE ) 5 MG immediate release tablet Take 1 tablet (5 mg total) by mouth every 4 (four) hours as needed for severe pain (pain score 7-10) or breakthrough pain. 06/27/24  Yes Iola Lukes, FNP  potassium chloride  (KLOR-CON ) 10 MEQ tablet TAKE 1 TABLET BY MOUTH EVERY  MORNING AND EVERY NIGHT AT BEDTIME 10/07/23 10/06/24 Yes Norleen Lynwood ORN, MD  rosuvastatin  (CRESTOR ) 40 MG tablet Take 1 tablet (40 mg total) by mouth daily. 10/07/23  Yes Norleen Lynwood ORN, MD  tirzepatide  (MOUNJARO ) 2.5 MG/0.5ML Pen Inject 2.5 mg into the skin once a week. E11.9 10/07/23  Yes Norleen Lynwood ORN, MD    Family History Family History  Problem Relation Age of Onset   Diabetes Mother    Diabetes Sister    Hypertension Sister    Hypertension Sister    Colon cancer Neg Hx    Esophageal cancer Neg Hx    Rectal cancer Neg Hx    Stomach cancer Neg Hx     Social History Social History   Tobacco Use   Smoking status: Never   Smokeless tobacco: Never  Vaping Use   Vaping status: Never Used  Substance Use Topics   Alcohol use: No   Drug use: Not Currently     Allergies   Patient has no known allergies.   Review of Systems Review of Systems  Musculoskeletal:  Positive for arthralgias. Negative for joint swelling and neck pain.  Neurological:  Negative for weakness and numbness.  All other systems reviewed and are negative.    Physical Exam Triage Vital Signs ED Triage Vitals [06/27/24 1011]  Encounter Vitals Group     BP (!) 160/99     Girls Systolic BP Percentile      Girls Diastolic BP Percentile      Boys Systolic BP Percentile      Boys Diastolic BP Percentile      Pulse Rate 85     Resp 18     Temp 97.8 F (36.6 C)     Temp Source Oral     SpO2 96 %     Weight      Height      Head Circumference      Peak Flow      Pain Score      Pain Loc      Pain Education      Exclude from Growth Chart    No data found.  Updated Vital Signs BP (!) 160/99 (BP Location: Left Arm)   Pulse 85   Temp 97.8 F (36.6 C) (Oral)   Resp 18   SpO2 96%   Visual Acuity Right Eye Distance:   Left Eye Distance:   Bilateral Distance:    Right Eye Near:   Left Eye Near:    Bilateral Near:     Physical Exam Vitals reviewed.  Constitutional:      General: She is not  in acute distress.    Appearance: Normal appearance. She is not toxic-appearing.  HENT:     Head: Normocephalic.  Mouth/Throat:     Mouth: Mucous membranes are moist.   Eyes:     Conjunctiva/sclera: Conjunctivae normal.    Cardiovascular:     Rate and Rhythm: Normal rate and regular rhythm.     Heart sounds: Normal heart sounds.  Pulmonary:     Effort: Pulmonary effort is normal.     Breath sounds: Normal breath sounds.   Musculoskeletal:     Right shoulder: Tenderness present. No swelling, deformity, effusion or crepitus. Decreased range of motion. Normal strength.     Right upper arm: Normal.     Right elbow: Normal.     Right forearm: Normal.     Right wrist: Normal.     Right hand: Normal.       Arms:   Skin:    General: Skin is warm and dry.   Neurological:     General: No focal deficit present.     Mental Status: She is alert and oriented to person, place, and time.     Sensory: Sensation is intact. No sensory deficit.     Motor: Motor function is intact. No weakness.     Coordination: Coordination is intact.     Gait: Gait is intact.      UC Treatments / Results  Labs (all labs ordered are listed, but only abnormal results are displayed) Labs Reviewed - No data to display  EKG   Radiology No results found.  Procedures Procedures (including critical care time)  Medications Ordered in UC Medications  dexamethasone  (DECADRON ) injection 10 mg (10 mg Intramuscular Given 06/27/24 1042)  ketorolac (TORADOL) injection 60 mg (60 mg Intramuscular Given 06/27/24 1043)    Initial Impression / Assessment and Plan / UC Course  I have reviewed the triage vital signs and the nursing notes.  Pertinent labs & imaging results that were available during my care of the patient were reviewed by me and considered in my medical decision making (see chart for details).    58 year old female presents with acute right shoulder pain that began the evening after a  cortisone injection was administered to her knee. 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 She denies trauma, prior shoulder issues, or neurological symptoms such as numbness or tingling. Physical exam suggests musculoskeletal strain or bursitis without evidence of dislocation or nerve involvement. Treatment initiated with intramuscular Decadron  and Toradol, and prescriptions given for Naproxen and Flexeril  twice daily with oxycodone  for breakthrough pain. Advised against the use of over-the-counter NSAIDs while on Naproxen. Ice and heat therapy instructions provided. Patient instructed to follow up with orthopedics if symptoms do not improve with treatment or worsen. Advised to return to the emergency department if she experiences numbness, tingling, or loss of function.  Today's evaluation has revealed no signs of a dangerous process. Discussed diagnosis with patient and/or guardian. Patient and/or guardian aware of their diagnosis, possible red flag symptoms to watch out for and need for close follow up. Patient and/or guardian understands verbal and written discharge instructions. Patient and/or guardian comfortable with plan and disposition.  Patient and/or guardian has a clear mental status at this time, good insight into illness (after discussion and teaching) and has clear judgment to make decisions regarding their care  Documentation was completed with the aid of voice recognition software. Transcription may contain typographical errors.  Final Clinical Impressions(s) / UC Diagnoses   Final diagnoses:  Acute pain of right shoulder  Discharge Instructions      You are being treated today for acute right shoulder pain. Your symptoms are likely due to inflammation or muscle strain. You were given an injection in clinic to help with pain and inflammation. You have also been prescribed Naproxen and Flexeril  to take  twice a day, and Oxycodone  as needed for more severe pain. While taking Naproxen, do not use any other over-the-counter anti-inflammatory medications like ibuprofen  or Advil . You may take Tylenol  for additional pain control if needed.  Apply ice to the shoulder during the first 1-2 days for 20 minutes every 1-2 hours, making sure to wrap the ice pack in a towel to protect your skin. After the first couple of days, you may alternate between ice and heat as tolerated to help relieve stiffness and discomfort. Rest the shoulder as much as possible and avoid heavy lifting or movements that worsen the pain.  If your symptoms do not improve after completing your prescribed treatment or begin to interfere further with daily activities, follow up with an orthopedic specialist. Go to the emergency department if you develop numbness, tingling, weakness, or loss of sensation in your arm or hand.      ED Prescriptions     Medication Sig Dispense Auth. Provider   naproxen (NAPROSYN) 500 MG tablet Take 1 tablet (500 mg total) by mouth 2 (two) times daily with a meal. 20 tablet Iola Lukes, FNP   cyclobenzaprine  (FLEXERIL ) 10 MG tablet Take 1 tablet (10 mg total) by mouth 2 (two) times daily. 20 tablet Iola Lukes, FNP   oxyCODONE  (ROXICODONE ) 5 MG immediate release tablet Take 1 tablet (5 mg total) by mouth every 4 (four) hours as needed for severe pain (pain score 7-10) or breakthrough pain. 10 tablet Iola Lukes, FNP      I have reviewed the PDMP during this encounter.   Iola Lukes, [place] 06/27/24 [id number]

## 2024-06-27 NOTE — Discharge Instructions (Addendum)
 You are being treated today for acute right shoulder pain. Your symptoms are likely due to inflammation or muscle strain. You were given an injection in clinic to help with pain and inflammation. You have also been prescribed Naproxen and Flexeril  to take twice a day, and Oxycodone  as needed for more severe pain. While taking Naproxen, do not use any other over-the-counter anti-inflammatory medications like ibuprofen  or Advil . You may take Tylenol  for additional pain control if needed.  Apply ice to the shoulder during the first 1-2 days for 20 minutes every 1-2 hours, making sure to wrap the ice pack in a towel to protect your skin. After the first couple of days, you may alternate between ice and heat as tolerated to help relieve stiffness and discomfort. Rest the shoulder as much as possible and avoid heavy lifting or movements that worsen the pain.  If your symptoms do not improve after completing your prescribed treatment or begin to interfere further with daily activities, follow up with an orthopedic specialist. Go to the emergency department if you develop numbness, tingling, weakness, or loss of sensation in your arm or hand.

## 2024-07-11 ENCOUNTER — Other Ambulatory Visit (HOSPITAL_COMMUNITY): Payer: Self-pay

## 2024-07-20 ENCOUNTER — Ambulatory Visit: Admitting: Orthopaedic Surgery

## 2024-08-10 ENCOUNTER — Encounter: Payer: Self-pay | Admitting: Orthopaedic Surgery

## 2024-08-10 ENCOUNTER — Ambulatory Visit: Admitting: Orthopaedic Surgery

## 2024-08-10 VITALS — Ht 65.5 in | Wt 214.0 lb

## 2024-08-10 DIAGNOSIS — M1712 Unilateral primary osteoarthritis, left knee: Secondary | ICD-10-CM | POA: Diagnosis not present

## 2024-08-10 DIAGNOSIS — M25561 Pain in right knee: Secondary | ICD-10-CM | POA: Diagnosis not present

## 2024-08-10 DIAGNOSIS — G8929 Other chronic pain: Secondary | ICD-10-CM

## 2024-08-10 DIAGNOSIS — M25562 Pain in left knee: Secondary | ICD-10-CM

## 2024-08-10 DIAGNOSIS — M1711 Unilateral primary osteoarthritis, right knee: Secondary | ICD-10-CM

## 2024-08-10 NOTE — Progress Notes (Signed)
 The patient is a very pleasant and active 58 year old who was sent to me from Dr. 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  She has been seeing him for a long period of time and has had injections and aspirations from mainly her left knee.  She had this again in June but she has had a reaccumulation of fluid and she comes in today to discuss knee replacement surgery.  She does work in dialysis.  She is considering having her knee replaced and November for her left knee.  She is heading back to school and some other training I believe in April of next year.  She is not a diabetic.  She is minimal weight loss journey and her BMI is down to 35.07.  I was able to review all of her medications and past medical history within epic.  At this point her left knee pain is daily and it is detrimentally vetting her mobility, her quality of life and her actives daily living.  On examination her left knee has some valgus malalignment with a large joint effusion today and pain throughout the arc of motion of her knee.  Her right knee has more varus malalignment but no significant effusion today and less pain.  The pain on both knees is global.  There is a lot of patellofemoral crepitation as well.  That her x-rays are reviewed with her from 10 months ago and it shows even then significant arthritis involving all 3 compartments of the left knee.  We had a long and thorough discussion about knee replacement surgery.  I showed her knee replacement model and went over the surgery in detail.  We discussed the risk and benefits of surgery and what to expect from an intraoperative and postoperative standpoint.  She would likely need to be out of work for up to 3 months depending on her recovery in terms of pain control and mobility and working with therapy.  All questions and concerns were answered addressed.  She would like to consider having her left knee replaced in  November.  Will be in touch with scheduling the surgery.

## 2024-08-11 ENCOUNTER — Other Ambulatory Visit (HOSPITAL_COMMUNITY): Payer: Self-pay

## 2024-09-09 ENCOUNTER — Other Ambulatory Visit: Payer: Self-pay

## 2024-09-13 ENCOUNTER — Other Ambulatory Visit (HOSPITAL_COMMUNITY): Payer: Self-pay

## 2024-09-15 ENCOUNTER — Telehealth: Payer: Self-pay

## 2024-09-15 NOTE — Telephone Encounter (Signed)
 Pharmacy Patient Advocate Encounter   Received notification from CoverMyMeds that prior authorization for Mounjaro  2.5MG /0.5ML auto-injectors is due for renewal.   Insurance verification completed.   The patient is insured through [employer].  Action: PA required; PA submitted to above mentioned insurance via Latent Key/confirmation #/EOC [id number] Status is pending

## 2024-09-17 ENCOUNTER — Other Ambulatory Visit (HOSPITAL_COMMUNITY): Payer: Self-pay

## 2024-09-23 NOTE — Telephone Encounter (Signed)
 Pharmacy Patient Advocate Encounter  Received notification from [employer] that Prior Authorization for Mounjaro  2.5MG /0.5ML auto-injectors  has been APPROVED from 08/24/2024 to 09/23/2025   PA #/Case ID/Reference #: [id number]

## 2024-10-10 ENCOUNTER — Other Ambulatory Visit (HOSPITAL_COMMUNITY): Payer: Self-pay

## 2024-10-16 ENCOUNTER — Other Ambulatory Visit: Payer: Self-pay | Admitting: Internal Medicine

## 2024-10-19 ENCOUNTER — Other Ambulatory Visit: Payer: Self-pay

## 2024-10-19 ENCOUNTER — Ambulatory Visit: Admitting: Family Medicine

## 2024-10-19 ENCOUNTER — Other Ambulatory Visit (HOSPITAL_COMMUNITY): Payer: Self-pay

## 2024-10-19 VITALS — BP 138/88 | HR 63 | Ht 65.5 in

## 2024-10-19 DIAGNOSIS — M25561 Pain in right knee: Secondary | ICD-10-CM

## 2024-10-19 DIAGNOSIS — M170 Bilateral primary osteoarthritis of knee: Secondary | ICD-10-CM | POA: Diagnosis not present

## 2024-10-19 DIAGNOSIS — G8929 Other chronic pain: Secondary | ICD-10-CM | POA: Diagnosis not present

## 2024-10-19 MED ORDER — ROSUVASTATIN CALCIUM 40 MG PO TABS
40.0000 mg | ORAL_TABLET | Freq: Every day | ORAL | 3 refills | Status: AC
  Filled 2024-10-19: qty 30, 30d supply, fill #0
  Filled 2024-11-15 – 2024-11-20 (×2): qty 30, 30d supply, fill #1
  Filled 2024-12-25 – 2024-12-27 (×2): qty 30, 30d supply, fill #2
  Filled 2025-01-22: qty 30, 30d supply, fill #3

## 2024-10-19 MED ORDER — POTASSIUM CHLORIDE ER 10 MEQ PO TBCR
10.0000 meq | EXTENDED_RELEASE_TABLET | Freq: Two times a day (BID) | ORAL | 3 refills | Status: AC
  Filled 2024-10-19: qty 60, 30d supply, fill #0
  Filled 2024-11-15: qty 180, 90d supply, fill #0
  Filled 2024-11-20: qty 60, 30d supply, fill #0
  Filled 2024-12-25 – 2024-12-27 (×2): qty 60, 30d supply, fill #1
  Filled 2025-01-22: qty 60, 30d supply, fill #2

## 2024-10-19 MED ORDER — OLMESARTAN-AMLODIPINE-HCTZ 40-5-12.5 MG PO TABS
1.0000 | ORAL_TABLET | Freq: Every day | ORAL | 3 refills | Status: AC
  Filled 2024-10-19: qty 30, 30d supply, fill #0
  Filled 2024-11-15 – 2024-11-20 (×2): qty 30, 30d supply, fill #1
  Filled 2024-12-25 – 2024-12-27 (×2): qty 30, 30d supply, fill #2
  Filled 2025-01-22: qty 30, 30d supply, fill #3

## 2024-10-19 MED ORDER — METHYLPREDNISOLONE ACETATE 40 MG/ML IJ SUSP
40.0000 mg | Freq: Once | INTRAMUSCULAR | Status: AC
  Administered 2024-10-19: 40 mg via INTRA_ARTICULAR

## 2024-10-19 MED ORDER — MOUNJARO 2.5 MG/0.5ML ~~LOC~~ SOAJ
2.5000 mg | SUBCUTANEOUS | 11 refills | Status: AC
  Filled 2024-10-19 – 2024-11-20 (×3): qty 2, 28d supply, fill #0

## 2024-10-19 NOTE — Progress Notes (Signed)
   LILLETTE Ileana Collet, PhD, LAT, ATC acting as a scribe for Artist Lloyd, MD.  RAGEN LAVER is a 58 y.o. female who presents to [facility] today for cont'd R knee pain. Pt was last seen by Dr. Lloyd on 06/25/24 and her L knee was injected w/ Zilretta .  Pt completed the Gelsyn series, 3/3, bilaterally on 04/30/24.  Today, pt reports L TKR scheduled for 11/11. R knee pain got really bad last night. She notes she did go bowling last night.   Dx imaging: 10/09/23 R & L knee XR   Pertinent review of systems: No fevers or chills  Relevant historical information: Scheduled left total knee replacement November 11   Exam:  BP 138/88   Pulse 63   Ht 5' 5.5 (1.664 m)   SpO2 98%   BMI 35.07 kg/m  General: Well Developed, well nourished, and in no acute distress.   MSK: Right knee moderate effusion decreased range of motion.  Intact strength    Lab and Radiology Results  Procedure: Real-time Ultrasound Guided Injection of right knee joint superior lateral patella space Device: Philips Affiniti 50G/GE Logiq Images permanently stored and available for review in PACS Verbal informed consent obtained.  Discussed risks and benefits of procedure. Warned about infection, bleeding, hyperglycemia damage to structures among others. Patient expresses understanding and agreement Time-out conducted.   Noted no overlying erythema, induration, or other signs of local infection.   Skin prepped in a sterile fashion.   Local anesthesia: Topical Ethyl chloride.   With sterile technique and under real time ultrasound guidance: 40 mg of Depo-Medrol  and 2 mL of Marcaine  injected into knee joint. Fluid seen entering the joint capsule.   Completed without difficulty   Pain immediately resolved suggesting accurate placement of the medication.   Advised to call if fevers/chills, erythema, induration, drainage, or persistent bleeding.   Images permanently stored and available for  review in the ultrasound unit.  Impression: Technically successful ultrasound guided injection.        Assessment and Plan: 58 y.o. 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  Plan for steroid injection today. Patient is scheduled to have a left total knee replacement in a few weeks.  PDMP not reviewed this encounter. Orders Placed This Encounter  Procedures   US  LIMITED JOINT SPACE STRUCTURES LOW RIGHT(NO LINKED CHARGES)    Reason for Exam (SYMPTOM  OR DIAGNOSIS REQUIRED):   right knee pain    Preferred imaging location?:   [facility] [place] [facility] ordered this encounter  Medications   methylPREDNISolone  acetate (DEPO-MEDROL ) injection 40 mg     Discussed warning signs or symptoms. Please see discharge instructions. Patient expresses understanding.   The above documentation has been reviewed and is accurate and complete Artist Lloyd, M.D.

## 2024-10-19 NOTE — Patient Instructions (Addendum)
Thank you for coming in today.   You received an injection today. Seek immediate medical attention if the joint becomes red, extremely painful, or is oozing fluid.   Good luck with your upcoming surgery!

## 2024-10-28 ENCOUNTER — Other Ambulatory Visit: Payer: Self-pay | Admitting: Physician Assistant

## 2024-10-28 DIAGNOSIS — Z01818 Encounter for other preprocedural examination: Secondary | ICD-10-CM

## 2024-10-29 ENCOUNTER — Other Ambulatory Visit (HOSPITAL_COMMUNITY): Payer: Self-pay

## 2024-11-02 ENCOUNTER — Encounter: Payer: Self-pay | Admitting: Radiology

## 2024-11-02 ENCOUNTER — Telehealth: Payer: Self-pay | Admitting: Orthopaedic Surgery

## 2024-11-02 NOTE — Telephone Encounter (Signed)
 Pt submitted FMLA forms, medical release form and 20 payment

## 2024-11-03 NOTE — Telephone Encounter (Signed)
 Received

## 2024-11-06 ENCOUNTER — Encounter: Payer: Self-pay | Admitting: Orthopaedic Surgery

## 2024-11-06 NOTE — Telephone Encounter (Signed)
 I called patient and advised surgery cancelled since insurance denied.  See referral message for surgery.  I gave her number in message to call insurance.  She will follow up with them.

## 2024-11-10 ENCOUNTER — Ambulatory Visit (HOSPITAL_COMMUNITY): Admission: RE | Admit: 2024-11-10 | Source: Home / Self Care | Admitting: Orthopaedic Surgery

## 2024-11-10 ENCOUNTER — Encounter (HOSPITAL_COMMUNITY): Admission: RE | Payer: Self-pay | Source: Home / Self Care

## 2024-11-10 SURGERY — ARTHROPLASTY, KNEE, TOTAL
Anesthesia: Spinal | Site: Knee | Laterality: Left

## 2024-11-15 ENCOUNTER — Other Ambulatory Visit (HOSPITAL_COMMUNITY): Payer: Self-pay

## 2024-11-20 ENCOUNTER — Other Ambulatory Visit (HOSPITAL_COMMUNITY): Payer: Self-pay

## 2024-11-23 ENCOUNTER — Other Ambulatory Visit: Payer: Self-pay

## 2024-11-23 ENCOUNTER — Encounter: Admitting: Orthopaedic Surgery

## 2024-11-23 ENCOUNTER — Other Ambulatory Visit (HOSPITAL_COMMUNITY): Payer: Self-pay

## 2024-11-24 ENCOUNTER — Other Ambulatory Visit (HOSPITAL_COMMUNITY): Payer: Self-pay

## 2024-11-24 ENCOUNTER — Telehealth: Payer: Self-pay | Admitting: Internal Medicine

## 2024-11-24 DIAGNOSIS — M25562 Pain in left knee: Secondary | ICD-10-CM | POA: Insufficient documentation

## 2024-11-24 NOTE — Telephone Encounter (Signed)
 [facility] faxed document Surgical Clearance, to be filled out by provider. Patient requested to send it back via Fax within 7-days. Document is located in providers tray at front office.Please advise at [facility] [phone]

## 2024-11-24 NOTE — Telephone Encounter (Signed)
 Form has been placed on provider desk.

## 2024-12-01 NOTE — Telephone Encounter (Signed)
 Form will be filled out when patient is seen on 12/08/24.

## 2024-12-08 ENCOUNTER — Encounter: Admitting: Internal Medicine

## 2024-12-25 ENCOUNTER — Other Ambulatory Visit (HOSPITAL_COMMUNITY): Payer: Self-pay

## 2024-12-26 ENCOUNTER — Other Ambulatory Visit (HOSPITAL_COMMUNITY): Payer: Self-pay

## 2024-12-27 ENCOUNTER — Other Ambulatory Visit (HOSPITAL_COMMUNITY): Payer: Self-pay

## 2024-12-29 ENCOUNTER — Encounter: Admitting: Internal Medicine

## 2025-01-01 ENCOUNTER — Encounter: Payer: Self-pay | Admitting: Internal Medicine

## 2025-01-07 ENCOUNTER — Ambulatory Visit: Payer: Self-pay | Admitting: Internal Medicine

## 2025-01-07 ENCOUNTER — Ambulatory Visit: Admitting: Internal Medicine

## 2025-01-07 ENCOUNTER — Encounter: Payer: Self-pay | Admitting: Internal Medicine

## 2025-01-07 VITALS — BP 128/76 | HR 55 | Temp 98.5°F | Ht 65.5 in | Wt 215.0 lb

## 2025-01-07 DIAGNOSIS — Z0000 Encounter for general adult medical examination without abnormal findings: Secondary | ICD-10-CM

## 2025-01-07 DIAGNOSIS — Z01818 Encounter for other preprocedural examination: Secondary | ICD-10-CM | POA: Insufficient documentation

## 2025-01-07 DIAGNOSIS — I10 Essential (primary) hypertension: Secondary | ICD-10-CM | POA: Diagnosis not present

## 2025-01-07 DIAGNOSIS — Z0001 Encounter for general adult medical examination with abnormal findings: Secondary | ICD-10-CM

## 2025-01-07 DIAGNOSIS — E7800 Pure hypercholesterolemia, unspecified: Secondary | ICD-10-CM

## 2025-01-07 DIAGNOSIS — E1165 Type 2 diabetes mellitus with hyperglycemia: Secondary | ICD-10-CM

## 2025-01-07 DIAGNOSIS — E559 Vitamin D deficiency, unspecified: Secondary | ICD-10-CM | POA: Diagnosis not present

## 2025-01-07 DIAGNOSIS — Z7985 Long-term (current) use of injectable non-insulin antidiabetic drugs: Secondary | ICD-10-CM | POA: Diagnosis not present

## 2025-01-07 LAB — CBC WITH DIFFERENTIAL/PLATELET
Basophils Absolute: 0 K/uL (ref 0.0–0.1)
Basophils Relative: 0.4 % (ref 0.0–3.0)
Eosinophils Absolute: 0.1 K/uL (ref 0.0–0.7)
Eosinophils Relative: 2.3 % (ref 0.0–5.0)
HCT: 40 % (ref 36.0–46.0)
Hemoglobin: 13.4 g/dL (ref 12.0–15.0)
Lymphocytes Relative: 41.2 % (ref 12.0–46.0)
Lymphs Abs: 1.8 K/uL (ref 0.7–4.0)
MCHC: 33.5 g/dL (ref 30.0–36.0)
MCV: 82.9 fl (ref 78.0–100.0)
Monocytes Absolute: 0.4 K/uL (ref 0.1–1.0)
Monocytes Relative: 9.5 % (ref 3.0–12.0)
Neutro Abs: 2 K/uL (ref 1.4–7.7)
Neutrophils Relative %: 46.6 % (ref 43.0–77.0)
Platelets: 234 K/uL (ref 150.0–400.0)
RBC: 4.83 Mil/uL (ref 3.87–5.11)
RDW: 13.6 % (ref 11.5–15.5)
WBC: 4.3 K/uL (ref 4.0–10.5)

## 2025-01-07 LAB — LIPID PANEL
Cholesterol: 124 mg/dL (ref 28–200)
HDL: 58.7 mg/dL
LDL Cholesterol: 54 mg/dL (ref 10–99)
NonHDL: 64.99
Total CHOL/HDL Ratio: 2
Triglycerides: 53 mg/dL (ref 10.0–149.0)
VLDL: 10.6 mg/dL (ref 0.0–40.0)

## 2025-01-07 LAB — TSH: TSH: 1.55 u[IU]/mL (ref 0.35–5.50)

## 2025-01-07 LAB — HEMOGLOBIN A1C: Hgb A1c MFr Bld: 5.8 % (ref 4.6–6.5)

## 2025-01-07 LAB — URINALYSIS, ROUTINE W REFLEX MICROSCOPIC
Bilirubin Urine: NEGATIVE
Hgb urine dipstick: NEGATIVE
Ketones, ur: NEGATIVE
Leukocytes,Ua: NEGATIVE
Nitrite: NEGATIVE
RBC / HPF: NONE SEEN
Specific Gravity, Urine: 1.01 (ref 1.000–1.030)
Total Protein, Urine: NEGATIVE
Urine Glucose: NEGATIVE
Urobilinogen, UA: 0.2 (ref 0.0–1.0)
WBC, UA: NONE SEEN
pH: 7 (ref 5.0–8.0)

## 2025-01-07 LAB — MICROALBUMIN / CREATININE URINE RATIO
Creatinine,U: 46.6 mg/dL
Microalb Creat Ratio: UNDETERMINED mg/g (ref 0.0–30.0)
Microalb, Ur: <0.7 mg/dL

## 2025-01-07 LAB — BASIC METABOLIC PANEL WITH GFR
BUN: 20 mg/dL (ref 6–23)
CO2: 31 meq/L (ref 19–32)
Calcium: 9.5 mg/dL (ref 8.4–10.5)
Chloride: 103 meq/L (ref 96–112)
Creatinine, Ser: 1.02 mg/dL (ref 0.40–1.20)
GFR: 60.84 mL/min
Glucose, Bld: 86 mg/dL (ref 70–99)
Potassium: 3.3 meq/L — ABNORMAL LOW (ref 3.5–5.1)
Sodium: 141 meq/L (ref 135–145)

## 2025-01-07 LAB — HEPATIC FUNCTION PANEL
ALT: 18 U/L (ref 3–35)
AST: 17 U/L (ref 5–37)
Albumin: 4.3 g/dL (ref 3.5–5.2)
Alkaline Phosphatase: 78 U/L (ref 39–117)
Bilirubin, Direct: 0.1 mg/dL (ref 0.1–0.3)
Total Bilirubin: 0.6 mg/dL (ref 0.2–1.2)
Total Protein: 6.8 g/dL (ref 6.0–8.3)

## 2025-01-07 NOTE — Assessment & Plan Note (Signed)
 BP Readings from Last 3 Encounters:  01/07/25 128/76  10/19/24 138/88  06/27/24 (!) 160/99   Stable, pt to continue medical treatment tribenzor 40 - 5 - 12.5 qd

## 2025-01-07 NOTE — Assessment & Plan Note (Signed)
 Age and sex appropriate education and counseling updated with regular exercise and diet Referrals for preventative services - none needed Immunizations addressed - declines hep b and covid booster and prevnar Smoking counseling  - none needed Evidence for depression or other mood disorder - none significant Most recent labs reviewed. I have personally reviewed and have noted: 1) the patient's medical and social history 2) The patient's current medications and supplements 3) The patient's height, weight, and BMI have been recorded in the chart

## 2025-01-07 NOTE — Progress Notes (Signed)
 Patient ID: Martha Alvarado, female   DOB: 06-09-1966, 59 y.o.   MRN: [id number]         Chief Complaint:: wellness exam and Annual Exam (& surgical clearance )  , low vit d, htn, hld, dm       HPI:  Martha Alvarado is a 59 y.o. female here for wellness exam; declines prevnar 20 and hep b vax for now,  op/w up to date                        Also Pt denies chest pain, increased sob or doe, wheezing, orthopnea, PND, increased LE swelling, palpitations, dizziness or syncope.   Pt denies polydipsia, polyuria, or new focal neuro s/s.    Pt denies fever, wt loss, night sweats, loss of appetite, or other constitutional symptoms    Wt Readings from Last 3 Encounters:  01/07/25 215 lb (97.5 kg)  08/10/24 214 lb (97.1 kg)  06/25/24 214 lb (97.1 kg)   BP Readings from Last 3 Encounters:  01/07/25 128/76  10/19/24 138/88  06/27/24 (!) 160/99   Immunization History  Administered Date(s) Administered   Influenza, Seasonal, Injecte, Preservative Fre 10/07/2023   Influenza,inj,Quad PF,6+ Mos 10/11/2020, 12/06/2021, 10/18/2022, 10/19/2024   PFIZER(Purple Top)SARS-COV-2 Vaccination 12/30/2019, 01/18/2020, 12/19/2020, 10/17/2021   Td 04/26/2009   Tdap 06/25/2019   Zoster Recombinant(Shingrix ) 10/18/2022, 12/21/2022   Health Maintenance Due  Topic Date Due   Pneumococcal Vaccine: 50+ Years (1 of 2 - PCV) Never done   Hepatitis B Vaccines 19-59 Average Risk (1 of 3 - 19+ 3-dose series) Never done   COVID-19 Vaccine (5 - 2025-26 season) 08/31/2024      Past Medical History:  Diagnosis Date   CHLAMYDIAL INFECTION, HX OF 09/13/2007   Hyperlipidemia 05/20/2015   HYPERTENSION 09/13/2007   Impaired glucose tolerance 07/08/2011   SYPHILIS 09/13/2007   Past Surgical History:  Procedure Laterality Date   LUMBAR LAMINECTOMY N/A 10/10/2020   Procedure: RIGHT L5-S1 MICRODISCECTOMY LUMBAR LAMINECTOMY;  Surgeon: Barbarann Oneil BROCKS, MD;  Location: [facility] OR;  Service: Orthopedics;  Laterality: N/A;   TUBAL LIGATION   1991    reports that she has never smoked. She has never used smokeless tobacco. She reports that she does not currently use drugs. She reports that she does not drink alcohol. family history includes Diabetes in her mother and sister; Hypertension in her sister and sister. Allergies[1] Medications Ordered Prior to Encounter[2]      ROS:  All others reviewed and negative.  Objective        PE:  BP 128/76 (BP Location: Right Arm, Patient Position: Sitting, Cuff Size: Normal)   Pulse (!) 55   Temp 98.5 F (36.9 C) (Oral)   Ht 5' 5.5 (1.664 m)   Wt 215 lb (97.5 kg)   SpO2 98%   BMI 35.23 kg/m                 Constitutional: Pt appears in NAD               HENT: Head: NCAT.                Right Ear: External ear normal.                 Left Ear: External ear normal.                Eyes: . Pupils are equal, round, and reactive to light. Conjunctivae and  EOM are normal               Nose: without d/c or deformity               Neck: Neck supple. Gross normal ROM               Cardiovascular: Normal rate and regular rhythm.                 Pulmonary/Chest: Effort normal and breath sounds without rales or wheezing.                Abd:  Soft, NT, ND, + BS, no organomegaly               Neurological: Pt is alert. At baseline orientation, motor grossly intact               Skin: Skin is warm. No rashes, no other new lesions, LE edema - none               Psychiatric: Pt behavior is normal without agitation   Micro: none  Cardiac tracings I have personally interpreted today:  none  Pertinent Radiological findings (summarize): none   Lab Results  Component Value Date   WBC 4.3 01/07/2025   HGB 13.4 01/07/2025   HCT 40.0 01/07/2025   PLT 234.0 01/07/2025   GLUCOSE 86 01/07/2025   CHOL 124 01/07/2025   TRIG 53.0 01/07/2025   HDL 58.70 01/07/2025   LDLCALC 54 01/07/2025   ALT 18 01/07/2025   AST 17 01/07/2025   NA 141 01/07/2025   K 3.3 (L) 01/07/2025   CL 103 01/07/2025    CREATININE 1.02 01/07/2025   BUN 20 01/07/2025   CO2 31 01/07/2025   TSH 1.55 01/07/2025   HGBA1C 5.8 01/07/2025   MICROALBUR <0.7 01/07/2025   Assessment/Plan:  Martha Alvarado is a 59 y.o. Black or African American [2] female with  has a past medical history of CHLAMYDIAL INFECTION, HX OF (09/13/2007), Hyperlipidemia (05/20/2015), HYPERTENSION (09/13/2007), Impaired glucose tolerance (07/08/2011), and SYPHILIS (09/13/2007).  Diabetes (HCC) With hyperglycemia  Lab Results  Component Value Date   HGBA1C 5.8 01/07/2025   Stable, pt to continue current medical treatment mounjaro  2.5 mg weekly     Encounter for well adult exam with abnormal findings Age and sex appropriate education and counseling updated with regular exercise and diet Referrals for preventative services - none needed Immunizations addressed - declines hep b and covid booster and prevnar Smoking counseling  - none needed Evidence for depression or other mood disorder - none significant Most recent labs reviewed. I have personally reviewed and have noted: 1) the patient's medical and social history 2) The patient's current medications and supplements 3) The patient's height, weight, and BMI have been recorded in the chart   Vitamin D  deficiency Last vitamin D  Lab Results  Component Value Date   VD25OH 29.40 (L) 10/07/2023   Low, to start oral replacement   Hypertension, uncontrolled BP Readings from Last 3 Encounters:  01/07/25 128/76  10/19/24 138/88  06/27/24 (!) 160/99   Stable, pt to continue medical treatment tribenzor 40 - 5 - 12.5 qd   Hyperlipidemia Lab Results  Component Value Date   LDLCALC 54 01/07/2025   Stable, pt to continue current statin crestor  40 mg qd   Preop exam for internal medicine Pt cleared for surgury, form signed and faxed  Followup: Return in about 6 months (around 07/07/2025).  Lynwood Rush, MD 01/07/2025  8:36 PM [facility] Internal Medicine     [1] No Known Allergies [2]  Current Outpatient Medications on File Prior to Visit  Medication Sig Dispense Refill   aspirin EC 81 MG tablet Take 81 mg by mouth daily. Swallow whole.     Cholecalciferol  50 MCG (2000 UT) TABS 1 tab by mouth once daily 30 tablet 99   cyclobenzaprine  (FLEXERIL ) 10 MG tablet Take 1 tablet (10 mg total) by mouth 2 (two) times daily. 20 tablet 0   naproxen  (NAPROSYN ) 500 MG tablet Take 1 tablet (500 mg total) by mouth 2 (two) times daily with a meal. 20 tablet 0   Olmesartan -amLODIPine -HCTZ 40-5-12.5 MG TABS TAKE 1 TABLET BY MOUTH ONCE A DAY 90 tablet 3   oxyCODONE  (ROXICODONE ) 5 MG immediate release tablet Take 1 tablet (5 mg total) by mouth every 4 (four) hours as needed for severe pain (pain score 7-10) or breakthrough pain. 10 tablet 0   potassium chloride  (KLOR-CON ) 10 MEQ tablet TAKE 1 TABLET BY MOUTH EVERY MORNING AND EVERY NIGHT AT BEDTIME 180 tablet 3   rosuvastatin  (CRESTOR ) 40 MG tablet Take 1 tablet (40 mg total) by mouth daily. 90 tablet 3   tirzepatide  (MOUNJARO ) 2.5 MG/0.5ML Pen Inject 2.5 mg into the skin once a week. E11.9 2 mL 11   No current facility-administered medications on file prior to visit.

## 2025-01-07 NOTE — Patient Instructions (Signed)
 Please continue all other medications as before, and refills have been done if requested.  Please have the pharmacy call with any other refills you may need.  Please continue your efforts at being more active, low cholesterol diet, and weight control.  You are otherwise up to date with prevention measures today.  Please keep your appointments with your specialists as you may have planned  Your form should be signed off today for the clearance for surgury  Please go to the LAB at the blood drawing area for the tests to be done  You will be contacted by phone if any changes need to be made immediately.  Otherwise, you will receive a letter about your results with an explanation, but please check with MyChart first.  Please make an Appointment to return in 6 months, or sooner if needed

## 2025-01-07 NOTE — Assessment & Plan Note (Addendum)
 With hyperglycemia  Lab Results  Component Value Date   HGBA1C 5.8 01/07/2025   Stable, pt to continue current medical treatment mounjaro  2.5 mg weekly

## 2025-01-07 NOTE — Assessment & Plan Note (Signed)
Last vitamin D Lab Results  Component Value Date   VD25OH 29.40 (L) 10/07/2023   Low, to start oral replacement

## 2025-01-07 NOTE — Assessment & Plan Note (Signed)
 Pt cleared for surgury, form signed and faxed

## 2025-01-07 NOTE — Assessment & Plan Note (Signed)
 Lab Results  Component Value Date   LDLCALC 54 01/07/2025   Stable, pt to continue current statin crestor  40 mg qd

## 2025-01-22 ENCOUNTER — Other Ambulatory Visit: Payer: Self-pay

## 2025-01-29 ENCOUNTER — Other Ambulatory Visit (HOSPITAL_COMMUNITY): Payer: Self-pay

## 2025-01-29 MED ORDER — ASPIRIN 81 MG PO CHEW
81.0000 mg | CHEWABLE_TABLET | Freq: Two times a day (BID) | ORAL | 0 refills | Status: AC
  Filled 2025-01-29: qty 60, 30d supply, fill #0

## 2025-01-29 MED ORDER — CYCLOBENZAPRINE HCL 5 MG PO TABS
5.0000 mg | ORAL_TABLET | Freq: Three times a day (TID) | ORAL | 1 refills | Status: AC | PRN
  Filled 2025-01-29: qty 30, 10d supply, fill #0

## 2025-01-29 MED ORDER — OXYCODONE HCL 5 MG PO TABS
5.0000 mg | ORAL_TABLET | ORAL | 0 refills | Status: AC | PRN
  Filled 2025-01-29: qty 40, 7d supply, fill #0

## 2025-01-29 MED ORDER — ONDANSETRON 4 MG PO TBDP
4.0000 mg | ORAL_TABLET | Freq: Three times a day (TID) | ORAL | 0 refills | Status: AC | PRN
  Filled 2025-01-29: qty 9, 3d supply, fill #0

## 2025-01-29 MED ORDER — CELECOXIB 100 MG PO CAPS
100.0000 mg | ORAL_CAPSULE | Freq: Two times a day (BID) | ORAL | 0 refills | Status: AC
  Filled 2025-01-29: qty 28, 14d supply, fill #0
